# Patient Record
Sex: Male | Born: 1935 | Race: White | Hispanic: No | Marital: Single | Smoking: Never smoker
Health system: Southern US, Community
[De-identification: ages and names within clinical notes are randomized; demographics above are authoritative.]

## PROBLEM LIST (undated history)

## (undated) DIAGNOSIS — R0600 Dyspnea, unspecified: Secondary | ICD-10-CM

## (undated) DIAGNOSIS — R4182 Altered mental status, unspecified: Secondary | ICD-10-CM

## (undated) DIAGNOSIS — N390 Urinary tract infection, site not specified: Secondary | ICD-10-CM

## (undated) HISTORY — PX: HERNIA REPAIR: SHX51

---

## 2003-10-14 ENCOUNTER — Emergency Department (HOSPITAL_COMMUNITY): Admission: EM | Admit: 2003-10-14 | Discharge: 2003-10-14 | Payer: Self-pay | Admitting: Family Medicine

## 2004-03-20 ENCOUNTER — Emergency Department (HOSPITAL_COMMUNITY): Admission: EM | Admit: 2004-03-20 | Discharge: 2004-03-20 | Payer: Self-pay | Admitting: Family Medicine

## 2009-05-16 ENCOUNTER — Emergency Department (HOSPITAL_COMMUNITY): Admission: EM | Admit: 2009-05-16 | Discharge: 2009-05-16 | Payer: Self-pay | Admitting: Emergency Medicine

## 2009-12-15 ENCOUNTER — Emergency Department (HOSPITAL_COMMUNITY): Admission: EM | Admit: 2009-12-15 | Discharge: 2009-12-15 | Payer: Self-pay | Admitting: Emergency Medicine

## 2009-12-15 ENCOUNTER — Emergency Department (HOSPITAL_COMMUNITY): Admission: EM | Admit: 2009-12-15 | Discharge: 2009-12-15 | Payer: Self-pay | Admitting: Family Medicine

## 2010-01-04 ENCOUNTER — Ambulatory Visit: Payer: Self-pay | Admitting: Internal Medicine

## 2010-01-04 ENCOUNTER — Encounter: Payer: Self-pay | Admitting: Internal Medicine

## 2010-01-04 DIAGNOSIS — R9431 Abnormal electrocardiogram [ECG] [EKG]: Secondary | ICD-10-CM

## 2010-01-04 DIAGNOSIS — H9190 Unspecified hearing loss, unspecified ear: Secondary | ICD-10-CM | POA: Insufficient documentation

## 2010-01-04 DIAGNOSIS — N401 Enlarged prostate with lower urinary tract symptoms: Secondary | ICD-10-CM

## 2010-01-04 DIAGNOSIS — J984 Other disorders of lung: Secondary | ICD-10-CM | POA: Insufficient documentation

## 2010-01-04 LAB — CONVERTED CEMR LAB
ALT: 15 units/L (ref 0–53)
AST: 26 units/L (ref 0–37)
Alkaline Phosphatase: 50 units/L (ref 39–117)
BUN: 15 mg/dL (ref 6–23)
Basophils Relative: 0.9 % (ref 0.0–3.0)
Bilirubin, Direct: 0.1 mg/dL (ref 0.0–0.3)
CEA: 0.8 ng/mL (ref 0.0–5.0)
Chloride: 103 meq/L (ref 96–112)
Creatinine, Ser: 0.8 mg/dL (ref 0.4–1.5)
Eosinophils Relative: 2.1 % (ref 0.0–5.0)
GFR calc non Af Amer: 94.55 mL/min (ref >60)
Ketones, ur: 15 mg/dL
Leukocytes, UA: NEGATIVE
Monocytes Relative: 8.3 % (ref 3.0–12.0)
Neutrophils Relative %: 72.2 % (ref 43.0–77.0)
Platelets: 291 10*3/uL (ref 150.0–400.0)
Potassium: 4.5 meq/L (ref 3.5–5.1)
RBC: 3.89 M/uL — ABNORMAL LOW (ref 4.22–5.81)
Specific Gravity, Urine: 1.015 (ref 1.000–1.030)
Total Bilirubin: 1 mg/dL (ref 0.3–1.2)
Total Protein, Urine: NEGATIVE mg/dL
Total Protein: 6.8 g/dL (ref 6.0–8.3)
Urine Glucose: NEGATIVE mg/dL
WBC: 7.6 10*3/uL (ref 4.5–10.5)
pH: 6 (ref 5.0–8.0)

## 2010-01-08 ENCOUNTER — Ambulatory Visit: Payer: Self-pay | Admitting: Cardiology

## 2010-01-08 ENCOUNTER — Encounter: Payer: Self-pay | Admitting: Internal Medicine

## 2010-04-30 NOTE — Letter (Signed)
Summary: Results Follow-up Letter  [facility]  [address] [place], [place] [address]   Phone: [phone]  Fax: [phone]    01/08/2010  [address] [place], [place]  [address]  Dear Terrance Adams,   The following are the results of your recent test(s):  Test     Result     CT scan of chest   normal labs       normal   _________________________________________________________  Please call for an appointment as needed _________________________________________________________ _________________________________________________________ _________________________________________________________  Sincerely,  Sanda Linger MD [facility]

## 2010-04-30 NOTE — Assessment & Plan Note (Signed)
Summary: NEW/ MEDICARE / Ezequiel Essex /NWS  #   Vital Signs:  Patient profile:   77 year old male Height:      66 inches Weight:      132 pounds BMI:     21.38 O2 Sat:      93 % on Room air Temp:     97.3 degrees F oral Pulse rate:   82 / minute Pulse rhythm:   regular Resp:     16 per minute BP sitting:   120 / 82  (left arm) Cuff size:   regular  Vitals Entered By: Rock Nephew CMA (January 04, 2010 9:01 AM)  O2 Flow:  Room air CC: New to establish, Preventive Care  Vision Screening:Left eye with correction: 20 / 25 Right eye with correction: 20 / 25 Both eyes with correction: 20 / 25  Color vision testing: normal       CC:  New to establish and Preventive Care.  History of Present Illness: Here for Medicare AWV:  1.   Risk factors based on Past M, S, F history: yes, see notes 2.   Physical Activities: lives alone and independent, works part-time, very active 3.   Depression/mood: mood is good 4.   Hearing: impaired 5.   ADL's: does all of his own ADL's 6.   Fall Risk: none noted 7.   Home Safety: very good 8.   Height, weight, &visual acuity: done 9.   Counseling: yes 10.   Labs ordered based on risk factors: yes 11.           Referral Coordination: done 12.           Care Plan: completed 13.            Cognitive Assessment : he has some deficits but I don't think he would benefit from a med  Also, he had bronchitis about one month ago and he went to an [facility] and a chest xray showed a 1cm nodule in the right lung base that is new since Feb 2011. His URI symptoms have resolved but he wants to get a CT scan done to see if it is benign or malignant.  Preventive Screening-Counseling & Management  Alcohol-Tobacco     Alcohol drinks/day: 0     Smoking Status: never     Tobacco Counseling: not indicated; no tobacco use  Caffeine-Diet-Exercise     Does Patient Exercise: yes     Exercise Counseling: to improve exercise regimen  Hep-HIV-STD-Contraception     Hepatitis  Risk: no risk noted     HIV Risk: no risk noted     STD Risk: no risk noted     Dental Visit-last 6 months yes     Dental Care Counseling: to seek dental care; no dental care within six months     TSE monthly: yes     Testicular SE Education/Counseling to perform regular STE     Sun Exposure-Excessive: no  Safety-Violence-Falls     Seat Belt Use: yes     Helmet Use: n/a     Firearms in the Home: firearms in the home     Firearm Counseling: to practice firearm safety     Smoke Detectors: no     Smoke Detector Counseling: yes     Violence in the Home: no risk noted     Sexual Abuse: no     Fall Risk: none      Sexual History:  not active.  Drug Use:  no.        Blood Transfusions:  no.    Clinical Review Panels:  Immunizations   Last Tetanus Booster:  Td (01/04/2010)   Last Flu Vaccine:  Fluvax 3+ (01/04/2010)  Diabetes Management   Last Flu Vaccine:  Fluvax 3+ (01/04/2010)   Current Medications (verified): 1)  Multi Vitamins  Allergies (verified): No Known Drug Allergies  Past History:  Past Medical History: Unremarkable  Past Surgical History: Appendectomy Inguinal herniorrhaphy Tonsillectomy  Family History: Reviewed history and no changes required. none  Social History: Reviewed history and no changes required. 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  The patient denies anorexia, fever, weight loss, weight gain, hoarseness, chest pain, syncope,  dyspnea on exertion, peripheral edema, headaches, hemoptysis, abdominal pain, melena, hematochezia, severe indigestion/heartburn, hematuria, suspicious skin lesions, transient blindness, difficulty walking, depression, enlarged lymph nodes, angioedema, and testicular masses.   General:  Denies chills, fatigue, fever, loss of appetite, malaise, sleep disorder, sweats, weakness, and weight loss. ENT:  Complains of decreased hearing; denies difficulty swallowing, ear discharge, earache, hoarseness, nasal congestion, nosebleeds, postnasal drainage, ringing in ears, sinus pressure, and sore throat. Resp:  Complains of cough; denies chest discomfort, chest pain with inspiration, coughing up blood, morning headaches, pleuritic, shortness of breath, sputum productive, and wheezing. GI:  Denies abdominal pain, bloody stools, change in bowel habits, constipation, diarrhea, loss of appetite, nausea, vomiting, vomiting blood, and yellowish skin color. GU:  Denies discharge, dysuria, hematuria, nocturia, urinary frequency, and urinary hesitancy. Endo:  Denies cold intolerance, excessive hunger, excessive thirst, excessive urination, heat intolerance, polyuria, and weight change.  Physical Exam  General:  alert, well-developed, well-nourished, well-hydrated, appropriate dress, normal appearance, healthy-appearing, cooperative to examination, and good hygiene.   Head:  normocephalic, atraumatic, no abnormalities observed, no abnormalities palpated, and no alopecia.   Eyes:  vision grossly intact, pupils equal, pupils round, and pupils reactive to light.   Ears:  R ear normal and L ear normal.   Nose:  External nasal examination shows no deformity or inflammation. Nasal mucosa are pink and moist without lesions or exudates. Mouth:  Oral mucosa and oropharynx without lesions or exudates.  Teeth in good repair. Neck:  supple, full ROM, no masses, no thyromegaly, no thyroid nodules or tenderness, no JVD, normal carotid  upstroke, no carotid bruits, no cervical lymphadenopathy, and no neck tenderness.   Chest Wall:  No deformities, masses, tenderness or gynecomastia noted. Breasts:  No masses or gynecomastia noted Lungs:  normal respiratory effort, no intercostal retractions, no accessory muscle use, normal breath sounds, no dullness, no fremitus, no crackles, and no wheezes.   Heart:  normal rate, regular rhythm, no murmur, no gallop, no rub, and no JVD.   Abdomen:  soft, non-tender, normal bowel sounds, no distention, no masses, no guarding, no rigidity, no rebound tenderness, no abdominal hernia, no inguinal hernia, no hepatomegaly, no splenomegaly, and abdominal scar(s).   Rectal:  no external abnormalities,  normal sphincter tone, no masses, no tenderness, no fissures, no fistulae, no perianal rash, and external hemorrhoid(s).   Genitalia:  circumcised, no hydrocele, no varicocele, no scrotal masses, no cutaneous lesions, no urethral discharge, R testes atrophic, and L testes atrophic.   Prostate:  no nodules, no asymmetry, no induration, and 2+ enlarged.   Msk:  No deformity or scoliosis noted of thoracic or lumbar spine.   Pulses:  R and L carotid,radial,femoral,dorsalis pedis and posterior tibial pulses are full and equal bilaterally Extremities:  No clubbing, cyanosis, edema, or deformity noted with normal full range of motion of all joints.   Neurologic:  No cranial nerve deficits noted. Station and gait are normal. Plantar reflexes are down-going bilaterally. DTRs are symmetrical throughout. Sensory, motor and coordinative functions appear intact. Skin:  turgor normal, color normal, no rashes, no suspicious lesions, no ecchymoses, no petechiae, no purpura, no ulcerations, no edema, solar damage, actinic keratosis, and seborrheic keratosis.   Cervical Nodes:  no anterior cervical adenopathy and no posterior cervical adenopathy.   Axillary Nodes:  no R axillary adenopathy and no L axillary adenopathy.     Inguinal Nodes:  no R inguinal adenopathy and no L inguinal adenopathy.   Psych:  Oriented X3, normally interactive, good eye contact, not anxious appearing, not depressed appearing, not agitated, not suicidal, easily distracted, poor concentration, and memory impairment.   Additional Exam:  EKG shows a benign variant but no pathology   Impression & Recommendations:  Problem # 1:  UNSPECIFIED HEARING LOSS (ICD-389.9) Assessment New  Orders: Audiology (Audio)  Problem # 2:  HYPERTROPHY PROSTATE W/UR OBST & OTH LUTS (ICD-600.01) Assessment: New  Orders: Venipuncture (29528) TLB-BMP (Basic Metabolic Panel-BMET) (80048-METABOL) TLB-CBC Platelet - w/Differential (85025-CBCD) TLB-Hepatic/Liver Function Pnl (80076-HEPATIC) TLB-TSH (Thyroid Stimulating Hormone) (84443-TSH) TLB-PSA (Prostate Specific Antigen) (84153-PSA) T-CEA (41324-40102) TLB-Udip w/ Micro (81001-URINE) DRE (V2536) Prostate / PSA (Medicare) (G0103) Hemoccult Guaiac-1 spec.(in office) (82270)  Problem # 3:  PULMONARY NODULE (ICD-518.89) Assessment: New will check labs for malignancy and schedule him for a CT with contrast if his renal function is good Orders: TLB-Udip w/ Micro (81001-URINE) DRE (U4403) Prostate / PSA (Medicare) (G0103) Hemoccult Guaiac-1 spec.(in office) (82270) Radiology Referral (Radiology)  Problem # 4:  ROUTINE GENERAL MEDICAL EXAM@HEALTH  CARE FACL (ICD-V70.0) Assessment: New  Orders: Venipuncture (47425) TLB-BMP (Basic Metabolic Panel-BMET) (80048-METABOL) TLB-CBC Platelet - w/Differential (85025-CBCD) TLB-Hepatic/Liver Function Pnl (80076-HEPATIC) TLB-TSH (Thyroid Stimulating Hormone) (84443-TSH) TLB-PSA (Prostate Specific Antigen) (84153-PSA) T-CEA (95638-75643) TLB-Udip w/ Micro (81001-URINE) DRE (P2951) Prostate / PSA (Medicare) (G0103) Hemoccult Guaiac-1 spec.(in office) (82270) Medicare -1st Annual Wellness Visit [id number])  Td Booster: Td (01/04/2010)   Flu Vax: Fluvax 3+  (01/04/2010)    Discussed using sunscreen, use of alcohol, drug use, self testicular exam, routine dental care, routine eye care, routine physical exam, seat belts, multiple vitamins, and recommendations for immunizations.  Discussed exercise and checking cholesterol.  Discussed gun safety. Also recommend checking PSA.  Problem # 5:  ABNORMAL ELECTROCARDIOGRAM (ICD-794.31) Assessment: New benign finding and no symptoms to suggest heart disease Orders: EKG w/ Interpretation (93000)  Complete Medication List: 1)  Multi Vitamins   Other Orders: Flu Vaccine 53yrs + MEDICARE PATIENTS (S0630) Administration Flu vaccine - MCR (G0008) TD Toxoids IM 7 YR + (16010) Admin 1st Vaccine (93235)  Colorectal Screening:  Current Recommendations:    Hemoccult: NEG X 1 today  PSA Screening:    Reviewed PSA screening recommendations: PSA ordered  Immunization & Chemoprophylaxis:    Tetanus vaccine: Td  (01/04/2010)  Influenza vaccine: Fluvax 3+  (01/04/2010)  Patient Instructions: 1)  Please schedule a follow-up appointment in 2 weeks.   Marland Kitchenlbmedflu Flu Vaccine Consent Questions     Do you have a history of severe allergic reactions to this vaccine? no    Any prior history of allergic reactions to egg and/or gelatin? no    Do you have a sensitivity to the preservative Thimersol? no    Do you have a past history of Guillan-Barre Syndrome? no    Do you currently have an acute febrile illness? no    Have you ever had a severe reaction to latex? no    Vaccine information given and explained to patient? yes    Are you currently pregnant? no    Lot Number:[id number]   Exp Date:09/28/2010   Site Given  Left Deltoid IM   Immunizations Administered:  Tetanus Vaccine:    Vaccine Type: Td    Site: right deltoid    Mfr: Sanofi Pasteur    Dose: 0.5 ml    Route: IM    Given by: Rock Nephew CMA    Exp. Date: 05/02/2011    Lot #: [id number]    VIS given: 02/16/08 version given January 04, 2010.

## 2011-03-15 ENCOUNTER — Emergency Department (HOSPITAL_COMMUNITY): Payer: Worker's Compensation

## 2011-03-15 ENCOUNTER — Emergency Department (HOSPITAL_COMMUNITY)
Admission: EM | Admit: 2011-03-15 | Discharge: 2011-03-15 | Disposition: A | Discharge status: Home / Self Care | Payer: Worker's Compensation | Attending: Emergency Medicine | Admitting: Emergency Medicine

## 2011-03-15 DIAGNOSIS — S01501A Unspecified open wound of lip, initial encounter: Secondary | ICD-10-CM | POA: Insufficient documentation

## 2011-03-15 DIAGNOSIS — S01119A Laceration without foreign body of unspecified eyelid and periocular area, initial encounter: Secondary | ICD-10-CM | POA: Insufficient documentation

## 2011-03-15 DIAGNOSIS — W010XXA Fall on same level from slipping, tripping and stumbling without subsequent striking against object, initial encounter: Secondary | ICD-10-CM | POA: Insufficient documentation

## 2011-03-15 DIAGNOSIS — S0180XA Unspecified open wound of other part of head, initial encounter: Secondary | ICD-10-CM | POA: Insufficient documentation

## 2011-03-15 DIAGNOSIS — S0083XA Contusion of other part of head, initial encounter: Secondary | ICD-10-CM

## 2011-03-15 DIAGNOSIS — H5789 Other specified disorders of eye and adnexa: Secondary | ICD-10-CM | POA: Insufficient documentation

## 2011-03-15 DIAGNOSIS — S0181XA Laceration without foreign body of other part of head, initial encounter: Secondary | ICD-10-CM

## 2011-03-15 DIAGNOSIS — R079 Chest pain, unspecified: Secondary | ICD-10-CM | POA: Insufficient documentation

## 2011-03-15 DIAGNOSIS — W19XXXA Unspecified fall, initial encounter: Secondary | ICD-10-CM

## 2011-03-15 NOTE — ED Provider Notes (Signed)
History     CSN: [id number] Arrival date & time: 03/15/2011  2:21 PM   First MD Initiated Contact with Patient 03/15/11 1513      Chief Complaint  Patient presents with  . Fall    (Consider location/radiation/quality/duration/timing/severity/associated sxs/prior treatment) HPI Pt. Was at work an tripped over a rug and fell onto carpet. Laceration above his rt. Eyebrow and also on his rt. Lip. Breathing controlled. No LOC. Pt. Got up and drove home. And then decided to come to ED. Pt. Denies any pain or discomfort  History reviewed. No pertinent past medical history.  History reviewed. No pertinent past surgical history.  History reviewed. No pertinent family history.  History  Substance Use Topics  . Smoking status: Never Smoker   . Smokeless tobacco: Not on file  . Alcohol Use: No      Review of Systems  All other systems reviewed and are negative.    Allergies  Review of patient's allergies indicates no known allergies.  Home Medications  No current outpatient prescriptions on file.  BP 160/76  Pulse 85  Temp(Src) 98.4 F (36.9 C) (Oral)  Resp 18  Ht 5\' 5"  (1.651 m)  Wt 124 lb (56.246 kg)  BMI 20.63 kg/m2  SpO2 98%  Physical Exam  Nursing note and vitals reviewed. Constitutional: He is oriented to person, place, and time. He appears well-developed and well-nourished. No distress.  HENT:  Head: Normocephalic and atraumatic.  Eyes: Conjunctivae are normal. Pupils are equal, round, and reactive to light. Right eye exhibits normal extraocular motion. Left eye exhibits normal extraocular motion.    Neck: Normal range of motion.  Cardiovascular: Normal rate and intact distal pulses.   Pulmonary/Chest: No respiratory distress.    Abdominal: Normal appearance. He exhibits no distension.  Musculoskeletal: Normal range of motion.  Neurological: He is alert and oriented to person, place, and time. No cranial nerve deficit.  Skin: Skin is warm and dry. No  rash noted.  Psychiatric: He has a normal mood and affect. His behavior is normal.    ED Course  Procedures (including critical care time)  Labs Reviewed - No data to display Dg Ribs Unilateral W/chest Right  03/15/2011  *RADIOLOGY REPORT*  Clinical Data: Fall.  Right rib pain.  RIGHT RIBS AND CHEST - 3+ VIEW  Comparison: 01/08/2010  Findings: Heart size is normal.  No pleural effusion or pulmonary edema.  There is asymmetric elevation of the left hemidiaphragm.  This is unchanged from previous exam.  Coarsened interstitial markings are noted bilaterally. No superimposed airspace consolidation.  Chronic right posterior lateral rib fracture deformities are noted.  IMPRESSION:  1.  No acute cardiopulmonary abnormalities. 2.  No acute displaced rib fractures identified.  Chronic right posterior rib fracture deformities appear unchanged from previous exam.  Original Report Authenticated By: Rosealee Albee, M.D.     No diagnosis found.    MDM          Nelia Shi, MD 03/15/11 [phone]

## 2011-03-15 NOTE — ED Notes (Signed)
Pt. Was at work an tripped over a rug and fell onto carpet.  Laceration above his rt. Eyebrow and also on his rt. Lip.   Breathing controlled. No LOC. Pt. Got up and drove home. And then decided to come to ED.   Pt. Denies any pain or discomfort

## 2011-03-27 ENCOUNTER — Encounter (HOSPITAL_COMMUNITY): Payer: Self-pay | Admitting: Emergency Medicine

## 2011-03-27 ENCOUNTER — Emergency Department (HOSPITAL_COMMUNITY): Payer: Medicare Other

## 2011-03-27 ENCOUNTER — Observation Stay (HOSPITAL_COMMUNITY)
Admission: EM | Admit: 2011-03-27 | Discharge: 2011-03-31 | Disposition: A | Discharge status: Home / Self Care | Payer: Medicare Other | Attending: Internal Medicine | Admitting: Internal Medicine

## 2011-03-27 DIAGNOSIS — N390 Urinary tract infection, site not specified: Secondary | ICD-10-CM | POA: Insufficient documentation

## 2011-03-27 DIAGNOSIS — J984 Other disorders of lung: Secondary | ICD-10-CM

## 2011-03-27 DIAGNOSIS — R4182 Altered mental status, unspecified: Principal | ICD-10-CM | POA: Insufficient documentation

## 2011-03-27 DIAGNOSIS — N401 Enlarged prostate with lower urinary tract symptoms: Secondary | ICD-10-CM

## 2011-03-27 DIAGNOSIS — R9431 Abnormal electrocardiogram [ECG] [EKG]: Secondary | ICD-10-CM

## 2011-03-27 DIAGNOSIS — Z9181 History of falling: Secondary | ICD-10-CM | POA: Insufficient documentation

## 2011-03-27 DIAGNOSIS — R269 Unspecified abnormalities of gait and mobility: Secondary | ICD-10-CM | POA: Insufficient documentation

## 2011-03-27 DIAGNOSIS — H9190 Unspecified hearing loss, unspecified ear: Secondary | ICD-10-CM

## 2011-03-27 LAB — CBC
HCT: 34.5 % — ABNORMAL LOW (ref 39.0–52.0)
MCH: 30.6 pg (ref 26.0–34.0)
MCV: 89.4 fL (ref 78.0–100.0)
RBC: 3.86 MIL/uL — ABNORMAL LOW (ref 4.22–5.81)
WBC: 9.1 10*3/uL (ref 4.0–10.5)

## 2011-03-27 LAB — DIFFERENTIAL
Eosinophils Absolute: 0.1 10*3/uL (ref 0.0–0.7)
Eosinophils Relative: 1 % (ref 0–5)
Lymphocytes Relative: 14 % (ref 12–46)
Lymphs Abs: 1.2 10*3/uL (ref 0.7–4.0)
Monocytes Absolute: 1.2 10*3/uL — ABNORMAL HIGH (ref 0.1–1.0)
Monocytes Relative: 13 % — ABNORMAL HIGH (ref 3–12)

## 2011-03-27 LAB — BASIC METABOLIC PANEL
BUN: 18 mg/dL (ref 6–23)
CO2: 26 mEq/L (ref 19–32)
Calcium: 8.9 mg/dL (ref 8.4–10.5)
Creatinine, Ser: 1.17 mg/dL (ref 0.50–1.35)
GFR calc non Af Amer: 54 mL/min — ABNORMAL LOW (ref >90)
Glucose, Bld: 130 mg/dL — ABNORMAL HIGH (ref 70–99)
Sodium: 137 mEq/L (ref 135–145)

## 2011-03-27 LAB — URINALYSIS, ROUTINE W REFLEX MICROSCOPIC
Glucose, UA: NEGATIVE mg/dL
Hgb urine dipstick: NEGATIVE
Nitrite: NEGATIVE
Protein, ur: 30 mg/dL — AB

## 2011-03-27 LAB — URINE MICROSCOPIC-ADD ON

## 2011-03-27 MED ORDER — DEXTROSE 5 % IV SOLN
1.0000 g | Freq: Once | INTRAVENOUS | Status: AC
  Administered 2011-03-27: 1 g via INTRAVENOUS
  Filled 2011-03-27: qty 10

## 2011-03-27 MED ORDER — SODIUM CHLORIDE 0.9 % IV BOLUS (SEPSIS)
500.0000 mL | Freq: Once | INTRAVENOUS | Status: AC
  Administered 2011-03-27: 500 mL via INTRAVENOUS

## 2011-03-27 MED ORDER — SODIUM CHLORIDE 0.9 % IV SOLN
INTRAVENOUS | Status: DC
  Administered 2011-03-27: 22:00:00 via INTRAVENOUS
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
## 2011-03-27 NOTE — ED Notes (Signed)
Pt lives by himself; no meds; no signs hx.
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
## 2011-03-27 NOTE — ED Notes (Signed)
RN entered room. Pt was trying to get out of bed and was sitting on the edge. IV out. RN cleaned up pt and will start a new line to continue antibiotics. Door will remain open and other staff made aware to keep eye on him.

## 2011-03-27 NOTE — ED Notes (Signed)
Patient transported to X-ray 

## 2011-03-28 ENCOUNTER — Encounter (HOSPITAL_COMMUNITY): Payer: Self-pay | Admitting: Internal Medicine

## 2011-03-28 ENCOUNTER — Observation Stay (HOSPITAL_COMMUNITY): Payer: Medicare Other

## 2011-03-28 DIAGNOSIS — R4182 Altered mental status, unspecified: Secondary | ICD-10-CM | POA: Diagnosis present

## 2011-03-28 DIAGNOSIS — N390 Urinary tract infection, site not specified: Secondary | ICD-10-CM | POA: Diagnosis present

## 2011-03-28 LAB — RAPID URINE DRUG SCREEN, HOSP PERFORMED
Amphetamines: NOT DETECTED
Barbiturates: NOT DETECTED
Benzodiazepines: NOT DETECTED
Cocaine: NOT DETECTED
Opiates: NOT DETECTED
Tetrahydrocannabinol: NOT DETECTED

## 2011-03-28 LAB — CBC
HCT: 34.6 % — ABNORMAL LOW (ref 39.0–52.0)
Platelets: 220 10*3/uL (ref 150–400)
RBC: 3.86 MIL/uL — ABNORMAL LOW (ref 4.22–5.81)
RDW: 12.8 % (ref 11.5–15.5)
WBC: 8.5 10*3/uL (ref 4.0–10.5)

## 2011-03-28 LAB — COMPREHENSIVE METABOLIC PANEL
Albumin: 3 g/dL — ABNORMAL LOW (ref 3.5–5.2)
Alkaline Phosphatase: 73 U/L (ref 39–117)
BUN: 13 mg/dL (ref 6–23)
Creatinine, Ser: 0.96 mg/dL (ref 0.50–1.35)
GFR calc Af Amer: 84 mL/min — ABNORMAL LOW (ref >90)
Glucose, Bld: 109 mg/dL — ABNORMAL HIGH (ref 70–99)
Potassium: 3.8 mEq/L (ref 3.5–5.1)
Total Bilirubin: 0.3 mg/dL (ref 0.3–1.2)
Total Protein: 6.2 g/dL (ref 6.0–8.3)

## 2011-03-28 LAB — CARDIAC PANEL(CRET KIN+CKTOT+MB+TROPI)
CK, MB: 3.4 ng/mL (ref 0.3–4.0)
CK, MB: 3.4 ng/mL (ref 0.3–4.0)
Relative Index: 1.9 (ref 0.0–2.5)
Troponin I: <0.3 ng/mL (ref <0.30)
Troponin I: <0.3 ng/mL (ref <0.30)
Troponin I: <0.3 ng/mL (ref <0.30)

## 2011-03-28 LAB — URINE CULTURE: Culture  Setup Time: 201212272205

## 2011-03-28 MED ORDER — CIPROFLOXACIN IN D5W 400 MG/200ML IV SOLN
400.0000 mg | Freq: Two times a day (BID) | INTRAVENOUS | Status: DC
  Administered 2011-03-28 – 2011-03-31 (×7): 400 mg via INTRAVENOUS
  Filled 2011-03-28 (×10): qty 200

## 2011-03-28 MED ORDER — SODIUM CHLORIDE 0.9 % IV SOLN
INTRAVENOUS | Status: DC
  Administered 2011-03-28 – 2011-03-31 (×4): via INTRAVENOUS

## 2011-03-28 MED ORDER — ONDANSETRON HCL 4 MG PO TABS: 4.0000 mg | ORAL_TABLET | Freq: Four times a day (QID) | ORAL | Status: DC | PRN

## 2011-03-28 MED ORDER — ONDANSETRON HCL 4 MG/2ML IJ SOLN: 4.0000 mg | Freq: Four times a day (QID) | INTRAMUSCULAR | Status: DC | PRN

## 2011-03-28 MED ORDER — ACETAMINOPHEN 325 MG PO TABS
650.0000 mg | ORAL_TABLET | Freq: Four times a day (QID) | ORAL | Status: DC | PRN
  Administered 2011-03-29: 650 mg via ORAL
  Filled 2011-03-28 (×2): qty 2

## 2011-03-28 MED ORDER — ACETAMINOPHEN 650 MG RE SUPP: 650.0000 mg | Freq: Four times a day (QID) | RECTAL | Status: DC | PRN

## 2011-03-28 NOTE — ED Notes (Signed)
Admitting MD at bedside.

## 2011-03-28 NOTE — ED Notes (Signed)
2003-01 READY

## 2011-03-28 NOTE — ED Notes (Signed)
Patient is resting comfortably. 

## 2011-03-28 NOTE — H&P (Signed)
Terrance Adams is an 77 y.o. male.   PCP - Not known check with patient when he is more alert. Chief Complaint: Altered mental status HPI: 77 year old male with no significant past medical history was brought in the ER patient was driving the car improperly and the police stopped him and brought him to the ER. He works and at work place he was found to be mildly confused so his supervisor was concerned and followed him and found that his car was swerving. Eventually the police was called and the patient was brought ER. Patient also had a fall last week and was brought ER. The patient remembers that fall he said he had slipped and fell after tripping. Today again he has CT head which does not show any acute. He recalls the incident when the police brought him in. At this moment he is lethargic but oriented to time place and person. He falls asleep very fast. In the ER labs show that he probably has a UTI.  History reviewed. No pertinent past medical history.  History reviewed. No pertinent past surgical history.  History reviewed. No pertinent family history. Social History:  reports that he has never smoked. He does not have any smokeless tobacco history on file. He reports that he does not drink alcohol or use illicit drugs.  Allergies: No Known Allergies  Medications Prior to Admission  Medication Dose Route Frequency Provider Last Rate Last Dose  . 0.9 %  sodium chloride infusion   Intravenous Continuous Flint Melter, MD 125 mL/hr at 03/27/11 2210    . cefTRIAXone (ROCEPHIN) 1 g in dextrose 5 % 50 mL IVPB  1 g Intravenous Once Flint Melter, MD   1 g at 03/27/11 2303  . sodium chloride 0.9 % bolus 500 mL  500 mL Intravenous Once Flint Melter, MD   500 mL at 03/27/11 2031   No current outpatient prescriptions on file as of 03/27/2011.    Results for orders placed during the hospital encounter of 03/27/11 (from the past 48 hour(s))  CBC     Status: Abnormal   Collection Time   03/27/11  8:17 PM      Component Value Range Comment   WBC 9.1  4.0 - 10.5 (K/uL)    RBC 3.86 (*) 4.22 - 5.81 (MIL/uL)    Hemoglobin 11.8 (*) 13.0 - 17.0 (g/dL)    HCT 04.5 (*) 40.9 - 52.0 (%)    MCV 89.4  78.0 - 100.0 (fL)    MCH 30.6  26.0 - 34.0 (pg)    MCHC 34.2  30.0 - 36.0 (g/dL)    RDW 81.1  91.4 - 78.2 (%)    Platelets 247  150 - 400 (K/uL)   DIFFERENTIAL     Status: Abnormal   Collection Time   03/27/11  8:17 PM      Component Value Range Comment   Neutrophils Relative 72  43 - 77 (%)    Neutro Abs 6.6  1.7 - 7.7 (K/uL)    Lymphocytes Relative 14  12 - 46 (%)    Lymphs Abs 1.2  0.7 - 4.0 (K/uL)    Monocytes Relative 13 (*) 3 - 12 (%)    Monocytes Absolute 1.2 (*) 0.1 - 1.0 (K/uL)    Eosinophils Relative 1  0 - 5 (%)    Eosinophils Absolute 0.1  0.0 - 0.7 (K/uL)    Basophils Relative 1  0 - 1 (%)    Basophils Absolute  0.1  0.0 - 0.1 (K/uL)   BASIC METABOLIC PANEL     Status: Abnormal   Collection Time   03/27/11  8:17 PM      Component Value Range Comment   Sodium 137  135 - 145 (mEq/L)    Potassium 3.8  3.5 - 5.1 (mEq/L)    Chloride 102  96 - 112 (mEq/L)    CO2 26  19 - 32 (mEq/L)    Glucose, Bld 130 (*) 70 - 99 (mg/dL)    BUN 18  6 - 23 (mg/dL)    Creatinine, Ser 9.62  0.50 - 1.35 (mg/dL)    Calcium 8.9  8.4 - 10.5 (mg/dL)    GFR calc non Af Amer 54 (*) >90 (mL/min)    GFR calc Af Amer 63 (*) >90 (mL/min)   URINALYSIS, ROUTINE W REFLEX MICROSCOPIC     Status: Abnormal   Collection Time   03/27/11  9:37 PM      Component Value Range Comment   Color, Urine YELLOW  YELLOW     APPearance CLEAR  CLEAR     Specific Gravity, Urine 1.024  1.005 - 1.030     pH 6.5  5.0 - 8.0     Glucose, UA NEGATIVE  NEGATIVE (mg/dL)    Hgb urine dipstick NEGATIVE  NEGATIVE     Bilirubin Urine SMALL (*) NEGATIVE     Ketones, ur NEGATIVE  NEGATIVE (mg/dL)    Protein, ur 30 (*) NEGATIVE (mg/dL)    Urobilinogen, UA 1.0  0.0 - 1.0 (mg/dL)    Nitrite NEGATIVE  NEGATIVE      Leukocytes, UA TRACE (*) NEGATIVE    URINE MICROSCOPIC-ADD ON     Status: Normal   Collection Time   03/27/11  9:37 PM      Component Value Range Comment   Squamous Epithelial / LPF RARE  RARE     WBC, UA 7-10  <3 (WBC/hpf)    RBC / HPF 0-2  <3 (RBC/hpf)    Bacteria, UA RARE  RARE     Dg Chest 2 View  03/27/2011  *RADIOLOGY REPORT*  Clinical Data: Chest pain and altered level of consciousness.  CHEST - 2 VIEW  Comparison: 03/15/2011  Findings: Lung volumes are reduced compared to the prior study with bibasilar atelectasis present.  There is chronic elevation of the left hemidiaphragm.  No overt pulmonary consolidation or edema.  No pleural fluid identified.  The heart size is stable.  Stable old right-sided rib fractures.  IMPRESSION: Low volumes with bibasilar atelectasis.  Original Report Authenticated By: Reola Calkins, M.D.   Ct Head Wo Contrast  03/27/2011  *RADIOLOGY REPORT*  Clinical Data: Acute mental status change.  Fall.  CT HEAD WITHOUT CONTRAST  Technique:  Contiguous axial images were obtained from the base of the skull through the vertex without contrast.  Comparison: 03/15/2011  Findings: There is diffuse patchy low density throughout the subcortical and periventricular white matter consistent with chronic small vessel ischemic change.  There is prominence of the sulci and ventricles consistent with brain atrophy.  There is no evidence for acute brain infarct, hemorrhage or mass.  There is partial opacification of the ethmoid air cells.  The mastoid air cells are clear.  The skull appears intact.  IMPRESSION:  1. No acute intracranial abnormalities. 2. Small vessel ischemic change and brain atrophy.  Original Report Authenticated By: Rosealee Albee, M.D.    Review of Systems  Constitutional: Negative.   HENT:  Negative.   Eyes: Negative.   Respiratory: Negative.   Cardiovascular: Negative.   Gastrointestinal: Negative.   Genitourinary: Negative.   Musculoskeletal:  Negative.   Skin: Negative.   Neurological:       Confusion   Endo/Heme/Allergies: Negative.   Psychiatric/Behavioral: Negative.     Blood pressure 148/65, pulse 81, temperature 97.9 F (36.6 C), temperature source Oral, resp. rate 22, SpO2 97.00%. Physical Exam  Constitutional: He appears well-developed and well-nourished. No distress.  HENT:  Head: Normocephalic.  Right Ear: External ear normal.  Left Ear: External ear normal.  Nose: Nose normal.  Mouth/Throat: Oropharynx is clear and moist.       Right periorbital hematoma.  Eyes:       Right periorbital hematoma.  Neck: Normal range of motion. Neck supple.  Cardiovascular: Normal rate, regular rhythm and normal heart sounds.   Respiratory: Effort normal and breath sounds normal. No respiratory distress. He has no wheezes.  GI: Soft. Bowel sounds are normal. He exhibits no distension. There is no tenderness. There is no rebound.  Neurological: He is alert.       Oriented to name place and person. Moves upper and lower limbs.  Skin: Skin is warm and dry. No rash noted. He is not diaphoretic. No erythema.  Psychiatric: His behavior is normal.     Assessment/Plan #1. Altered mental status - patient is lethargic though he is oriented. He follows commands. We will check urine drug screen, ammonia level we'll also get an EEG and MRI of the brain. We'll get a physical therapy consult. I tried to reach his brother threw the phone on the number provided but was unable to reach. Patient states he lives alone. I have also consulted Child psychotherapist. #2. UTI - get urine cultures continue ciprofloxacin until cultures obtained.  CODE STATUS - full code.  Royce Stegman N. 03/28/2011, 1:12 AM

## 2011-03-28 NOTE — Progress Notes (Signed)
Subjective: Sleeping, but easily rousable, although falls asleep again, soon after. Appears disoriented.  Objective: Vital signs in last 24 hours: Temp:  [97.7 F (36.5 C)-97.9 F (36.6 C)] 97.7 F (36.5 C) (12/28 0234) Pulse Rate:  [74-91] 79  (12/28 0234) Resp:  [18-22] 18  (12/28 0234) BP: (137-157)/(56-74) 157/74 mmHg (12/28 0234) SpO2:  [96 %-97 %] 97 % (12/28 0234) Weight change:  Last BM Date: 03/27/11  Intake/Output from previous day: 12/27 0701 - 12/28 0700 In: 473.8 [P.O.:250; I.V.:223.8] Out: -  Total I/O In: 240 [P.O.:240] Out: -    Physical Exam: General: Comfortable, somnolent, not short of breath at rest.  HEENT:  Mild clinical pallor, no jaundice, no conjunctival injection or discharge. Hydration appear fair. NECK:  Supple, JVP not seen, no carotid bruits, no palpable lymphadenopathy, no palpable goiter. CHEST:  Clinically clear to auscultation, no wheezes, no crackles. HEART:  Sounds 1 and 2 heard, normal, regular, no murmurs. ABDOMEN:  Full, soft, non-tender, no palpable organomegaly, no palpable masses, normal bowel sounds. GENITALIA:  Not examined. LOWER EXTREMITIES:  No pitting edema, palpable peripheral pulses. MUSCULOSKELETAL SYSTEM:  Generalized osteoarthritic changes, otherwise, normal. CENTRAL NERVOUS SYSTEM: Moving all limbs, unable to appreciate focal neurologic deficit, due to lack of cooperation..  Lab Results:  Basename 03/28/11 0416 03/27/11 2017  WBC 8.5 9.1  HGB 11.8* 11.8*  HCT 34.6* 34.5*  PLT 220 247    Basename 03/28/11 0416 03/27/11 2017  NA 136 137  K 3.8 3.8  CL 102 102  CO2 25 26  GLUCOSE 109* 130*  BUN 13 18  CREATININE 0.96 1.17  CALCIUM 8.2* 8.9   No results found for this or any previous visit (from the past 240 hour(s)).   Studies/Results: Dg Chest 2 View  03/27/2011  *RADIOLOGY REPORT*  Clinical Data: Chest pain and altered level of consciousness.  CHEST - 2 VIEW  Comparison: 03/15/2011  Findings: Lung  volumes are reduced compared to the prior study with bibasilar atelectasis present.  There is chronic elevation of the left hemidiaphragm.  No overt pulmonary consolidation or edema.  No pleural fluid identified.  The heart size is stable.  Stable old right-sided rib fractures.  IMPRESSION: Low volumes with bibasilar atelectasis.  Original Report Authenticated By: Reola Calkins, M.D.   Ct Head Wo Contrast  03/27/2011  *RADIOLOGY REPORT*  Clinical Data: Acute mental status change.  Fall.  CT HEAD WITHOUT CONTRAST  Technique:  Contiguous axial images were obtained from the base of the skull through the vertex without contrast.  Comparison: 03/15/2011  Findings: There is diffuse patchy low density throughout the subcortical and periventricular white matter consistent with chronic small vessel ischemic change.  There is prominence of the sulci and ventricles consistent with brain atrophy.  There is no evidence for acute brain infarct, hemorrhage or mass.  There is partial opacification of the ethmoid air cells.  The mastoid air cells are clear.  The skull appears intact.  IMPRESSION:  1. No acute intracranial abnormalities. 2. Small vessel ischemic change and brain atrophy.  Original Report Authenticated By: Rosealee Albee, M.D.    Medications: Scheduled Meds:   . cefTRIAXone (ROCEPHIN) IVPB 1 gram/50 mL D5W  1 g Intravenous Once  . ciprofloxacin  400 mg Intravenous BID  . sodium chloride  500 mL Intravenous Once   Continuous Infusions:   . sodium chloride 125 mL/hr at 03/27/11 2210  . sodium chloride 75 mL/hr at 03/28/11 0332   PRN Meds:.acetaminophen, acetaminophen, ondansetron (ZOFRAN)  IV, ondansetron  Assessment/Plan:  Principal Problem:  *Altered mental status: Precise etiology is unclear at this time. Patient has mild pyuria, is afebrile, has no neck stiffness. Agree with work up, including EEG, Brain MRI and septic screen. Perhaps, patient has had a CVA. Active Problems:  1. UTI  (lower urinary tract infection): As described above, patient has only mild pyuria, so this is likely to be early UTI, and does not fully explain AMS or gait instability. Continue Antibiotics for now, Day# 2/7 and await urine culture.  Comment: Will continue maintenance iv fluids. Patient will need PT/OT evaluation.    LOS: 1 day   Sritha Chauncey,CHRISTOPHER 03/28/2011, 10:37 AM

## 2011-03-28 NOTE — Progress Notes (Signed)
*  PRELIMINARY RESULTS* EEG has been performed.  Terrance Adams 03/28/2011, 11:13 AM

## 2011-03-28 NOTE — Progress Notes (Signed)
Physical Therapy Evaluation Patient Details Name: Terrance Adams MRN: [id number] DOB: July 18, 1921 Today's Date: 03/28/2011  Problem List:  Patient Active Problem List  Diagnoses  . UNSPECIFIED HEARING LOSS  . PULMONARY NODULE  . HYPERTROPHY PROSTATE W/UR OBST & OTH LUTS  . ABNORMAL ELECTROCARDIOGRAM  . Altered mental status  . UTI (lower urinary tract infection)    Past Medical History: History reviewed. No pertinent past medical history. Past Surgical History: History reviewed. No pertinent past surgical history.  PT Assessment/Plan/Recommendation PT Assessment Clinical Impression Statement: Pt admitted with AMS and limited significantly by cognition and balance. Pt unable to follow 1 step commands greater than 50% of the time, disoriented to situation and safety, and unsafe to mobilize without assist. Should pt not have caregiver at discharge then SNF recommended. Pt will  benefit from therapy to address deficits and maximize independence prior to discharge.  PT Recommendation/Assessment: Patient will need skilled PT in the acute care venue PT Problem List: Decreased balance;Decreased mobility;Decreased safety awareness;Decreased knowledge of use of DME;Decreased cognition Barriers to Discharge: Decreased caregiver support PT Therapy Diagnosis : Abnormality of gait;Altered mental status PT Plan PT Frequency: Min 3X/week PT Treatment/Interventions: Gait training;Stair training;Functional mobility training;Balance training;Therapeutic activities;Cognitive remediation;Patient/family education PT Recommendation Recommendations for Other Services: Speech consult Follow Up Recommendations: Skilled nursing facility;24 hour supervision/assistance Equipment Recommended: Defer to next venue PT Goals  Acute Rehab PT Goals PT Goal Formulation: Patient unable to participate in goal setting Time For Goal Achievement: 2 weeks Pt will go Supine/Side to Sit: with modified independence PT Goal:  Supine/Side to Sit - Progress: Other (comment) Pt will go Sit to Supine/Side: with modified independence PT Goal: Sit to Supine/Side - Progress: Other (comment) Pt will go Sit to Stand: with supervision PT Goal: Sit to Stand - Progress: Other (comment) Pt will go Stand to Sit: with supervision PT Goal: Stand to Sit - Progress: Other (comment) Pt will Ambulate: >150 feet;with least restrictive assistive device;with supervision PT Goal: Ambulate - Progress: Other (comment) Pt will Go Up / Down Stairs: 3-5 stairs;with least restrictive assistive device;with min assist PT Goal: Up/Down Stairs - Progress: Other (comment) Additional Goals Additional Goal #1: Pt will follow 1 step commands >80% of session PT Goal: Additional Goal #1 - Progress: Other (comment)  PT Evaluation Precautions/Restrictions  Precautions Precautions: Fall Prior Functioning  Home Living Lives With: Alone Type of Home: House Home Layout: One level Home Access: Stairs to enter [employer] of Steps: 3 Additional Comments: Pt unable to provide PLOF or home environment.  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 Cognition Cognition Arousal/Alertness: Awake/alert Overall Cognitive Status: Impaired Attention: Impaired Current Attention Level: Sustained Memory: Appears impaired Memory Deficits: STM deficits, unable to recall PLOF or home environment Orientation Level: Oriented to person;Oriented to place;Disoriented to situation;Disoriented to time Safety/Judgement: Decreased safety judgement for tasks assessed Decreased Safety/Judgement: Impulsive;Decreased awareness of need for assistance Awareness of Deficits: Decreased awareness of deficits Awareness of Deficits - Other Comments: Pt  unaware of fall risk despite hematoma and multiple LOB with ambulation Problem Solving: Requires assistance for problem solving Sensation/Coordination   Extremity Assessment RLE Assessment RLE Assessment: Within Functional Limits LLE Assessment LLE Assessment: Within Functional Limits Mobility (including Balance) Bed Mobility Bed Mobility: Yes Supine to Sit: 5: Supervision;HOB flat Supine to Sit Details (indicate cue type and reason): supervision for safety, pt attempting  to climb out of bed on arrival despite cueing and bed alarm Sitting - Scoot to Edge of Bed: 5: Supervision Sit to Supine - Right: 5: Supervision;HOB flat Sit to Supine - Right Details (indicate cue type and reason): cueing for safety Scooting to [facility]: 3: Mod assist Scooting to [facility] Details (indicate cue type and reason): pt unable to follow commands for scooting to [facility] without physical assist at pad and bed in trendelenburg Transfers Transfers: Yes Sit to Stand: 4: Min assist Sit to Stand Details (indicate cue type and reason): assist of balance and safety Stand to Sit: 4: Min assist Stand to Sit Details: assist for balance and cueing for safety Ambulation/Gait Ambulation/Gait: Yes Ambulation/Gait Assistance: 4: Min assist Ambulation/Gait Assistance Details (indicate cue type and reason): Pt with unsteady gait, short strides and LOB x 4 with gait, pt required repeated cueing and direction to be able to return to room as unable to recall room # Ambulation Distance (Feet): 200 Feet Assistive device: None Gait Pattern: Scissoring;Decreased stride length Stairs: No  Posture/Postural Control Posture/Postural Control: No significant limitations Balance Balance Assessed: Yes Static Standing Balance Static Standing - Balance Support: No upper extremity supported Static Standing - Level of Assistance: 5: Stand by assistance Static Standing - Comment/# of Minutes: 3 min, stand by to urinate at commode with wide  BOS Dynamic Standing Balance Dynamic Standing - Level of Assistance: 4: Min assist Exercise    End of Session PT - End of Session Activity Tolerance: Patient tolerated treatment well Patient left: in bed;with call bell in reach;with bed alarm set (pt in chair position in bed) Nurse Communication: Mobility status for transfers;Mobility status for ambulation General Behavior During Session: [facility] for tasks performed Cognition: Impaired  Delorse Lek 03/28/2011, 4:40 PM Toney Sang, PT [phone]

## 2011-03-28 NOTE — Progress Notes (Signed)
.  Clinical social worker completed patient psychosocial assessment, please see assessment in patient shadow chart.  Pt seems open to short term rehab at skilled nursing facility, however is slightly confused about staying at the facility. Pt asked csw to speak with pt brother regarding discharge plans. CSW attempted to reach pt brother, however home number is wrong and the cell phone number had no answer or ability to leave a message. CSW continuing to follow to assist with pt dc plans. CSW put sticky note on pt [place] chart, for nurse to assist with contact number for pt family when they visit.   Catha Gosselin, Theresia Majors  [phone] .03/28/2011 15:04

## 2011-03-29 LAB — BASIC METABOLIC PANEL
BUN: 14 mg/dL (ref 6–23)
Calcium: 8.6 mg/dL (ref 8.4–10.5)
Creatinine, Ser: 0.9 mg/dL (ref 0.50–1.35)
GFR calc non Af Amer: 74 mL/min — ABNORMAL LOW (ref >90)
Glucose, Bld: 100 mg/dL — ABNORMAL HIGH (ref 70–99)

## 2011-03-29 LAB — CBC
HCT: 36.1 % — ABNORMAL LOW (ref 39.0–52.0)
Hemoglobin: 12 g/dL — ABNORMAL LOW (ref 13.0–17.0)
MCH: 29.9 pg (ref 26.0–34.0)
MCHC: 33.2 g/dL (ref 30.0–36.0)
RDW: 12.8 % (ref 11.5–15.5)

## 2011-03-29 NOTE — Progress Notes (Signed)
CSW spoke with Pt's brothers Rosanne Ashing and Gabriel Rung, who stated they would prefer to speak with brother before making decision or information being sent out.  CSW shared conversation weekday CSW had with Pt requesting brother make decision.  Brothers insisted on speaking with Pt before fax out.  Brothers stated they would speak with Pt during week.  CSW stated this would be ok as long as MD had no d/c plans over w/e.  Pt brothers to f/u with weekday CSW. Milus Banister MSW,LCSW w/e Coverage [phone]

## 2011-03-29 NOTE — Progress Notes (Signed)
Patient confused, tried to get out of bed, pulled out IV. Patient was reoriented and put back to bed with the bed alarm on. Will continue to monitor.

## 2011-03-29 NOTE — Procedures (Signed)
EEG ID:  [id number].  HISTORY:  This is a 77 years old man with lethargy, per report.  MEDICATIONS:  No anticonvulsant medications, per report.  CONDITION OF RECORDING:  This 16-lead EEG was recorded with the patient in awake and drowsy states.  Background rhythm: background patterns in wakefulness were well organized with a well sustained posterior dominant rhythm of 7-7.5 Hz, symmetrical and reactive to eye opening and closing. Drowsiness was associated with mild attenuation of voltage and slowing frequencies.  Abnormal potentials: no epileptiform activity or focal slowing was noted.  ACTIVATION PROCEDURES:  Hyperventilation was not performed.  Photic stimulation did not activate tracing.  EKG:  Single-channel of EKG monitoring was unremarkable.  IMPRESSION:  This was an abnormal awake and drowsy EEG due to the presence of mild diffuse background slowing.  This finding may be suggestive of mild diffuse cerebral dysfunction and may be due to toxic/ metabolic encephalopathy, neurodegenerative disorder and/or bi-hemispheric structural abnormality.  Clinical correlation is suggested.          ______________________________ Carmell Austria, MD    [id number] D:  03/28/2011 13:41:58  T:  03/29/2011 01:57:06  Job #:  [id number]

## 2011-03-29 NOTE — Progress Notes (Signed)
Patient ID: Terrance Adams, male   DOB: 03/19/1922, 77 y.o.   MRN: [id number] Subjective: No events overnight. Patient denies chest pain, shortness of breath, abdominal pain.  Objective:  Vital signs in last 24 hours:  Filed Vitals:   03/28/11 1415 03/28/11 2031 03/29/11 0300 03/29/11 0424  BP: 115/58 148/80  155/66  Pulse: 73 68  76  Temp: 97.5 F (36.4 C) 98 F (36.7 C)  98.1 F (36.7 C)  TempSrc: Oral Oral  Oral  Resp: 18 20  19   Height:   5\' 5"  (1.651 m)   Weight:   52 kg (114 lb 10.2 oz)   SpO2: 98% 98%  97%    Intake/Output from previous day:   Intake/Output Summary (Last 24 hours) at 03/29/11 1319 Last data filed at 03/29/11 1100  Gross per 24 hour  Intake    240 ml  Output   1551 ml  Net  -1311 ml    Physical Exam: General: Alert, awake, in no acute distress. HEENT: No bruits, no goiter. Moist mucous membranes, no scleral icterus, no conjunctival pallor. Heart: Regular rate and rhythm, S1/S2 +, no murmurs, rubs, gallops. Lungs: Clear to auscultation bilaterally. No wheezing, no rhonchi, no rales.  Abdomen: Soft, nontender, nondistended, positive bowel sounds. Extremities: No clubbing or cyanosis, no pitting edema,  positive pedal pulses. Neuro: Grossly nonfocal.  Lab Results:  Basic Metabolic Panel:    Component Value Date/Time   NA 138 03/29/2011 0645   K 3.8 03/29/2011 0645   CL 105 03/29/2011 0645   CO2 25 03/29/2011 0645   BUN 14 03/29/2011 0645   CREATININE 0.90 03/29/2011 0645   GLUCOSE 100* 03/29/2011 0645   CALCIUM 8.6 03/29/2011 0645   CBC:    Component Value Date/Time   WBC 7.3 03/29/2011 0645   HGB 12.0* 03/29/2011 0645   HCT 36.1* 03/29/2011 0645   PLT 248 03/29/2011 0645   MCV 90.0 03/29/2011 0645   NEUTROABS 6.6 03/27/2011 2017   LYMPHSABS 1.2 03/27/2011 2017   MONOABS 1.2* 03/27/2011 2017   EOSABS 0.1 03/27/2011 2017   BASOSABS 0.1 03/27/2011 2017      Lab 03/29/11 0645 03/28/11 0416 03/27/11 2017  WBC 7.3 8.5 9.1  HGB  12.0* 11.8* 11.8*  HCT 36.1* 34.6* 34.5*  PLT 248 220 247  MCV 90.0 89.6 89.4  MCH 29.9 30.6 30.6  MCHC 33.2 34.1 34.2  RDW 12.8 12.8 13.0  LYMPHSABS -- -- 1.2  MONOABS -- -- 1.2*  EOSABS -- -- 0.1  BASOSABS -- -- 0.1  BANDABS -- -- --    Lab 03/29/11 0645 03/28/11 0416 03/27/11 2017  NA 138 136 137  K 3.8 3.8 3.8  CL 105 102 102  CO2 25 25 26   GLUCOSE 100* 109* 130*  BUN 14 13 18   CREATININE 0.90 0.96 1.17  CALCIUM 8.6 8.2* 8.9  MG -- -- --   No results found for this basename: INR:5,PROTIME:5 in the last 168 hours Cardiac markers:  Lab 03/28/11 1914 03/28/11 1530 03/28/11 0416  CKMB 3.4 3.4 3.4  TROPONINI <0.30 <0.30 <0.30  MYOGLOBIN -- -- --   No components found with this basename: POCBNP:3 Recent Results (from the past 240 hour(s))  URINE CULTURE     Status: Normal   Collection Time   03/27/11  9:41 PM      Component Value Range Status Comment   Specimen Description URINE, CLEAN CATCH   Final    Special Requests NONE   Final  Setup Time [id number]   Final    Colony Count NO GROWTH   Final    Culture NO GROWTH   Final    Report Status 03/28/2011 FINAL   Final     Studies/Results: Dg Chest 2 View  03/27/2011  *RADIOLOGY REPORT*  Clinical Data: Chest pain and altered level of consciousness.  CHEST - 2 VIEW  Comparison: 03/15/2011  Findings: Lung volumes are reduced compared to the prior study with bibasilar atelectasis present.  There is chronic elevation of the left hemidiaphragm.  No overt pulmonary consolidation or edema.  No pleural fluid identified.  The heart size is stable.  Stable old right-sided rib fractures.  IMPRESSION: Low volumes with bibasilar atelectasis.  Original Report Authenticated By: Reola Calkins, M.D.   Ct Head Wo Contrast  03/27/2011  *RADIOLOGY REPORT*  Clinical Data: Acute mental status change.  Fall.  CT HEAD WITHOUT CONTRAST  Technique:  Contiguous axial images were obtained from the base of the skull through the vertex  without contrast.  Comparison: 03/15/2011  Findings: There is diffuse patchy low density throughout the subcortical and periventricular white matter consistent with chronic small vessel ischemic change.  There is prominence of the sulci and ventricles consistent with brain atrophy.  There is no evidence for acute brain infarct, hemorrhage or mass.  There is partial opacification of the ethmoid air cells.  The mastoid air cells are clear.  The skull appears intact.  IMPRESSION:  1. No acute intracranial abnormalities. 2. Small vessel ischemic change and brain atrophy.  Original Report Authenticated By: Rosealee Albee, M.D.   Mr Brain Wo Contrast  03/28/2011  *RADIOLOGY REPORT*  Clinical Data: 77 year old male with confusion, encephalopathy, lethargic.  MRI HEAD WITHOUT CONTRAST  Technique:  Multiplanar, multiecho pulse sequences of the brain and surrounding structures were obtained according to standard protocol without intravenous contrast.  Comparison: Head CT 03/27/2011 and earlier.  Findings: No restricted diffusion to suggest acute infarction.  No midline shift, mass effect, evidence of mass lesion, ventriculomegaly, extra-axial collection or acute intracranial hemorrhage.  Cervicomedullary junction and pituitary are within normal limits.  Major intracranial vascular flow voids are preserved with intracranial artery dolichoectasia.  Patchy T2 and FLAIR hyperintensity in the cerebral white matter. Increased number of dilated perivascular spaces in the mid brain, basal ganglia and hemispheres. Chronic lacunar infarct in the left thalamus.  Questionable anterior left frontal lobe encephalomalacia (series 6 image 14).  Negative for age visualized cervical spine.  Postoperative changes to the globes.  Mild paranasal sinus mucosal thickening.  Mastoids are clear. Visualized bone marrow signal is within normal limits.  IMPRESSION: 1. No acute intracranial abnormality. 2.  Chronic small vessel disease and  hypertensive encephalopathy suspected.  Original Report Authenticated By: Harley Hallmark, M.D.    Medications: Scheduled Meds:   . ciprofloxacin  400 mg Intravenous BID   Continuous Infusions:   . sodium chloride 125 mL/hr at 03/27/11 2210  . sodium chloride 75 mL/hr at 03/28/11 2323   PRN Meds:.acetaminophen, acetaminophen, ondansetron (ZOFRAN) IV, ondansetron  Assessment/Plan:  Principal Problem:   *Altered mental status -  Precise etiology is unclear at this time. Patient has mild pyuria, is afebrile, has no neck stiffness  Active Problems:   UTI (lower urinary tract infection) - patient has only mild pyuria, so this is likely to be early UTI, and does not fully explain AMS or gait instability - Continue Antibiotics for now, Day# 3/7 and await urine culture.  - PT/OT evaluation  EDUCATION - test results and diagnostic studies were discussed with patient and pt's family who was present at the bedside - patient and family have verbalized the understanding - questions were answered at the bedside and contact information was provided for additional questions or concerns   LOS: 2 days   Sevon Rotert 03/29/2011, 1:19 PM  [facility] Pager: [phone]

## 2011-03-30 LAB — CBC
Hemoglobin: 12.3 g/dL — ABNORMAL LOW (ref 13.0–17.0)
MCH: 30.5 pg (ref 26.0–34.0)
MCHC: 34.3 g/dL (ref 30.0–36.0)
Platelets: 245 10*3/uL (ref 150–400)
RDW: 12.7 % (ref 11.5–15.5)

## 2011-03-30 LAB — BASIC METABOLIC PANEL WITH GFR
BUN: 14 mg/dL (ref 6–23)
CO2: 22 meq/L (ref 19–32)
Calcium: 8.5 mg/dL (ref 8.4–10.5)
Chloride: 106 meq/L (ref 96–112)
Creatinine, Ser: 0.82 mg/dL (ref 0.50–1.35)
GFR calc Af Amer: 90 mL/min — ABNORMAL LOW
GFR calc non Af Amer: 77 mL/min — ABNORMAL LOW
Glucose, Bld: 111 mg/dL — ABNORMAL HIGH (ref 70–99)
Potassium: 3.9 meq/L (ref 3.5–5.1)
Sodium: 136 meq/L (ref 135–145)

## 2011-03-30 LAB — URINE CULTURE: Culture  Setup Time: 201212300258

## 2011-03-30 NOTE — Progress Notes (Signed)
Patient ID: Terrance Adams, male   DOB: 1922/01/17, 77 y.o.   MRN: [id number] Subjective: No events overnight. Patient denies chest pain, shortness of breath, abdominal pain.   Objective:  Vital signs in last 24 hours:  Filed Vitals:   03/29/11 0300 03/29/11 0424 03/29/11 2022 03/30/11 0500  BP:  155/66 164/78 145/74  Pulse:  76 72 68  Temp:  98.1 F (36.7 C) 98 F (36.7 C) 98.4 F (36.9 C)  TempSrc:  Oral Oral Oral  Resp:  19 19 18   Height: 5\' 5"  (1.651 m)     Weight: 52 kg (114 lb 10.2 oz)     SpO2:  97% 97% 96%    Intake/Output from previous day:   Intake/Output Summary (Last 24 hours) at 03/30/11 1159 Last data filed at 03/30/11 0900  Gross per 24 hour  Intake   1260 ml  Output    600 ml  Net    660 ml    Physical Exam: General: Alert, awake, oriented x2, in no acute distress. HEENT: No bruits, no goiter. Moist mucous membranes, no scleral icterus, no conjunctival pallor. Heart: Regular rate and rhythm, S1/S2 +, no murmurs, rubs, gallops. Lungs: Clear to auscultation bilaterally. No wheezing, no rhonchi, no rales.  Abdomen: Soft, nontender, nondistended, positive bowel sounds. Extremities: No clubbing or cyanosis, no pitting edema,  positive pedal pulses. Neuro: Grossly nonfocal.  Lab Results:  Basic Metabolic Panel:    Component Value Date/Time   NA 136 03/30/2011 0500   K 3.9 03/30/2011 0500   CL 106 03/30/2011 0500   CO2 22 03/30/2011 0500   BUN 14 03/30/2011 0500   CREATININE 0.82 03/30/2011 0500   GLUCOSE 111* 03/30/2011 0500   CALCIUM 8.5 03/30/2011 0500   CBC:    Component Value Date/Time   WBC 8.9 03/30/2011 0500   HGB 12.3* 03/30/2011 0500   HCT 35.9* 03/30/2011 0500   PLT 245 03/30/2011 0500   MCV 89.1 03/30/2011 0500   NEUTROABS 6.6 03/27/2011 2017   LYMPHSABS 1.2 03/27/2011 2017   MONOABS 1.2* 03/27/2011 2017   EOSABS 0.1 03/27/2011 2017   BASOSABS 0.1 03/27/2011 2017      Lab 03/30/11 0500 03/29/11 0645 03/28/11 0416 03/27/11  2017  WBC 8.9 7.3 8.5 9.1  HGB 12.3* 12.0* 11.8* 11.8*  HCT 35.9* 36.1* 34.6* 34.5*  PLT 245 248 220 247  MCV 89.1 90.0 89.6 89.4  MCH 30.5 29.9 30.6 30.6  MCHC 34.3 33.2 34.1 34.2  RDW 12.7 12.8 12.8 13.0  LYMPHSABS -- -- -- 1.2  MONOABS -- -- -- 1.2*  EOSABS -- -- -- 0.1  BASOSABS -- -- -- 0.1  BANDABS -- -- -- --    Lab 03/30/11 0500 03/29/11 0645 03/28/11 0416 03/27/11 2017  NA 136 138 136 137  K 3.9 3.8 3.8 3.8  CL 106 105 102 102  CO2 22 25 25 26   GLUCOSE 111* 100* 109* 130*  BUN 14 14 13 18   CREATININE 0.82 0.90 0.96 1.17  CALCIUM 8.5 8.6 8.2* 8.9  MG -- -- -- --   No results found for this basename: INR:5,PROTIME:5 in the last 168 hours Cardiac markers:  Lab 03/28/11 1914 03/28/11 1530 03/28/11 0416  CKMB 3.4 3.4 3.4  TROPONINI <0.30 <0.30 <0.30  MYOGLOBIN -- -- --   No components found with this basename: POCBNP:3 Recent Results (from the past 240 hour(s))  URINE CULTURE     Status: Normal   Collection Time   03/27/11  9:41  PM      Component Value Range Status Comment   Specimen Description URINE, CLEAN CATCH   Final    Special Requests NONE   Final    Setup Time [id number]   Final    Colony Count NO GROWTH   Final    Culture NO GROWTH   Final    Report Status 03/28/2011 FINAL   Final     Studies/Results: Mr Brain Wo Contrast  03/28/2011  *RADIOLOGY REPORT*  Clinical Data: 77 year old male with confusion, encephalopathy, lethargic.  MRI HEAD WITHOUT CONTRAST  Technique:  Multiplanar, multiecho pulse sequences of the brain and surrounding structures were obtained according to standard protocol without intravenous contrast.  Comparison: Head CT 03/27/2011 and earlier.  Findings: No restricted diffusion to suggest acute infarction.  No midline shift, mass effect, evidence of mass lesion, ventriculomegaly, extra-axial collection or acute intracranial hemorrhage.  Cervicomedullary junction and pituitary are within normal limits.  Major intracranial vascular  flow voids are preserved with intracranial artery dolichoectasia.  Patchy T2 and FLAIR hyperintensity in the cerebral white matter. Increased number of dilated perivascular spaces in the mid brain, basal ganglia and hemispheres. Chronic lacunar infarct in the left thalamus.  Questionable anterior left frontal lobe encephalomalacia (series 6 image 14).  Negative for age visualized cervical spine.  Postoperative changes to the globes.  Mild paranasal sinus mucosal thickening.  Mastoids are clear. Visualized bone marrow signal is within normal limits.  IMPRESSION: 1. No acute intracranial abnormality. 2.  Chronic small vessel disease and hypertensive encephalopathy suspected.  Original Report Authenticated By: Harley Hallmark, M.D.    Medications: Scheduled Meds:   . ciprofloxacin  400 mg Intravenous BID   Continuous Infusions:   . sodium chloride 125 mL/hr at 03/27/11 2210  . sodium chloride 75 mL/hr at 03/29/11 2006   PRN Meds:.acetaminophen, acetaminophen, ondansetron (ZOFRAN) IV, ondansetron  Assessment/Plan:   Principal Problem:   *Altered mental status  - unclear etiology at present - MRI brain and CT head negative - patient remains intermittently confused - as per family members, patient doesnt not want to go to SNF but rather outpatient rehab - PT evaluation for outpatient rehab  Active Problems:   UTI (lower urinary tract infection)  - patient has only mild pyuria, so this is likely to be early UTI, and does not fully explain AMS or gait instability  - Continue Antibiotics for now, Day# 4/7; urine culture negative to date  EDUCATION  - test results and diagnostic studies were discussed with patient and pt's family who was present at the bedside  - patient and family have verbalized the understanding  - questions were answered at the bedside and contact information was provided for additional questions or concerns      LOS: 3 days   [name] 03/30/2011, 11:59  AM  [facility] Pager: [phone]
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
## 2011-03-31 NOTE — Progress Notes (Signed)
PT WAS DC'D TO HOME WITH OP PT.  PT HAS A SUPPORTIVE FAMILY AND PT STILL WORKS AND DRIVES.  FAMILY WAS ADVISED THAT HE SHOULD NOT DRIVE UNTIL CLEARED BY PT.  PT REFUSED [facility] PT AND SNF.  PT WAS FAXED OUT TO [facility].   Willa Rough 03/31/2011 [phone] OR [phone]

## 2011-04-09 ENCOUNTER — Ambulatory Visit: Payer: Medicare Other | Attending: Internal Medicine | Admitting: Physical Therapy

## 2011-04-09 DIAGNOSIS — N390 Urinary tract infection, site not specified: Secondary | ICD-10-CM | POA: Insufficient documentation

## 2011-04-09 DIAGNOSIS — IMO0001 Reserved for inherently not codable concepts without codable children: Secondary | ICD-10-CM | POA: Insufficient documentation

## 2011-04-09 DIAGNOSIS — G9340 Encephalopathy, unspecified: Secondary | ICD-10-CM | POA: Insufficient documentation

## 2011-04-09 DIAGNOSIS — Z9181 History of falling: Secondary | ICD-10-CM | POA: Insufficient documentation

## 2013-10-08 ENCOUNTER — Emergency Department (HOSPITAL_COMMUNITY): Payer: Medicare Other

## 2013-10-08 ENCOUNTER — Inpatient Hospital Stay (HOSPITAL_COMMUNITY)
Admission: EM | Admit: 2013-10-08 | Discharge: 2013-10-12 | DRG: 558 | Disposition: A | Discharge status: Skilled Nursing Facility | Payer: Medicare Other | Attending: Family Medicine | Admitting: Family Medicine

## 2013-10-08 ENCOUNTER — Encounter (HOSPITAL_COMMUNITY): Payer: Self-pay | Admitting: Emergency Medicine

## 2013-10-08 DIAGNOSIS — M25569 Pain in unspecified knee: Secondary | ICD-10-CM | POA: Diagnosis present

## 2013-10-08 DIAGNOSIS — M25562 Pain in left knee: Secondary | ICD-10-CM

## 2013-10-08 DIAGNOSIS — M25561 Pain in right knee: Secondary | ICD-10-CM | POA: Diagnosis present

## 2013-10-08 DIAGNOSIS — D649 Anemia, unspecified: Secondary | ICD-10-CM | POA: Diagnosis present

## 2013-10-08 DIAGNOSIS — R531 Weakness: Secondary | ICD-10-CM | POA: Diagnosis present

## 2013-10-08 DIAGNOSIS — T796XXA Traumatic ischemia of muscle, initial encounter: Secondary | ICD-10-CM

## 2013-10-08 DIAGNOSIS — R5383 Other fatigue: Secondary | ICD-10-CM

## 2013-10-08 DIAGNOSIS — Z66 Do not resuscitate: Secondary | ICD-10-CM | POA: Diagnosis present

## 2013-10-08 DIAGNOSIS — R5381 Other malaise: Secondary | ICD-10-CM

## 2013-10-08 DIAGNOSIS — J984 Other disorders of lung: Secondary | ICD-10-CM

## 2013-10-08 DIAGNOSIS — M6282 Rhabdomyolysis: Principal | ICD-10-CM | POA: Diagnosis present

## 2013-10-08 DIAGNOSIS — N390 Urinary tract infection, site not specified: Secondary | ICD-10-CM

## 2013-10-08 DIAGNOSIS — H9190 Unspecified hearing loss, unspecified ear: Secondary | ICD-10-CM

## 2013-10-08 DIAGNOSIS — E860 Dehydration: Secondary | ICD-10-CM

## 2013-10-08 DIAGNOSIS — N401 Enlarged prostate with lower urinary tract symptoms: Secondary | ICD-10-CM

## 2013-10-08 DIAGNOSIS — R9431 Abnormal electrocardiogram [ECG] [EKG]: Secondary | ICD-10-CM

## 2013-10-08 DIAGNOSIS — D72829 Elevated white blood cell count, unspecified: Secondary | ICD-10-CM | POA: Diagnosis present

## 2013-10-08 DIAGNOSIS — S8000XA Contusion of unspecified knee, initial encounter: Secondary | ICD-10-CM

## 2013-10-08 HISTORY — DX: Urinary tract infection, site not specified: N39.0

## 2013-10-08 LAB — CBC WITH DIFFERENTIAL/PLATELET
BASOS PCT: 0 % (ref 0–1)
Basophils Absolute: 0 10*3/uL (ref 0.0–0.1)
Eosinophils Absolute: 0.1 10*3/uL (ref 0.0–0.7)
Eosinophils Relative: 1 % (ref 0–5)
HEMATOCRIT: 33 % — AB (ref 39.0–52.0)
HEMOGLOBIN: 10.8 g/dL — AB (ref 13.0–17.0)
LYMPHS ABS: 1.6 10*3/uL (ref 0.7–4.0)
Lymphocytes Relative: 12 % (ref 12–46)
MCH: 29.1 pg (ref 26.0–34.0)
MCHC: 32.7 g/dL (ref 30.0–36.0)
MCV: 88.9 fL (ref 78.0–100.0)
MONO ABS: 1.5 10*3/uL — AB (ref 0.1–1.0)
MONOS PCT: 12 % (ref 3–12)
NEUTROS ABS: 9.7 10*3/uL — AB (ref 1.7–7.7)
NEUTROS PCT: 75 % (ref 43–77)
Platelets: 251 10*3/uL (ref 150–400)
RBC: 3.71 MIL/uL — AB (ref 4.22–5.81)
RDW: 13.8 % (ref 11.5–15.5)
WBC: 13 10*3/uL — ABNORMAL HIGH (ref 4.0–10.5)

## 2013-10-08 LAB — CK: Total CK: 3940 U/L — ABNORMAL HIGH (ref 7–232)

## 2013-10-08 LAB — COMPREHENSIVE METABOLIC PANEL
ALBUMIN: 3.3 g/dL — AB (ref 3.5–5.2)
ALK PHOS: 73 U/L (ref 39–117)
ALT: 22 U/L (ref 0–53)
ANION GAP: 15 (ref 5–15)
AST: 64 U/L — ABNORMAL HIGH (ref 0–37)
BILIRUBIN TOTAL: 0.3 mg/dL (ref 0.3–1.2)
BUN: 25 mg/dL — AB (ref 6–23)
CHLORIDE: 105 meq/L (ref 96–112)
CO2: 23 meq/L (ref 19–32)
CREATININE: 1.01 mg/dL (ref 0.50–1.35)
Calcium: 8.8 mg/dL (ref 8.4–10.5)
GFR, EST AFRICAN AMERICAN: 73 mL/min — AB (ref >90)
GFR, EST NON AFRICAN AMERICAN: 63 mL/min — AB (ref >90)
GLUCOSE: 129 mg/dL — AB (ref 70–99)
POTASSIUM: 4 meq/L (ref 3.7–5.3)
Sodium: 143 mEq/L (ref 137–147)
Total Protein: 6.8 g/dL (ref 6.0–8.3)

## 2013-10-08 LAB — URINALYSIS, ROUTINE W REFLEX MICROSCOPIC
BILIRUBIN URINE: NEGATIVE
GLUCOSE, UA: NEGATIVE mg/dL
KETONES UR: 15 mg/dL — AB
LEUKOCYTES UA: NEGATIVE
NITRITE: NEGATIVE
PH: 6.5 (ref 5.0–8.0)
Protein, ur: 30 mg/dL — AB
SPECIFIC GRAVITY, URINE: 1.023 (ref 1.005–1.030)
Urobilinogen, UA: 1 mg/dL (ref 0.0–1.0)

## 2013-10-08 LAB — TROPONIN I
TROPONIN I: 0.35 ng/mL — AB (ref <0.30)
TROPONIN I: 0.36 ng/mL — AB (ref <0.30)

## 2013-10-08 LAB — URINE MICROSCOPIC-ADD ON

## 2013-10-08 MED ORDER — SODIUM CHLORIDE 0.9 % IV SOLN
INTRAVENOUS | Status: DC
  Administered 2013-10-08 – 2013-10-11 (×3): via INTRAVENOUS

## 2013-10-08 MED ORDER — ENOXAPARIN SODIUM 40 MG/0.4ML ~~LOC~~ SOLN
40.0000 mg | SUBCUTANEOUS | Status: DC
  Administered 2013-10-08 – 2013-10-11 (×4): 40 mg via SUBCUTANEOUS
  Filled 2013-10-08 (×5): qty 0.4

## 2013-10-08 MED ORDER — ONDANSETRON HCL 4 MG/2ML IJ SOLN: 4.0000 mg | Freq: Four times a day (QID) | INTRAMUSCULAR | Status: DC | PRN

## 2013-10-08 MED ORDER — ONDANSETRON HCL 4 MG PO TABS: 4.0000 mg | ORAL_TABLET | Freq: Four times a day (QID) | ORAL | Status: DC | PRN

## 2013-10-08 MED ORDER — ALUM & MAG HYDROXIDE-SIMETH 200-200-20 MG/5ML PO SUSP: 30.0000 mL | Freq: Four times a day (QID) | ORAL | Status: DC | PRN

## 2013-10-08 MED ORDER — ACETAMINOPHEN 325 MG PO TABS
650.0000 mg | ORAL_TABLET | Freq: Four times a day (QID) | ORAL | Status: DC | PRN
  Administered 2013-10-10 – 2013-10-12 (×2): 650 mg via ORAL
  Filled 2013-10-08 (×2): qty 2

## 2013-10-08 MED ORDER — HYDROMORPHONE HCL PF 1 MG/ML IJ SOLN
0.5000 mg | INTRAMUSCULAR | Status: DC | PRN
  Administered 2013-10-10: 0.5 mg via INTRAVENOUS
  Administered 2013-10-11 – 2013-10-12 (×2): 1 mg via INTRAVENOUS
  Filled 2013-10-08 (×3): qty 1

## 2013-10-08 MED ORDER — ACETAMINOPHEN 650 MG RE SUPP: 650.0000 mg | Freq: Four times a day (QID) | RECTAL | Status: DC | PRN

## 2013-10-08 MED ORDER — SODIUM CHLORIDE 0.9 % IV BOLUS (SEPSIS)
500.0000 mL | Freq: Once | INTRAVENOUS | Status: AC
  Administered 2013-10-08: 500 mL via INTRAVENOUS

## 2013-10-08 MED ORDER — OXYCODONE HCL 5 MG PO TABS
5.0000 mg | ORAL_TABLET | ORAL | Status: DC | PRN
Start: 2013-10-08 — End: 2013-10-12
  Administered 2013-10-10 – 2013-10-11 (×2): 5 mg via ORAL
  Filled 2013-10-08 (×3): qty 1

## 2013-10-08 MED ORDER — SODIUM CHLORIDE 0.9 % IV SOLN
INTRAVENOUS | Status: AC
  Administered 2013-10-08: 21:00:00 via INTRAVENOUS

## 2013-10-08 NOTE — ED Provider Notes (Signed)
CSN: [id number]     Arrival date & time 10/08/13  1813 History   First MD Initiated Contact with Patient 10/08/13 1815     Chief Complaint  Patient presents with  . Fall  . Weakness     (Consider location/radiation/quality/duration/timing/severity/associated sxs/prior Treatment) HPI Comments: Patient is a 80 year old male with no significant past medical history and who takes no medications. He lives at home by himself with frequent visits from his brothers. His brothers went to check on him this morning and he was found on the floor unable to get up. Patient cannot say why he was on the floor. He denies any difficulty breathing or chest pain. He does admit to discomfort in both knees. Family stayed with him throughout the morning and afternoon, however he has appeared weak. He was hospitalized for a urinary tract infection several years ago with a similar presentation.  Patient is a 80 y.o. male presenting with fall. The history is provided by the patient.  Fall This is a new problem. The current episode started 12 to 24 hours ago. The problem occurs constantly. The problem has been gradually worsening. Nothing aggravates the symptoms. Nothing relieves the symptoms. He has tried nothing for the symptoms. The treatment provided no relief.    History reviewed. No pertinent past medical history. Past Surgical History  Procedure Laterality Date  . Hernia repair     No family history on file. History  Substance Use Topics  . Smoking status: Never Smoker   . Smokeless tobacco: Not on file  . Alcohol Use: No    Review of Systems  Constitutional: Positive for fatigue.  All other systems reviewed and are negative.     Allergies  Review of patient's allergies indicates no known allergies.  Home Medications   Prior to Admission medications   Not on File   BP 126/73  Pulse 51  Temp(Src) 98.2 F (36.8 C) (Oral)  Resp 15  SpO2 97% Physical Exam  Nursing note and vitals  reviewed. Constitutional:  Patient is an elderly male in no acute distress. He is somnolent but arousable and appropriate.  HENT:  Head: Normocephalic and atraumatic.  Mouth/Throat: Oropharynx is clear and moist.  Eyes: EOM are normal. Pupils are equal, round, and reactive to light.  Neck: Normal range of motion. Neck supple.  Cardiovascular: Normal rate, regular rhythm and normal heart sounds.   No murmur heard. Pulmonary/Chest: Effort normal and breath sounds normal. No respiratory distress. He has no wheezes.  Abdominal: Soft. Bowel sounds are normal. He exhibits no distension. There is no tenderness.  Musculoskeletal: Normal range of motion. He exhibits no edema.  Both knees have abrasions to the anterior aspect. There is no instability or laxity.  Neurological:  The patient is somnolent but alert and oriented.  Skin: Skin is warm.    ED Course  Procedures (including critical care time) Labs Review Labs Reviewed  URINALYSIS, ROUTINE W REFLEX MICROSCOPIC  CBC WITH DIFFERENTIAL  COMPREHENSIVE METABOLIC PANEL  TROPONIN I    Imaging Review No results found.   EKG Interpretation   Date/Time:  Saturday October 08 2013 18:19:09 EDT Ventricular Rate:  83 PR Interval:  138 QRS Duration: 90 QT Interval:  380 QTC Calculation: 446 R Axis:   69 Text Interpretation:  Sinus rhythm Probable left atrial enlargement  Borderline ST depression, inferior leads Confirmed by Malva CoganELOS  MD, Shirleymae Hauth  ([id number]) on 10/08/2013 6:34:42 PM      MDM   Final diagnoses:  None  Patient is a 80 year old male brought for evaluation of weakness and fall. He apparently spent the night last night on the floor as he could not get up. His workup reveals mild elevation of his white count and metabolic profile that is reflective of dehydration. He does have a total CK of 4000 and his troponin is mildly elevated at 0.35. He will be hydrated with normal saline and I feel will require admission to the  hospitalist service. I have spoken with Dr. Lovell Sheehan who agrees to admit. She would like cardiology involved as the troponin is mildly elevated. We will speak with them and have them evaluate the patient as well.    Geoffery Lyons, MD 10/08/13 2032

## 2013-10-08 NOTE — H&P (Addendum)
[facility] History and Physical  Terrance Adams QMV:[id number] DOB: 1921-11-27 DOA: 10/08/2013  Referring physician:  EDP PCP: Sanda Linger, MD  Specialists:   Chief Complaint:    Found on Floor  HPI: Terrance Adams is a 80 y.o. male who lives alone, has no previous medical problems who fell last night in his home and lay on the floor all night until he was found by his brothers on the floor this AM.   He was weak and could not get up.  He denies any chest pain.   He also denies headache or syncope.   He does have complaints of bilateral knee pain following his fall.   In the ED, he was found to have a  total CPK level of 3940.  And a mildly elevated troponin at 0.35.   Cardiology has been contacted by the EDP Dr. Judd Lien.   He was referred for medical admission.      Review of Systems:  Constitutional: No Weight Loss, No Weight Gain, Night Sweats, Fevers, Chills, Fatigue, +Generalized Weakness HEENT: No Headaches, Difficulty Swallowing,Tooth/Dental Problems,Sore Throat,  No Sneezing, Rhinitis, Ear Ache, Nasal Congestion, or Post Nasal Drip,  Cardio-vascular:  No Chest pain, Orthopnea, PND, Edema in lower extremities, Anasarca, Dizziness, Palpitations  Resp: No Dyspnea, No DOE, No Cough, No Hemoptysis, No Wheezing.    GI: No Heartburn, Indigestion, Abdominal Pain, Nausea, Vomiting, Diarrhea, Change in Bowel Habits,  Loss of Appetite  GU: No Dysuria, Change in Color of Urine, No Urgency or Frequency.  No Flank pain.  Musculoskeletal: +Knee Pain Bilaterally,  No Decreased Range of Motion. No Back Pain.  Neurologic: No Syncope, No Seizures, Muscle Weakness, Paresthesia, Vision Disturbance or Loss, No Diplopia, No Vertigo, No Difficulty Walking,  Skin: No Rash or Lesions. Psych: No Change in Mood or Affect. No Depression or Anxiety. No Memory loss. No Confusion or Hallucinations   History reviewed. No pertinent past medical history.   Past Surgical History  Procedure Laterality Date   . Hernia repair       Prior to Admission medications   Not on File    No Known Allergies   Social History:  reports that he has never smoked. He does not have any smokeless tobacco history on file. He reports that he does not drink alcohol or use illicit drugs.     No family history on file.     Physical Exam:  GEN:  Pleasant Elderly Well Nourished and Well Developed  80 y.o. Caucasian male examined and in no acute distress; cooperative with exam Filed Vitals:   10/08/13 1823 10/08/13 2032  BP: 126/73 125/56  Pulse: 51 75  Temp: 98.2 F (36.8 C)   TempSrc: Oral   Resp: 15   SpO2: 97% 98%   Blood pressure 125/56, pulse 75, temperature 98.2 F (36.8 C), temperature source Oral, resp. rate 15, SpO2 98.00%. PSYCH: He is alert and oriented x4; does not appear anxious does not appear depressed; affect is normal HEENT: Normocephalic and Atraumatic, Mucous membranes pink; PERRLA; EOM intact; Fundi:  Benign;  No scleral icterus, Nares: Patent, Oropharynx: Clear, Fair Dentition, Neck:  FROM, no cervical lymphadenopathy nor thyromegaly or carotid bruit; no JVD; Breasts:: Not examined CHEST WALL: No tenderness CHEST: Normal respiration, clear to auscultation bilaterally HEART: Regular rate and rhythm; no murmurs rubs or gallops BACK: No kyphosis or scoliosis; no CVA tenderness ABDOMEN: Positive Bowel Sounds, soft non-tender; no masses, no organomegaly. Rectal Exam: Not done EXTREMITIES: No cyanosis, clubbing or  edema; no ulcerations. Genitalia: not examined PULSES: 2+ and symmetric SKIN: Normal hydration no rash or ulceration CNS:   Mental Status:  Alert, oriented, thought content appropriate. Speech fluent without evidence of aphasia. Able to follow 3 step commands without difficulty. In No obvious pain.  Cranial Nerves:  II: Discs flat bilaterally; Visual fields Intact,  or Decreased peripheral vision to the left or right. Pupils equal and reactive.  III,IV, VI: extra-ocular  motions intact bilaterally  V,VII: smile symmetric, facial light touch sensation normal bilaterally  VIII: hearing decreased bilaterally  IX,X: gag reflex present  XI: bilateral shoulder shrug  XII: midline tongue extension  Motor:  Right:  Upper extremity 4/5   Left:  Upper extremity 45/5  Right:  Lower extremity 4/5   Left:  Lower extremity 4/5  Tone and bulk:normal tone throughout; no atrophy noted  Sensory: Pinprick and light touch intact throughout, bilaterally  Deep Tendon Reflexes: 2+ and symmetric throughout  Plantars/ Babinski: Right: normal Left: normal   Cerebellar: Finger to nose without difficulty.  Gait: deferred  Vascular: pulses palpable throughout    Labs on Admission:  Basic Metabolic Panel:  Recent Labs Lab 10/08/13 1916  NA 143  K 4.0  CL 105  CO2 23  GLUCOSE 129*  BUN 25*  CREATININE 1.01  CALCIUM 8.8   Liver Function Tests:  Recent Labs Lab 10/08/13 1916  AST 64*  ALT 22  ALKPHOS 73  BILITOT 0.3  PROT 6.8  ALBUMIN 3.3*   No results found for this basename: LIPASE, AMYLASE,  in the last 168 hours No results found for this basename: AMMONIA,  in the last 168 hours CBC:  Recent Labs Lab 10/08/13 1916  WBC 13.0*  NEUTROABS 9.7*  HGB 10.8*  HCT 33.0*  MCV 88.9  PLT 251   Cardiac Enzymes:  Recent Labs Lab 10/08/13 1916  CKTOTAL 3940*  TROPONINI 0.35*    BNP (last 3 results) No results found for this basename: PROBNP,  in the last 8760 hours CBG: No results found for this basename: GLUCAP,  in the last 168 hours  Radiological Exams on Admission: Ct Head Wo Contrast  10/08/2013   CLINICAL DATA:  Fall.  EXAM: CT HEAD WITHOUT CONTRAST  TECHNIQUE: Contiguous axial images were obtained from the base of the skull through the vertex without intravenous contrast.  COMPARISON:  MRI brain 03/28/2011.  Head CT 03/27/2011.  FINDINGS: No mass. No hydrocephalus. No hemorrhage. No acute bony abnormality. Cerebral vascular disease. Mucosal  thickening and ethmoidal sinuses. Opacification right maxillary sinus. These present cyst with sinusitis. Sclerotic changes noted a right mastoid this could be from old infection. No acute bony abnormality identified.  IMPRESSION: 1. No acute intracranial abnormality. 2. Opacification right maxillary sinus and mucosal thickening of the ethmoidal sinuses consistent with sinusitis. 3. Sclerosis of the right mastoid most likely from old infection.   Electronically Signed   By: Maisie Fushomas  Register   On: 10/08/2013 20:37      EKG: Independently reviewed. Sinus Rhythm, PVC, rate = 83    Assessment/Plan:   80 y.o. male with  Principal Problem:   1.   Rhabdomyolysis-  Gentle IVFs, Monitor Total CPK levels, and BUN/Cr.     Active Problems:    2.   Weakness-   Should improve with Rehydration, and pt Consult for Strengthening.  CT Head Negative for Acute changes    3.   Leukocytosis, unspecified-  Check UA, may be a Stress Rxn.  Monitor Trend.  4.   Knee pain, bilateral-  from fall, Plain films of Knees  Reveal  no Fracture, dx is Contusion   5.   Normocytic anemia-  Send Anemia panel.   Monitor trend.     6.   +Troponin/ ? NSTEMI-  No Chest Pain, No acute EKG changes,  Repeat Troponin, and cycle troponins,     Medical management.      7.    Other - Social Work Consult for Need for [employer].      8.    DVT Prophylaxis with Lovenox.     Code Status:     DO NOT RESUSCITATE Family Communication:    Brother at Bedside Disposition Plan:         Time spent:  84 Minutes  Ron Parker [facility] Pager [phone]  If 7PM-7AM, please contact night-coverage [email or web address] Password [email or web address] 10/08/2013, 9:06 PM

## 2013-10-08 NOTE — ED Notes (Signed)
Pt arrives via EMS from home. Family called EMS, states that they found pt on the floor this morning. Pt states that he fell last night and spent the night on the floor. Family got pt off the floor and spent the day with him. States that this afternoon he was having increased difficulty ambulating and wanted pt to come the hospital. Pt lives alone and family comes to check on him throughout the day. Baseline mental status is alert and oriented x4 "most of the time." Family does report hx of UTI requiring hospitalization. Pt c/o bilat knee pain 8/10.

## 2013-10-08 NOTE — ED Notes (Signed)
Troponin of 0.35. MD made aware.

## 2013-10-09 LAB — BASIC METABOLIC PANEL
Anion gap: 13 (ref 5–15)
BUN: 18 mg/dL (ref 6–23)
CHLORIDE: 108 meq/L (ref 96–112)
CO2: 22 meq/L (ref 19–32)
Calcium: 7.9 mg/dL — ABNORMAL LOW (ref 8.4–10.5)
Creatinine, Ser: 0.87 mg/dL (ref 0.50–1.35)
GFR calc Af Amer: 85 mL/min — ABNORMAL LOW (ref >90)
GFR, EST NON AFRICAN AMERICAN: 74 mL/min — AB (ref >90)
GLUCOSE: 90 mg/dL (ref 70–99)
POTASSIUM: 3.9 meq/L (ref 3.7–5.3)
Sodium: 143 mEq/L (ref 137–147)

## 2013-10-09 LAB — TROPONIN I
TROPONIN I: 0.34 ng/mL — AB (ref <0.30)
Troponin I: <0.3 ng/mL (ref <0.30)

## 2013-10-09 LAB — CBC
HEMATOCRIT: 30.1 % — AB (ref 39.0–52.0)
HEMOGLOBIN: 9.8 g/dL — AB (ref 13.0–17.0)
MCH: 29.2 pg (ref 26.0–34.0)
MCHC: 32.6 g/dL (ref 30.0–36.0)
MCV: 89.6 fL (ref 78.0–100.0)
PLATELETS: 209 10*3/uL (ref 150–400)
RBC: 3.36 MIL/uL — AB (ref 4.22–5.81)
RDW: 13.8 % (ref 11.5–15.5)
WBC: 9.3 10*3/uL (ref 4.0–10.5)

## 2013-10-09 NOTE — Progress Notes (Signed)
Araceli BoucheJoe Lessner, pt brother requested all 4 SR remain when family members are not at the bedside

## 2013-10-09 NOTE — Evaluation (Addendum)
Physical Therapy Evaluation Patient Details Name: Terrance Adams MRN: [id number] DOB: 01-22-1922 Today's Date: 10/09/2013   History of Present Illness  Patient is a 80 yo male admitted 10/08/13 after being found on his floor by his brothers.  He had fallen and been in the floor over night.  Patient c/o bil. knee pain.  Patient with dehydration and slightly elevated troponins.  No significant PMH.  Clinical Impression  Patient presents with problems listed below.  Will benefit from acute PT to maximize independence prior to discharge.  Patient lives alone.  Today required max encouragement to participate with PT.  Required +2 assist to stand.  Recommend SNF at discharge for continued therapy.    Follow Up Recommendations SNF;Supervision/Assistance - 24 hour    Equipment Recommendations  Rolling walker with 5" wheels    Recommendations for Other Services       Precautions / Restrictions Precautions Precautions: Fall Restrictions Weight Bearing Restrictions: No      Mobility  Bed Mobility Overal bed mobility: Needs Assistance;+2 for physical assistance Bed Mobility: Supine to Sit;Sit to Supine     Supine to sit: Max assist;+2 for physical assistance Sit to supine: Max assist;+2 for physical assistance   General bed mobility comments: Max encouragement for patient to participate with PT.  Verbal and physical cues for technique.  Assist to move LE's off of bed and to raise trunk to sitting.  Patient able to sit EOB with min guard assist.  Patient with flexed posture, leaning posteriorly.  Transfers Overall transfer level: Needs assistance Equipment used: 2 person hand held assist Transfers: Sit to/from Stand Sit to Stand: Max assist;+2 physical assistance         General transfer comment: Patient required assist to rise to standing and for balance.  Patient with flexed posture.  Cues to extend knees and hips and stand upright.  Patient stood with max assist of 2 x 45 seconds.     Ambulation/Gait             General Gait Details: Unable to take steps in standing today.  Stairs            Wheelchair Mobility    Modified Rankin (Stroke Patients Only)       Balance Overall balance assessment: Needs assistance Sitting-balance support: Bilateral upper extremity supported;Feet supported Sitting balance-Leahy Scale: Fair     Standing balance support: Bilateral upper extremity supported Standing balance-Leahy Scale: Zero                               Pertinent Vitals/Pain     Home Living Family/patient expects to be discharged to:: Skilled nursing facility Living Arrangements: Alone Available Help at Discharge: Family;Available PRN/intermittently                  Prior Function Level of Independence: Independent;Needs assistance   Gait / Transfers Assistance Needed: No assistive device for ambulation.  Was having increased falls.  ADL's / Homemaking Assistance Needed: Brothers provided meals.        Hand Dominance        Extremity/Trunk Assessment   Upper Extremity Assessment: Generalized weakness           Lower Extremity Assessment: Generalized weakness         Communication   Communication: HOH;Expressive difficulties (Difficulty understanding patient's speech)  Cognition Arousal/Alertness: Lethargic Behavior During Therapy: Anxious Overall Cognitive Status: Difficult to assess  General Comments      Exercises        Assessment/Plan    PT Assessment Patient needs continued PT services  PT Diagnosis Difficulty walking;Generalized weakness   PT Problem List Decreased strength;Decreased activity tolerance;Decreased balance;Decreased mobility;Decreased knowledge of use of DME;Pain  PT Treatment Interventions DME instruction;Gait training;Functional mobility training;Therapeutic exercise;Balance training;Patient/family education   PT Goals (Current goals can be  found in the Care Plan section) Acute Rehab PT Goals Patient Stated Goal: None stated PT Goal Formulation: With patient/family Time For Goal Achievement: 10/23/13 Potential to Achieve Goals: Fair    Frequency Min 3X/week   Barriers to discharge Decreased caregiver support Patient lives alone    Co-evaluation               End of Session Equipment Utilized During Treatment: Gait belt Activity Tolerance: Patient limited by fatigue Patient left: in bed;with call bell/phone within reach;with family/visitor present;with bed alarm set Nurse Communication: Mobility status         Time: 1315-1341 PT Time Calculation (min): 26 min   Charges:   PT Evaluation $Initial PT Evaluation Tier I: 1 Procedure PT Treatments $Therapeutic Activity: 8-22 mins   PT G Codes:          Vena AustriaDavis, Torianna Junio H 10/09/2013, 4:53 PM Durenda HurtSusan H. Renaldo Fiddleravis, PT, [facility] Acute Rehab Services Pager [phone]

## 2013-10-09 NOTE — Progress Notes (Signed)
[facility] PROGRESS NOTE  Terrance JacquetCharles E Adams ZOX:[id number] DOB: [birth year] DOA: 10/08/2013 PCP: [name], MD  Assessment/Plan: 1. Rhabdomyolysis: 2ary to prolonged down town at house. - improving with IVF's - Will reassess CK levels next am   2. Weakness - PT evaluation pending, place order for up with assistance  3. Leukocytosis, unspecified - most likely a combination of stress reaction and dehydration  4. Knee pain, bilateral - from fall, Plain films of Knees = no Fracture, dx is Contusion   5. Normocytic anemia -Anemia panel next am.  6. +Troponin - No chest pain reported and currently within normal limits. Most likely due to recent breakdown of muscle cells (rhabdomyolysis)   7. Other  - Social Work Consult for Need for [employer] Options. PT recommending SNF  8. DVT Prophylaxis with Lovenox.   Code Status: DNR Family Communication: discussed with brothers at bedside. Disposition Plan: Pending improvement in condition.   Consultants:  PT  Procedures:  None  Antibiotics:  None   HPI/Subjective: No new complaints. No acute issues reported overnight.  Objective: Filed Vitals:   10/09/13 1200  BP: 142/67  Pulse: 56  Temp: 97.6 F (36.4 C)  Resp: 18    Intake/Output Summary (Last 24 hours) at 10/09/13 1651 Last data filed at 10/09/13 1300  Gross per 24 hour  Intake      0 ml  Output    425 ml  Net   -425 ml   Filed Weights   10/08/13 2145 10/09/13 0500  Weight: 61.644 kg (135 lb 14.4 oz) 60.827 kg (134 lb 1.6 oz)    Exam:   General:  Pt in NAD, alert and awake  Cardiovascular: rrr, no mrg  Respiratory: cta bl, no wheezes  Abdomen: soft, NT, ND  Musculoskeletal: no cyanosis or clubbing   Data Reviewed: Basic Metabolic Panel:  Recent Labs Lab 10/08/13 1916 10/09/13 0630  NA 143 143  K 4.0 3.9  CL 105 108  CO2 23 22  GLUCOSE 129* 90  BUN 25* 18  CREATININE 1.01 0.87  CALCIUM 8.8 7.9*   Liver Function  Tests:  Recent Labs Lab 10/08/13 1916  AST 64*  ALT 22  ALKPHOS 73  BILITOT 0.3  PROT 6.8  ALBUMIN 3.3*   No results found for this basename: LIPASE, AMYLASE,  in the last 168 hours No results found for this basename: AMMONIA,  in the last 168 hours CBC:  Recent Labs Lab 10/08/13 1916 10/09/13 0630  WBC 13.0* 9.3  NEUTROABS 9.7*  --   HGB 10.8* 9.8*  HCT 33.0* 30.1*  MCV 88.9 89.6  PLT 251 209   Cardiac Enzymes:  Recent Labs Lab 10/08/13 1916 10/08/13 2217 10/09/13 0630 10/09/13 1050  CKTOTAL 3940*  --   --   --   TROPONINI 0.35* 0.36* 0.34* <0.30   BNP (last 3 results) No results found for this basename: PROBNP,  in the last 8760 hours CBG: No results found for this basename: GLUCAP,  in the last 168 hours  No results found for this or any previous visit (from the past 240 hour(s)).   Studies: Dg Chest 2 View  10/08/2013   CLINICAL DATA:  Fall, weakness.  EXAM: CHEST  2 VIEW  COMPARISON:  PA and lateral chest 03/27/2011.  FINDINGS: Asymmetric elevation of the left hemidiaphragm is unchanged. There is no focal airspace disease or effusion. No pneumothorax is identified. Heart size is normal.  IMPRESSION: No acute disease.  Stable compared to prior exam.  Electronically Signed   By: Drusilla Kanner M.D.   On: 10/08/2013 21:40   Dg Knee 2 Views Left  10/08/2013   CLINICAL DATA:  Status post fall.  Left knee pain.  EXAM: LEFT KNEE - 1-2 VIEW  COMPARISON:  None.  FINDINGS: There is no acute bony or joint abnormality. No joint effusion is identified. Joint spaces are preserved. Chondrocalcinosis is noted.  IMPRESSION: No acute finding.  Chondrocalcinosis.   Electronically Signed   By: Drusilla Kanner M.D.   On: 10/08/2013 21:40   Dg Knee 2 Views Right  10/08/2013   CLINICAL DATA:  Bilateral knee pain after a fall.  Weakness.  EXAM: RIGHT KNEE - 1-2 VIEW  COMPARISON:  None.  FINDINGS: Degenerative changes in the knee with joint space calcification consistent with  chondrocalcinosis. No evidence of acute fracture or dislocation. No focal bone lesion or bone destruction. Vascular calcifications. No effusion.  IMPRESSION: Degenerative changes in the right knee with chondrocalcinosis. No acute bony abnormality suggested.   Electronically Signed   By: Burman Nieves M.D.   On: 10/08/2013 21:41   Ct Head Wo Contrast  10/08/2013   CLINICAL DATA:  Fall.  EXAM: CT HEAD WITHOUT CONTRAST  TECHNIQUE: Contiguous axial images were obtained from the base of the skull through the vertex without intravenous contrast.  COMPARISON:  MRI brain 03/28/2011.  Head CT 03/27/2011.  FINDINGS: No mass. No hydrocephalus. No hemorrhage. No acute bony abnormality. Cerebral vascular disease. Mucosal thickening and ethmoidal sinuses. Opacification right maxillary sinus. These present cyst with sinusitis. Sclerotic changes noted a right mastoid this could be from old infection. No acute bony abnormality identified.  IMPRESSION: 1. No acute intracranial abnormality. 2. Opacification right maxillary sinus and mucosal thickening of the ethmoidal sinuses consistent with sinusitis. 3. Sclerosis of the right mastoid most likely from old infection.   Electronically Signed   By: Maisie Fus  Register   On: 10/08/2013 20:37    Scheduled Meds: . enoxaparin (LOVENOX) injection  40 mg Subcutaneous Q24H   Continuous Infusions: . sodium chloride 75 mL/hr at 10/08/13 2310     Time spent: > 35 minutes    Terrance Adams  [facility] Pager [phone] If 7PM-7AM, please contact night-coverage at [email or web address], password [facility] 10/09/2013, 4:51 PM  LOS: 1 day

## 2013-10-10 LAB — RETICULOCYTES
RBC.: 3.63 MIL/uL — ABNORMAL LOW (ref 4.22–5.81)
Retic Count, Absolute: 47.2 10*3/uL (ref 19.0–186.0)
Retic Ct Pct: 1.3 % (ref 0.4–3.1)

## 2013-10-10 LAB — CBC
HEMATOCRIT: 33.2 % — AB (ref 39.0–52.0)
Hemoglobin: 10.9 g/dL — ABNORMAL LOW (ref 13.0–17.0)
MCH: 29.9 pg (ref 26.0–34.0)
MCHC: 32.8 g/dL (ref 30.0–36.0)
MCV: 91 fL (ref 78.0–100.0)
Platelets: 219 10*3/uL (ref 150–400)
RBC: 3.65 MIL/uL — AB (ref 4.22–5.81)
RDW: 13.6 % (ref 11.5–15.5)
WBC: 11.3 10*3/uL — ABNORMAL HIGH (ref 4.0–10.5)

## 2013-10-10 LAB — BASIC METABOLIC PANEL
Anion gap: 15 (ref 5–15)
BUN: 15 mg/dL (ref 6–23)
CO2: 20 mEq/L (ref 19–32)
Calcium: 8 mg/dL — ABNORMAL LOW (ref 8.4–10.5)
Chloride: 106 mEq/L (ref 96–112)
Creatinine, Ser: 0.79 mg/dL (ref 0.50–1.35)
GFR calc Af Amer: 89 mL/min — ABNORMAL LOW (ref >90)
GFR, EST NON AFRICAN AMERICAN: 77 mL/min — AB (ref >90)
GLUCOSE: 104 mg/dL — AB (ref 70–99)
POTASSIUM: 3.9 meq/L (ref 3.7–5.3)
Sodium: 141 mEq/L (ref 137–147)

## 2013-10-10 LAB — IRON AND TIBC
IRON: 23 ug/dL — AB (ref 42–135)
SATURATION RATIOS: 12 % — AB (ref 20–55)
TIBC: 189 ug/dL — ABNORMAL LOW (ref 215–435)
UIBC: 166 ug/dL (ref 125–400)

## 2013-10-10 LAB — VITAMIN B12: Vitamin B-12: 122 pg/mL — ABNORMAL LOW (ref 211–911)

## 2013-10-10 LAB — FOLATE: Folate: 17.6 ng/mL

## 2013-10-10 LAB — CK: Total CK: 3621 U/L — ABNORMAL HIGH (ref 7–232)

## 2013-10-10 LAB — FERRITIN: Ferritin: 134 ng/mL (ref 22–322)

## 2013-10-10 NOTE — Plan of Care (Signed)
Problem: Consults Goal: Skin Care Protocol Initiated - if Braden Score 18 or less If consults are not indicated, leave blank or document N/A  Outcome: Completed/Met Date Met:  10/10/13 Barrier cream & q2 turns added

## 2013-10-10 NOTE — Progress Notes (Signed)
Pt is having ocassional bigeminy pvcs. Pt asymptomatic & asleep. MD on call notified. No new orders given at this time. Will continue to monitor the pt. Sanda LingerMilam, Krisha Beegle R, RN

## 2013-10-10 NOTE — Progress Notes (Signed)
[facility] PROGRESS NOTE  Terrance JacquetCharles E Adams GNF:[id number] DOB: [birth year] DOA: 10/08/2013 PCP: [name], MD  Assessment/Plan: 1. Rhabdomyolysis: 2ary to prolonged down town at house. - improving with IVF's, we'll continue - Will reassess CK levels next am   2. Weakness - PT evaluation recommending skilled nursing facility placement. Discussed with family  3. Leukocytosis, unspecified - most likely a combination of stress reaction and dehydration  4. Knee pain, bilateral - from fall, Plain films of Knees = no Fracture, dx is Contusion   5. Normocytic anemia -Anemia panel pending  6. +Troponin - No chest pain reported and currently within normal limits. Most likely due to recent breakdown of muscle cells (rhabdomyolysis)  - Last troponin within normal limits  7. Other  - Social Work Librarian, academicConsult for placement. PT recommending SNF  8. DVT Prophylaxis with Lovenox.   Code Status: DNR Family Communication: discussed with brother Disposition Plan: Pending improvement in condition.   Consultants:  PT  Procedures:  None  Antibiotics:  None   HPI/Subjective: No new complaints. No acute issues reported overnight.  Objective: Filed Vitals:   10/10/13 1345  BP: 139/58  Pulse: 62  Temp: 97.5 F (36.4 C)  Resp: 18    Intake/Output Summary (Last 24 hours) at 10/10/13 1614 Last data filed at 10/10/13 1523  Gross per 24 hour  Intake   1110 ml  Output   1052 ml  Net     58 ml   Filed Weights   10/08/13 2145 10/09/13 0500 10/10/13 0522  Weight: 61.644 kg (135 lb 14.4 oz) 60.827 kg (134 lb 1.6 oz) 61.916 kg (136 lb 8 oz)    Exam:   General:  Pt in NAD, alert and awake  Cardiovascular: rrr, no mrg  Respiratory: cta bl, no wheezes  Abdomen: soft, NT, ND  Musculoskeletal: no cyanosis or clubbing   Data Reviewed: Basic Metabolic Panel:  Recent Labs Lab 10/08/13 1916 10/09/13 0630 10/10/13 0710  NA 143 143 141  K 4.0 3.9 3.9  CL 105 108 106   CO2 23 22 20   GLUCOSE 129* 90 104*  BUN 25* 18 15  CREATININE 1.01 0.87 0.79  CALCIUM 8.8 7.9* 8.0*   Liver Function Tests:  Recent Labs Lab 10/08/13 1916  AST 64*  ALT 22  ALKPHOS 73  BILITOT 0.3  PROT 6.8  ALBUMIN 3.3*   No results found for this basename: LIPASE, AMYLASE,  in the last 168 hours No results found for this basename: AMMONIA,  in the last 168 hours CBC:  Recent Labs Lab 10/08/13 1916 10/09/13 0630 10/10/13 0710  WBC 13.0* 9.3 11.3*  NEUTROABS 9.7*  --   --   HGB 10.8* 9.8* 10.9*  HCT 33.0* 30.1* 33.2*  MCV 88.9 89.6 91.0  PLT 251 209 219   Cardiac Enzymes:  Recent Labs Lab 10/08/13 1916 10/08/13 2217 10/09/13 0630 10/09/13 1050 10/10/13 0710  CKTOTAL 3940*  --   --   --  3621*  TROPONINI 0.35* 0.36* 0.34* <0.30  --    BNP (last 3 results) No results found for this basename: PROBNP,  in the last 8760 hours CBG: No results found for this basename: GLUCAP,  in the last 168 hours  No results found for this or any previous visit (from the past 240 hour(s)).   Studies: Dg Chest 2 View  10/08/2013   CLINICAL DATA:  Fall, weakness.  EXAM: CHEST  2 VIEW  COMPARISON:  PA and lateral chest 03/27/2011.  FINDINGS: Asymmetric  elevation of the left hemidiaphragm is unchanged. There is no focal airspace disease or effusion. No pneumothorax is identified. Heart size is normal.  IMPRESSION: No acute disease.  Stable compared to prior exam.   Electronically Signed   By: Drusilla Kanner M.D.   On: 10/08/2013 21:40   Dg Knee 2 Views Left  10/08/2013   CLINICAL DATA:  Status post fall.  Left knee pain.  EXAM: LEFT KNEE - 1-2 VIEW  COMPARISON:  None.  FINDINGS: There is no acute bony or joint abnormality. No joint effusion is identified. Joint spaces are preserved. Chondrocalcinosis is noted.  IMPRESSION: No acute finding.  Chondrocalcinosis.   Electronically Signed   By: Drusilla Kanner M.D.   On: 10/08/2013 21:40   Dg Knee 2 Views Right  10/08/2013    CLINICAL DATA:  Bilateral knee pain after a fall.  Weakness.  EXAM: RIGHT KNEE - 1-2 VIEW  COMPARISON:  None.  FINDINGS: Degenerative changes in the knee with joint space calcification consistent with chondrocalcinosis. No evidence of acute fracture or dislocation. No focal bone lesion or bone destruction. Vascular calcifications. No effusion.  IMPRESSION: Degenerative changes in the right knee with chondrocalcinosis. No acute bony abnormality suggested.   Electronically Signed   By: Burman Nieves M.D.   On: 10/08/2013 21:41   Ct Head Wo Contrast  10/08/2013   CLINICAL DATA:  Fall.  EXAM: CT HEAD WITHOUT CONTRAST  TECHNIQUE: Contiguous axial images were obtained from the base of the skull through the vertex without intravenous contrast.  COMPARISON:  MRI brain 03/28/2011.  Head CT 03/27/2011.  FINDINGS: No mass. No hydrocephalus. No hemorrhage. No acute bony abnormality. Cerebral vascular disease. Mucosal thickening and ethmoidal sinuses. Opacification right maxillary sinus. These present cyst with sinusitis. Sclerotic changes noted a right mastoid this could be from old infection. No acute bony abnormality identified.  IMPRESSION: 1. No acute intracranial abnormality. 2. Opacification right maxillary sinus and mucosal thickening of the ethmoidal sinuses consistent with sinusitis. 3. Sclerosis of the right mastoid most likely from old infection.   Electronically Signed   By: Maisie Fus  Register   On: 10/08/2013 20:37    Scheduled Meds: . enoxaparin (LOVENOX) injection  40 mg Subcutaneous Q24H   Continuous Infusions: . sodium chloride 75 mL/hr at 10/09/13 2154     Time spent: > 35 minutes    Penny Pia  [facility] Pager [phone] If 7PM-7AM, please contact night-coverage at [email or web address], password [facility] 10/10/2013, 4:14 PM  LOS: 2 days

## 2013-10-11 LAB — URINALYSIS, ROUTINE W REFLEX MICROSCOPIC
Bilirubin Urine: NEGATIVE
GLUCOSE, UA: NEGATIVE mg/dL
Ketones, ur: 15 mg/dL — AB
Nitrite: POSITIVE — AB
PH: 7 (ref 5.0–8.0)
PROTEIN: NEGATIVE mg/dL
SPECIFIC GRAVITY, URINE: 1.019 (ref 1.005–1.030)
Urobilinogen, UA: 0.2 mg/dL (ref 0.0–1.0)

## 2013-10-11 LAB — URINE MICROSCOPIC-ADD ON

## 2013-10-11 NOTE — Progress Notes (Signed)
Physical Therapy Treatment Patient Details Name: Terrance Adams MRN: [id number] DOB: 02/25/1922 Today's Date: 10/11/2013    History of Present Illness Patient is a 80 yo male admitted 10/08/13 after being found on his floor by his brothers.  He had fallen and been in the floor over night.  Patient c/o bil. knee pain.  Patient with dehydration and slightly elevated troponins.  No significant PMH.    PT Comments    Patient lethargic upon entering room and requires maximal verbal and tactile cues to arouse. Maximal encouragement to participate in therapy. Pt with increased time to perform all mobility. Delayed processing and response time to questions asked. Anxious about mobility. Pt responded well to having family involved during treatment. Tolerated gait training today but fatigues quickly and complained of dizziness post gait. Education provided to family on importance of sitting upright and with slowly increasing intensity of activity. Will continue to follow.   Follow Up Recommendations  SNF;Supervision/Assistance - 24 hour     Equipment Recommendations  Rolling walker with 5" wheels    Recommendations for Other Services       Precautions / Restrictions Precautions Precautions: Fall Restrictions Weight Bearing Restrictions: No    Mobility  Bed Mobility Overal bed mobility: Needs Assistance Bed Mobility: Rolling;Supine to Sit;Sit to Supine Rolling: Mod assist   Supine to sit: Max assist;HOB elevated Sit to supine: Total assist;+2 for physical assistance;HOB elevated   General bed mobility comments: Max encouragment and cueing to participate with PT. Took 3-4 minutes to arouse patient. Assist to mobilize BLEs to EOB and with trunk to sitting position. Able to sit EOB Min guard assist. Total A to return to supine.  Transfers Overall transfer level: Needs assistance Equipment used: Rolling walker (2 wheeled) Transfers: Sit to/from Stand Sit to Stand: Max assist;From  elevated surface         General transfer comment: VC for technique. Pt with flexed posture and increased knee flexion B. Manual cues to extend hips and for upright posture. Sit to stand x2 from EOB.  Ambulation/Gait Ambulation/Gait assistance: Min assist Ambulation Distance (Feet): 75 Feet Assistive device: Rolling walker (2 wheeled) Gait Pattern/deviations: Step-through pattern;Decreased stride length;Trunk flexed;Narrow base of support   Gait velocity interpretation: Below normal speed for age/gender General Gait Details: Manual assist with walker management to promote forward momentum with ambulation. Reports dizziness post ambulation.   Stairs            Wheelchair Mobility    Modified Rankin (Stroke Patients Only)       Balance Overall balance assessment: Needs assistance Sitting-balance support: Bilateral upper extremity supported;Feet supported Sitting balance-Leahy Scale: Fair     Standing balance support: Bilateral upper extremity supported;During functional activity Standing balance-Leahy Scale: Poor Standing balance comment: Use of RW for support. Able to stand with LEs supported on bed rail for 1 minute. Performed marching in standing 30 sec with assist for weightshifting initially.                    Cognition Arousal/Alertness: Lethargic Behavior During Therapy: Anxious Overall Cognitive Status: History of cognitive impairments - at baseline                      Exercises General Exercises - Lower Extremity Straight Leg Raises: 10 reps;Both;Seated Hip Flexion/Marching: Both;10 reps;Seated    General Comments        Pertinent Vitals/Pain 0/10 pain reported. SOB present post ambulation. Vitals stable.    Home  Living                      Prior Function            PT Goals (current goals can now be found in the care plan section) Progress towards PT goals: Progressing toward goals    Frequency  Min 2X/week     PT Plan Frequency needs to be updated    Co-evaluation             End of Session Equipment Utilized During Treatment: Gait belt Activity Tolerance: Patient limited by fatigue Patient left: in bed;with call bell/phone within reach;with bed alarm set;with family/visitor present     Time: [id number] PT Time Calculation (min): 41 min  Charges:  $Gait Training: 8-22 mins $Therapeutic Exercise: 8-22 mins $Therapeutic Activity: 8-22 mins                    G CodesAlvie Adams:      Adams, Terrance Gehres A 10/11/2013, 4:29 PM Terrance HeidelbergShauna Adams, PT, DPT [phone]

## 2013-10-11 NOTE — Progress Notes (Addendum)
Clinical Social Work Department CLINICAL SOCIAL WORK PLACEMENT NOTE 10/11/2013  Patient:  Terrance JacquetMYERS,[name]  Account Number:  [id number] Admit date:  10/08/2013  Clinical Social Worker:  Sharol HarnessPOONUM Panfilo Ketchum, Theresia MajorsLCSWA  Date/time:  10/11/2013 10:45 AM  Clinical Social Work is seeking post-discharge placement for this patient at the following level of care:   SKILLED NURSING   (*CSW will update this form in Epic as items are completed)   10/11/2013  [facility]/family provided with [facility] of Clinical Social Work's list of facilities offering this level of care within the geographic area requested by the patient (or if unable, by the patient's family).  10/11/2013  Patient/family informed of their freedom to choose among providers that offer the needed level of care, that participate in Medicare, Medicaid or managed care program needed by the patient, have an available bed and are willing to accept the patient.  10/11/2013  Patient/family informed of [name]' ownership interest in [facility], as well as of the fact that they are under no obligation to receive care at this facility.  PASARR submitted to [name] on 10/11/2013 PASARR number received on 10/11/2013  [name] transmitted to all facilities in geographic area requested by pt/family on  10/11/2013 [name] transmitted to all facilities within larger geographic area on   Patient informed that his/her managed care company has contracts with or will negotiate with  certain facilities, including the following:     Patient/family informed of bed offers received: 10/11/2013  [facility] and patient chooses bed [facility]    [facility]  Patient to be transferred to facility by [facility] Patient and family notified of transfer on 10/12/2013 Name of family member notified:  Hazle CocaJoseph Kilian  The following physician request were entered in Epic: Physician Request   Please sign [name].    Additional CommentsSharol Harness:   Camyla Camposano, LCSWA [phone]

## 2013-10-11 NOTE — Progress Notes (Signed)
Clinical Social Work Department BRIEF PSYCHOSOCIAL ASSESSMENT 10/11/2013  Patient:  Terrance Adams,Terrance Adams     Account Number:  [id number]     Admit date:  10/08/2013  Clinical Social Worker:  Harless NakayamaAMBELAL,Heran Campau, LCSWA  Date/Time:  10/11/2013 10:30 AM  Referred by:  Physician  Date Referred:  10/11/2013 Referred for  SNF Placement   Other Referral:   Interview type:  Family Other interview type:   Spoke with pt brothers outside of pt room    PSYCHOSOCIAL DATA Living Status:  ALONE Admitted from facility:   Level of care:   Primary support name:  Terrance Adams [phone] Primary support relationship to patient:  SIBLING Degree of support available:   Pt has good support from family    CURRENT CONCERNS Current Concerns  Post-Acute Placement   Other Concerns:    SOCIAL WORK ASSESSMENT / PLAN CSW visited pt room and introduced self. Pt brothers Terrance Adams and Terrance Adams both present and aware of CSW consult and requested to speak with CSW outside of pt room. Pt brothers informed CSW that pt was living alone prior to admission but they have concerns about pt returning home alone now. CSW explained PT recommendation and SNF referral process. Pt brothers unaware of facilities in area and are open to [place] being to [facility] [place] [facility]. Pt brothers did have concerns about pt going to "nursing home". CSW explained that this is where ST rehab is offered but most facilities to try to separate long term and short term rehab residents. Pt brothers understanding of this. CSW to visit room later today with bed offers. Pt brother confirmed he would be present through out the day.   Assessment/plan status:  Psychosocial Support/Ongoing Assessment of Needs Other assessment/ plan:   Information/referral to community resources:   SNF list to be provided with bed offers    PATIENT'S/FAMILY'S RESPONSE TO PLAN OF CARE: [facility]        Terrance Adams Terrance Adams, LCSWA [phone]

## 2013-10-11 NOTE — Progress Notes (Signed)
[facility] PROGRESS NOTE  Terrance Adams FGH:[id number] DOB: 07/06/1921 DOA: 10/08/2013 PCP: Scarlette Calico, MD  Assessment/Plan: 1. Rhabdomyolysis: 2ary to prolonged down town at house. - improving with IVF's, we'll continue - Will reassess CK levels next am   2. Weakness - PT evaluation recommending skilled nursing facility placement. Discussed with family  3. Leukocytosis, unspecified - most likely a combination of stress reaction and dehydration - reassess next am.  4. Knee pain, bilateral - from fall, Plain films of Knees = no Fracture, dx is Contusion   5. Normocytic anemia -Anemia panel pending  6. +Troponin - No chest pain reported and currently within normal limits. Most likely due to recent breakdown of muscle cells (rhabdomyolysis)  - Last troponin within normal limits  7. Other  - Social Work Scientific laboratory technician for placement. PT recommending SNF  8. DVT Prophylaxis with Lovenox.   Code Status: DNR Family Communication: discussed with brother Disposition Plan: Pending improvement in condition.   Consultants:  PT  Procedures:  None  Antibiotics:  None   HPI/Subjective: No new complaints. No acute issues reported overnight. Brother reports that they have met with Education officer, museum and are considering different places for rehab  Objective: Filed Vitals:   10/11/13 1201  BP: 151/61  Pulse: 72  Temp: 99.9 F (37.7 C)  Resp: 14    Intake/Output Summary (Last 24 hours) at 10/11/13 1757 Last data filed at 10/11/13 1211  Gross per 24 hour  Intake    420 ml  Output    676 ml  Net   -256 ml   Filed Weights   10/08/13 2145 10/09/13 0500 10/10/13 0522  Weight: 61.644 kg (135 lb 14.4 oz) 60.827 kg (134 lb 1.6 oz) 61.916 kg (136 lb 8 oz)    Exam:   General:  Pt in NAD, alert and awake  Cardiovascular: rrr, no mrg  Respiratory: cta bl, no wheezes  Abdomen: soft, NT, ND  Musculoskeletal: no cyanosis or clubbing   Data Reviewed: Basic Metabolic  Panel:  Recent Labs Lab 10/08/13 1916 10/09/13 0630 10/10/13 0710  NA 143 143 141  K 4.0 3.9 3.9  CL 105 108 106  CO2 23 22 20   GLUCOSE 129* 90 104*  BUN 25* 18 15  CREATININE 1.01 0.87 0.79  CALCIUM 8.8 7.9* 8.0*   Liver Function Tests:  Recent Labs Lab 10/08/13 1916  AST 64*  ALT 22  ALKPHOS 73  BILITOT 0.3  PROT 6.8  ALBUMIN 3.3*   No results found for this basename: LIPASE, AMYLASE,  in the last 168 hours No results found for this basename: AMMONIA,  in the last 168 hours CBC:  Recent Labs Lab 10/08/13 1916 10/09/13 0630 10/10/13 0710  WBC 13.0* 9.3 11.3*  NEUTROABS 9.7*  --   --   HGB 10.8* 9.8* 10.9*  HCT 33.0* 30.1* 33.2*  MCV 88.9 89.6 91.0  PLT 251 209 219   Cardiac Enzymes:  Recent Labs Lab 10/08/13 1916 10/08/13 2217 10/09/13 0630 10/09/13 1050 10/10/13 0710  CKTOTAL 3940*  --   --   --  3621*  TROPONINI 0.35* 0.36* 0.34* <0.30  --    BNP (last 3 results) No results found for this basename: PROBNP,  in the last 8760 hours CBG: No results found for this basename: GLUCAP,  in the last 168 hours  No results found for this or any previous visit (from the past 240 hour(s)).   Studies: No results found.  Scheduled Meds: . enoxaparin (LOVENOX) injection  40 mg Subcutaneous Q24H   Continuous Infusions: . sodium chloride 75 mL/hr at 10/11/13 0453     Time spent: > 35 minutes    Velvet Bathe  [facility] Pager [phone] If 7PM-7AM, please contact night-coverage at [email or web address], password [facility] 10/11/2013, 5:57 PM  LOS: 3 days

## 2013-10-12 ENCOUNTER — Encounter (HOSPITAL_COMMUNITY): Payer: Self-pay | Admitting: General Practice

## 2013-10-12 LAB — CBC
HCT: 31.5 % — ABNORMAL LOW (ref 39.0–52.0)
HEMOGLOBIN: 10.4 g/dL — AB (ref 13.0–17.0)
MCH: 30 pg (ref 26.0–34.0)
MCHC: 33 g/dL (ref 30.0–36.0)
MCV: 90.8 fL (ref 78.0–100.0)
Platelets: 219 10*3/uL (ref 150–400)
RBC: 3.47 MIL/uL — ABNORMAL LOW (ref 4.22–5.81)
RDW: 13.9 % (ref 11.5–15.5)
WBC: 11.9 10*3/uL — ABNORMAL HIGH (ref 4.0–10.5)

## 2013-10-12 LAB — CK: CK TOTAL: 2172 U/L — AB (ref 7–232)

## 2013-10-12 MED ORDER — ENSURE PUDDING PO PUDG: 1.0000 | Freq: Three times a day (TID) | ORAL | Status: DC

## 2013-10-12 MED ORDER — CEFDINIR 300 MG PO CAPS: 300.0000 mg | ORAL_CAPSULE | Freq: Two times a day (BID) | ORAL | Status: DC

## 2013-10-12 MED ORDER — TRAMADOL HCL 50 MG PO TABS
50.0000 mg | ORAL_TABLET | Freq: Four times a day (QID) | ORAL | Status: DC | PRN
Start: 2013-10-12 — End: 2013-12-17

## 2013-10-12 NOTE — Progress Notes (Signed)
CARE MANAGEMENT NOTE 10/12/2013  Patient:  Terrance Adams,Terrance Adams   Account Number:  [id number]  Date Initiated:  10/12/2013  Documentation initiated by:  GRAVES-BIGELOW,Jaret Coppedge  Subjective/Objective Assessment:   Pt admitted for fall at home resulting in Rhabdomyolysis: Plan for SNF when medically stable for d/c.     Action/Plan:   CSW assisting with disposition needs. Plan for d/c today.   Anticipated DC Date:  10/12/2013   Anticipated DC Plan:  SKILLED NURSING FACILITY  In-house referral  Clinical Social Worker      DC Planning Services  CM consult      Choice offered to / List presented to:             Status of service:  Completed, signed off Medicare Important Message given?  YES (If response is "NO", the following Medicare IM given date fields will be blank) Date Medicare IM given:  10/12/2013 Medicare IM given by:  GRAVES-BIGELOW,Griffith Santilli Date Additional Medicare IM given:   Additional Medicare IM given by:    Discharge Disposition:  SKILLED NURSING FACILITY  Per UR Regulation:  Reviewed for med. necessity/level of care/duration of stay  If discussed at Long Length of Stay Meetings, dates discussed:    Comments:

## 2013-10-12 NOTE — Progress Notes (Signed)
CSW (Clinical Child psychotherapistocial Worker) prepared pt dc packet and placed with shadow chart. CSW arranged non-emergent ambulance transport. Pt, pt brother, pt nurse, and facility informed. CSW signing off.  Halla Chopp, LCSWA [phone]

## 2013-10-12 NOTE — Discharge Summary (Signed)
Physician Discharge Summary  Terrance Adams DGU:[id number] DOB: 05/14/1921 DOA: 10/08/2013  PCP: Sanda Linger, MD  Admit date: 10/08/2013 Discharge date: 10/12/2013  Time spent: > 35 minutes  Recommendations for Outpatient Follow-up:  1. Patient to continue physical therapy at SNF 2. Also recommending nutritional supplementation 3. UTI as such patient placed on oral cephalosporin  Discharge Diagnoses:  Principal Problem:   Rhabdomyolysis Active Problems:   Weakness   Leukocytosis, unspecified   Knee pain, bilateral   Normocytic anemia   Discharge Condition: stable  Diet recommendation: regular diet  Filed Weights   10/09/13 0500 10/10/13 0522 10/12/13 0606  Weight: 60.827 kg (134 lb 1.6 oz) 61.916 kg (136 lb 8 oz) 62.37 kg (137 lb 8 oz)    History of present illness:  89 who lives alone who presented to the hospital after being found on the floor for prolonged period of time.  Hospital Course:   1. Rhabdomyolysis: 2ary to prolonged down time at house.  - CK levels continue to improve - Place order for tramadol prn pain   2. Weakness  - PT evaluation recommending skilled nursing facility placement. Discussed with family currently electing [facility]  3. Leukocytosis, unspecified  -Most likely secondary to UTI, no cultures available - Will treat with oral third generation cephalosporin for the next 3 days  4. Knee pain, bilateral  - from fall, Plain films of Knees = no Fracture, dx is Contusion   5. Normocytic anemia  -Anemia panel pending   6. +Troponin  - No chest pain reported and currently within normal limits. Most likely due to recent breakdown of muscle cells (rhabdomyolysis)  - Last troponin within normal limits   7. Other  - Social Work Librarian, academic for placement. PT recommending SNF  Procedures:  Please see below  Consultations:  None  Discharge Exam: Filed Vitals:   10/12/13 1145  BP: 143/62  Pulse: 71  Temp: 97.7 F (36.5 C)  Resp: 16     General: Patient in no acute distress, alert and awake Cardiovascular: Regular rate and rhythm, no murmurs Respiratory: Clear to auscultation bilaterally no wheezes  Discharge Instructions You were cared for by a hospitalist during your hospital stay. If you have any questions about your discharge medications or the care you received while you were in the hospital after you are discharged, you can call the unit and asked to speak with the hospitalist on call if the hospitalist that took care of you is not available. Once you are discharged, your primary care physician will handle any further medical issues. Please note that NO REFILLS for any discharge medications will be authorized once you are discharged, as it is imperative that you return to your primary care physician (or establish a relationship with a primary care physician if you do not have one) for your aftercare needs so that they can reassess your need for medications and monitor your lab values.  Discharge Instructions   Call MD for:  severe uncontrolled pain    Complete by:  As directed      Call MD for:  temperature >100.4    Complete by:  As directed      Diet - low sodium heart healthy    Complete by:  As directed      Increase activity slowly    Complete by:  As directed             Medication List         feeding supplement (ENSURE) Pudg  Take 1 Container by mouth 3 (three) times daily between meals.     traMADol 50 MG tablet  Commonly known as:  ULTRAM  Take 1 tablet (50 mg total) by mouth every 6 (six) hours as needed for moderate pain.       No Known Allergies    The results of significant diagnostics from this hospitalization (including imaging, microbiology, ancillary and laboratory) are listed below for reference.    Significant Diagnostic Studies: Dg Chest 2 View  10/08/2013   CLINICAL DATA:  Fall, weakness.  EXAM: CHEST  2 VIEW  COMPARISON:  PA and lateral chest 03/27/2011.  FINDINGS:  Asymmetric elevation of the left hemidiaphragm is unchanged. There is no focal airspace disease or effusion. No pneumothorax is identified. Heart size is normal.  IMPRESSION: No acute disease.  Stable compared to prior exam.   Electronically Signed   By: Drusilla Kanner M.D.   On: 10/08/2013 21:40   Dg Knee 2 Views Left  10/08/2013   CLINICAL DATA:  Status post fall.  Left knee pain.  EXAM: LEFT KNEE - 1-2 VIEW  COMPARISON:  None.  FINDINGS: There is no acute bony or joint abnormality. No joint effusion is identified. Joint spaces are preserved. Chondrocalcinosis is noted.  IMPRESSION: No acute finding.  Chondrocalcinosis.   Electronically Signed   By: Drusilla Kanner M.D.   On: 10/08/2013 21:40   Dg Knee 2 Views Right  10/08/2013   CLINICAL DATA:  Bilateral knee pain after a fall.  Weakness.  EXAM: RIGHT KNEE - 1-2 VIEW  COMPARISON:  None.  FINDINGS: Degenerative changes in the knee with joint space calcification consistent with chondrocalcinosis. No evidence of acute fracture or dislocation. No focal bone lesion or bone destruction. Vascular calcifications. No effusion.  IMPRESSION: Degenerative changes in the right knee with chondrocalcinosis. No acute bony abnormality suggested.   Electronically Signed   By: Burman Nieves M.D.   On: 10/08/2013 21:41   Ct Head Wo Contrast  10/08/2013   CLINICAL DATA:  Fall.  EXAM: CT HEAD WITHOUT CONTRAST  TECHNIQUE: Contiguous axial images were obtained from the base of the skull through the vertex without intravenous contrast.  COMPARISON:  MRI brain 03/28/2011.  Head CT 03/27/2011.  FINDINGS: No mass. No hydrocephalus. No hemorrhage. No acute bony abnormality. Cerebral vascular disease. Mucosal thickening and ethmoidal sinuses. Opacification right maxillary sinus. These present cyst with sinusitis. Sclerotic changes noted a right mastoid this could be from old infection. No acute bony abnormality identified.  IMPRESSION: 1. No acute intracranial abnormality. 2.  Opacification right maxillary sinus and mucosal thickening of the ethmoidal sinuses consistent with sinusitis. 3. Sclerosis of the right mastoid most likely from old infection.   Electronically Signed   By: Maisie Fus  Register   On: 10/08/2013 20:37    Microbiology: No results found for this or any previous visit (from the past 240 hour(s)).   Labs: Basic Metabolic Panel:  Recent Labs Lab 10/08/13 1916 10/09/13 0630 10/10/13 0710  NA 143 143 141  K 4.0 3.9 3.9  CL 105 108 106  CO2 23 22 20   GLUCOSE 129* 90 104*  BUN 25* 18 15  CREATININE 1.01 0.87 0.79  CALCIUM 8.8 7.9* 8.0*   Liver Function Tests:  Recent Labs Lab 10/08/13 1916  AST 64*  ALT 22  ALKPHOS 73  BILITOT 0.3  PROT 6.8  ALBUMIN 3.3*   No results found for this basename: LIPASE, AMYLASE,  in the last 168 hours No results found  for this basename: AMMONIA,  in the last 168 hours CBC:  Recent Labs Lab 10/08/13 1916 10/09/13 0630 10/10/13 0710 10/12/13 0119  WBC 13.0* 9.3 11.3* 11.9*  NEUTROABS 9.7*  --   --   --   HGB 10.8* 9.8* 10.9* 10.4*  HCT 33.0* 30.1* 33.2* 31.5*  MCV 88.9 89.6 91.0 90.8  PLT 251 209 219 219   Cardiac Enzymes:  Recent Labs Lab 10/08/13 1916 10/08/13 2217 10/09/13 0630 10/09/13 1050 10/10/13 0710 10/12/13 0119  CKTOTAL 3940*  --   --   --  3621* 2172*  TROPONINI 0.35* 0.36* 0.34* <0.30  --   --    BNP: BNP (last 3 results) No results found for this basename: PROBNP,  in the last 8760 hours CBG: No results found for this basename: GLUCAP,  in the last 168 hours     Signed:  Penny PiaVEGA, Cielo Arias  [facility] 10/12/2013, 1:22 PM

## 2013-11-02 ENCOUNTER — Ambulatory Visit: Payer: Self-pay | Admitting: Podiatry

## 2013-11-09 ENCOUNTER — Ambulatory Visit: Payer: Self-pay | Admitting: Podiatry

## 2013-11-16 ENCOUNTER — Ambulatory Visit (INDEPENDENT_AMBULATORY_CARE_PROVIDER_SITE_OTHER): Payer: Medicare Other | Admitting: Podiatry

## 2013-11-16 ENCOUNTER — Encounter: Payer: Self-pay | Admitting: Podiatry

## 2013-11-16 VITALS — BP 130/76 | HR 85 | Resp 16

## 2013-11-16 DIAGNOSIS — IMO0002 Reserved for concepts with insufficient information to code with codable children: Secondary | ICD-10-CM

## 2013-11-16 DIAGNOSIS — B351 Tinea unguium: Secondary | ICD-10-CM

## 2013-11-16 NOTE — Progress Notes (Signed)
   Subjective:    Patient ID: Terrance Adams, male    DOB: [birth year], 80 y.o.   MRN: [id number]  HPI Comments: Patient presents to the office today with his brother for toenail complaints. Patient is a poor historian and does not speak much. Her the brother he was unaware when the nails on the right foot fell off. No other complaints. He currently lives in a rehabilitation facility due to the multiple falls.     Review of Systems  Skin:       Elongated toenails  All other systems reviewed and are negative.      Objective:   Physical Exam DP/PT pulses decreased.  Decreased protective sensation with a Semmes Weinstein monofilament Right hallux and second digit nails are not present. There is a small open area in the nail bed on the right hallux of pinpoint size. There is no surrounding erythema, drainage, clinical signs of infection. There is no probing. Nails hypertrophic, elongated, brittle, yellow-brown discoloration x8. Large serous bulla on the lateral aspect of the left heel. No surrounding erythema or other clinical signs of infection.       Assessment & Plan:  80 year old male with elongated hypertrophic nails x8, left lateral heel bulla, R hallux nail small opening on the nail bed -Nails and sharply debrided x8 without complication. -Antibiotic ointment and a Band-Aid applied to the right hallux nail. Continue with daily dressing changes -Left bulla intact as there is no clinical signs of infection recommend strict offloading. -Recommended strict offloading with Multi-Podus boots -Followup in 2 weeks for reevaluation or sooner if any problems are to arise

## 2013-11-16 NOTE — Patient Instructions (Signed)
Antibiotic ointment/bandaid to right hallux Strict offloading to heels (large bulla left heel). Recommend multipodis boots Monitor for any signs/symptoms of infection. Call the office immediately if any occur or go directly to the emergency room. Call with any questions/concerns.

## 2013-11-17 ENCOUNTER — Ambulatory Visit: Payer: Self-pay | Admitting: Cardiovascular Disease

## 2013-11-17 ENCOUNTER — Encounter: Payer: Self-pay | Admitting: Cardiovascular Disease

## 2013-11-17 ENCOUNTER — Ambulatory Visit (INDEPENDENT_AMBULATORY_CARE_PROVIDER_SITE_OTHER): Payer: Medicare Other | Admitting: Cardiovascular Disease

## 2013-11-17 VITALS — BP 125/76 | HR 78 | Ht 66.0 in | Wt 124.1 lb

## 2013-11-17 DIAGNOSIS — R9431 Abnormal electrocardiogram [ECG] [EKG]: Secondary | ICD-10-CM

## 2013-11-17 NOTE — Progress Notes (Signed)
11/17/2013 Terrance Adams   1921/10/27  [id number]  Primary Physician Julian Hy, MD Primary Cardiologist: Runell Gess MD Roseanne Reno   HPI:  Terrance Adams was referred by Dr. Evlyn Kanner who takes care of him at [facility]. It is unclear to me why he was referred other than the fact that he has abnormal EKG in his problem list. He was on no medications for hypertension, diabetes or hyperlipidemia. He is minimally ambulatory and is in a wheelchair today. He is hard of hearing and minimally communicative.   Current Outpatient Prescriptions  Medication Sig Dispense Refill  . aspirin 81 MG tablet Take 81 mg by mouth daily.      . feeding supplement, ENSURE, (ENSURE) PUDG Take 1 Container by mouth 3 (three) times daily between meals.  60 Can  0  . furosemide (LASIX) 20 MG tablet Take 20 mg by mouth daily.      . Levofloxacin (LEVAQUIN PO) Take by mouth as directed.      Marland Kitchen LORazepam (ATIVAN) 2 MG/ML concentrated solution Take 0.5 mg by mouth every 6 (six) hours as needed for anxiety.      . Morphine Sulfate (MORPHINE CONCENTRATE) 10 mg / 0.5 ml concentrated solution Take 20 mg by mouth every 6 (six) hours as needed for moderate pain, severe pain or shortness of breath.      . polyethylene glycol (MIRALAX / GLYCOLAX) packet Take 17 g by mouth daily.      . traMADol (ULTRAM) 50 MG tablet Take 1 tablet (50 mg total) by mouth every 6 (six) hours as needed for moderate pain.  30 tablet  0   No current facility-administered medications for this visit.    No Known Allergies  History   Social History  . Marital Status: Single    Spouse Name: N/A    Number of Children: N/A  . Years of Education: N/A   Occupational History  . Not on file.   Social History Main Topics  . Smoking status: Never Smoker   . Smokeless tobacco: Never Used  . Alcohol Use: No  . Drug Use: No  . Sexual Activity: Not on file   Other Topics Concern  . Not on file   Social  History Narrative  . No narrative on file     Review of Systems: General: negative for chills, fever, night sweats or weight changes.  Cardiovascular: negative for chest pain, dyspnea on exertion, edema, orthopnea, palpitations, paroxysmal nocturnal dyspnea or shortness of breath Dermatological: negative for rash Respiratory: negative for cough or wheezing Urologic: negative for hematuria Abdominal: negative for nausea, vomiting, diarrhea, bright red blood per rectum, melena, or hematemesis Neurologic: negative for visual changes, syncope, or dizziness All other systems reviewed and are otherwise negative except as noted above.    Blood pressure 125/76, pulse 78, height 5\' 6"  (1.676 m), weight 124 lb 1.6 oz (56.291 kg).  General appearance: alert and slowed mentation Neck: no adenopathy, no carotid bruit, no JVD, supple, symmetrical, trachea midline and thyroid not enlarged, symmetric, no tenderness/mass/nodules Lungs: clear to auscultation bilaterally Heart: regular rate and rhythm, S1, S2 normal, no murmur, click, rub or gallop Extremities: extremities normal, atraumatic, no cyanosis or edema  EKG normal sinus rhythm at 70 with borderline LVH  ASSESSMENT AND PLAN:   ABNORMAL ELECTROCARDIOGRAM The patient was apparently referred for an abnormal echocardiogram. His EKG today reveals sinus rhythm with borderline LVH.      Runell Gess MD FACP,FACC,FAHA,  FSCAI 11/17/2013 4:42 PM

## 2013-11-17 NOTE — Patient Instructions (Signed)
Follow up with Dr Berry as needed.  

## 2013-11-17 NOTE — Assessment & Plan Note (Signed)
The patient was apparently referred for an abnormal echocardiogram. His EKG today reveals sinus rhythm with borderline LVH.

## 2013-11-23 ENCOUNTER — Ambulatory Visit: Payer: Self-pay | Admitting: Podiatry

## 2013-11-30 ENCOUNTER — Ambulatory Visit (INDEPENDENT_AMBULATORY_CARE_PROVIDER_SITE_OTHER): Payer: Medicare Other | Admitting: Podiatry

## 2013-11-30 ENCOUNTER — Encounter: Payer: Self-pay | Admitting: Podiatry

## 2013-11-30 VITALS — BP 123/72 | HR 74 | Resp 18

## 2013-11-30 DIAGNOSIS — L84 Corns and callosities: Secondary | ICD-10-CM

## 2013-11-30 DIAGNOSIS — IMO0002 Reserved for concepts with insufficient information to code with codable children: Secondary | ICD-10-CM

## 2013-11-30 MED ORDER — SILVER SULFADIAZINE 1 % EX CREA: 1.0000 "application " | TOPICAL_CREAM | Freq: Every day | CUTANEOUS | Status: DC

## 2013-11-30 NOTE — Patient Instructions (Signed)
Continue with Silvadene dressing changes daily. Monitor for any signs/symptoms of infection. Call the office immediately if any occur or go directly to the emergency room. Call with any questions/concerns.

## 2013-11-30 NOTE — Progress Notes (Signed)
   Subjective:    Patient ID: Terrance Adams, male    DOB: 22-Jul-1921, 80 y.o.   MRN: [id number]  HPI   80 year old male returns to the office today with his brother for a followup evaluation of blister on left lateral heel. Patient's mother states that they have been continuing with offloading of the heels with Multi-Podus boots. Denies any drainage from around the area for any clinical signs or symptoms of infection. No new complaints.   Review of Systems  All other systems reviewed and are negative.      Objective:   Physical Exam Awake, NAD DP/PT pulses decreased Decreased Achilles tendon reflex/protective sensation Left lateral heel with thick hyperkeratotic lesion at the site of the prior bulla. Hyperkeratotic tissue sharply debrided without complication. Underlying skin was intact there was a small central area underneath the hyperkeratotic lesion which is height is slightly hyperpigmented. Over this area between the skin and the callus a small amount of serous fluid. Then underlying skin did not reveal any ulcerations. No surrounding erythema, ascending cellulitis, fluctuance or crepitus. Right hallux nail bed and prior open lesion healed with no sign erythema or drainage or other signs of infection.        Assessment & Plan:  80 year old male left lateral heel hyperkeratotic lesion from blister. -Keratotic tissue sharply debrided without complications. Underlying skin intact with a central area of slight hyperpigmentation which had a small amount of serous fluid between the skin and callus. I know wound and no clinical signs of infection. -Continue with Silvadene and daily dressing changes. -Continue with Multi-Podus boots. -Monitor for any clinical signs or symptoms of infection and directed to call the office immediately or go directly to the emergency room if any are to occur. -Followup in 2 weeks to recheck wound  or sooner if any problems are to arise or change in  symptoms.

## 2013-12-12 ENCOUNTER — Ambulatory Visit: Payer: Self-pay | Admitting: Cardiology

## 2013-12-14 ENCOUNTER — Telehealth: Payer: Self-pay | Admitting: *Deleted

## 2013-12-14 ENCOUNTER — Ambulatory Visit (INDEPENDENT_AMBULATORY_CARE_PROVIDER_SITE_OTHER): Payer: Medicare Other | Admitting: Podiatry

## 2013-12-14 ENCOUNTER — Encounter: Payer: Self-pay | Admitting: Podiatry

## 2013-12-14 VITALS — BP 111/64 | HR 80 | Resp 18

## 2013-12-14 DIAGNOSIS — L97509 Non-pressure chronic ulcer of other part of unspecified foot with unspecified severity: Secondary | ICD-10-CM

## 2013-12-14 NOTE — Telephone Encounter (Signed)
His brother called Dr. Ardelle Anton and he has Silvadene Cream in the home for a heel wound.  I just need to verify that and get a verbal order to put in our plan of care and I can send supplies to him.  Please give me a call.  Leave a voicemail if I don't answer, thank you.

## 2013-12-14 NOTE — Telephone Encounter (Signed)
I returned her call and informed her that the patient is to do daily dressing changes with Silvadene.  She said, "Thanks for calling we'll get him the necessary supplies he needs.  If you make any changes to his orders, you can call me or call [facility]."

## 2013-12-14 NOTE — Patient Instructions (Signed)
Continue to wear offloading boots to help take pressure off the heel wound.  Continue daily dressing changes.  Monitor for any signs/symptoms of infection. Call the office immediately if any occur or go directly to the emergency room. Call with any questions/concerns.

## 2013-12-14 NOTE — Progress Notes (Signed)
   Subjective:    Patient ID: Terrance Adams, male    DOB: [birth year], 80 y.o.   MRN: [id number]  HPI SON STATES THAT THE NURSE CAME OUT LAST NIGHT AND CHANGED THE BANDAGE AND STATED IT DID NOT LOOK INFECTED AND I CHANGED THE BANDAGE THIS MORNING AND THE NURSE SAID IF WE COULD GET SOME BODY TO SEND OUT SUPPLIES SHE WOULD WRAP THE HEEL   Terrance Adams, 80 year old male, presents the office today with his brother for followup evaluation of left posterior on the heel. Patient's father states that he looks that the heel periodically continue the dressing with the Silvadene. States that he continues to wear the offloading boot as much as possible. Denies any recent fevers, chills, nausea, vomiting. No new complaints at this time.    Review of Systems     Objective:   Physical Exam Awake, NAD- patient appears to be more awake and alert today than before. He also walked into the office today and would acknowledge questions.  DP/PT pulses decreased Neurological status unchanged. Left lateral heel with 1.4 x 1 cm superficial wound with central eschar. There is no fluctuance, crepitus. There is no surrounding erythema or any ascending cellulitis. No malodor. No blister formation this time. Right hallux nail bed unchanged. No clinical signs of infection. Leg pain, swelling, warmth.      Assessment & Plan:  80 year old male with left lateral heel superficial ulceration with central eschar, likely from pressure. -Various treatment options discussed including alternatives, risks, complications. -At this time there is no clinical signs of infection. -Continue with daily dressing changes. I ordered supplies to be sent to the patient's house. -Continue with offloading and the Multi-Podus boot. -Monitor for any clinical signs or symptoms of infection and directed to call immediately if any are to occur or go directly to the emergency room. -Followup in 2 weeks or sooner if any problems are to arise or any  changes symptoms. In the meantime continue to have the nurse look at the heel. If there any changes to call the office immediately.

## 2013-12-17 ENCOUNTER — Encounter (HOSPITAL_COMMUNITY): Payer: Self-pay | Admitting: Emergency Medicine

## 2013-12-17 ENCOUNTER — Inpatient Hospital Stay (HOSPITAL_COMMUNITY)
Admission: EM | Admit: 2013-12-17 | Discharge: 2013-12-21 | DRG: 689 | Disposition: A | Discharge status: Skilled Nursing Facility | Payer: Medicare Other | Attending: Internal Medicine | Admitting: Internal Medicine

## 2013-12-17 ENCOUNTER — Emergency Department (HOSPITAL_COMMUNITY): Payer: Medicare Other

## 2013-12-17 DIAGNOSIS — F05 Delirium due to known physiological condition: Secondary | ICD-10-CM | POA: Diagnosis present

## 2013-12-17 DIAGNOSIS — G92 Toxic encephalopathy: Secondary | ICD-10-CM | POA: Diagnosis present

## 2013-12-17 DIAGNOSIS — R531 Weakness: Secondary | ICD-10-CM

## 2013-12-17 DIAGNOSIS — Z66 Do not resuscitate: Secondary | ICD-10-CM | POA: Diagnosis present

## 2013-12-17 DIAGNOSIS — F0390 Unspecified dementia without behavioral disturbance: Secondary | ICD-10-CM | POA: Diagnosis present

## 2013-12-17 DIAGNOSIS — R29898 Other symptoms and signs involving the musculoskeletal system: Secondary | ICD-10-CM | POA: Diagnosis present

## 2013-12-17 DIAGNOSIS — Z87440 Personal history of urinary (tract) infections: Secondary | ICD-10-CM

## 2013-12-17 DIAGNOSIS — J9819 Other pulmonary collapse: Secondary | ICD-10-CM | POA: Diagnosis present

## 2013-12-17 DIAGNOSIS — G929 Unspecified toxic encephalopathy: Secondary | ICD-10-CM | POA: Diagnosis present

## 2013-12-17 DIAGNOSIS — R4182 Altered mental status, unspecified: Secondary | ICD-10-CM

## 2013-12-17 DIAGNOSIS — H9190 Unspecified hearing loss, unspecified ear: Secondary | ICD-10-CM | POA: Diagnosis present

## 2013-12-17 DIAGNOSIS — R1310 Dysphagia, unspecified: Secondary | ICD-10-CM | POA: Diagnosis present

## 2013-12-17 DIAGNOSIS — N390 Urinary tract infection, site not specified: Principal | ICD-10-CM | POA: Diagnosis present

## 2013-12-17 DIAGNOSIS — I6350 Cerebral infarction due to unspecified occlusion or stenosis of unspecified cerebral artery: Secondary | ICD-10-CM | POA: Diagnosis present

## 2013-12-17 DIAGNOSIS — I639 Cerebral infarction, unspecified: Secondary | ICD-10-CM | POA: Insufficient documentation

## 2013-12-17 DIAGNOSIS — G459 Transient cerebral ischemic attack, unspecified: Secondary | ICD-10-CM

## 2013-12-17 LAB — PROTIME-INR
INR: 1.08 (ref 0.00–1.49)
PROTHROMBIN TIME: 14 s (ref 11.6–15.2)

## 2013-12-17 LAB — COMPREHENSIVE METABOLIC PANEL
ALT: 9 U/L (ref 0–53)
AST: 19 U/L (ref 0–37)
Albumin: 2.7 g/dL — ABNORMAL LOW (ref 3.5–5.2)
Alkaline Phosphatase: 79 U/L (ref 39–117)
Anion gap: 12 (ref 5–15)
BUN: 17 mg/dL (ref 6–23)
CALCIUM: 8.6 mg/dL (ref 8.4–10.5)
CO2: 24 mEq/L (ref 19–32)
Chloride: 101 mEq/L (ref 96–112)
Creatinine, Ser: 0.94 mg/dL (ref 0.50–1.35)
GFR calc non Af Amer: 71 mL/min — ABNORMAL LOW (ref >90)
GFR, EST AFRICAN AMERICAN: 83 mL/min — AB (ref >90)
Glucose, Bld: 138 mg/dL — ABNORMAL HIGH (ref 70–99)
Potassium: 4.3 mEq/L (ref 3.7–5.3)
SODIUM: 137 meq/L (ref 137–147)
TOTAL PROTEIN: 6.6 g/dL (ref 6.0–8.3)
Total Bilirubin: 0.3 mg/dL (ref 0.3–1.2)

## 2013-12-17 LAB — I-STAT TROPONIN, ED: Troponin i, poc: 0 ng/mL (ref 0.00–0.08)

## 2013-12-17 LAB — CBC
HCT: 32.4 % — ABNORMAL LOW (ref 39.0–52.0)
Hemoglobin: 10.1 g/dL — ABNORMAL LOW (ref 13.0–17.0)
MCH: 27.6 pg (ref 26.0–34.0)
MCHC: 31.2 g/dL (ref 30.0–36.0)
MCV: 88.5 fL (ref 78.0–100.0)
PLATELETS: 381 10*3/uL (ref 150–400)
RBC: 3.66 MIL/uL — ABNORMAL LOW (ref 4.22–5.81)
RDW: 14.8 % (ref 11.5–15.5)
WBC: 8.1 10*3/uL (ref 4.0–10.5)

## 2013-12-17 LAB — URINALYSIS, ROUTINE W REFLEX MICROSCOPIC
Bilirubin Urine: NEGATIVE
Glucose, UA: NEGATIVE mg/dL
Hgb urine dipstick: NEGATIVE
KETONES UR: NEGATIVE mg/dL
LEUKOCYTES UA: NEGATIVE
NITRITE: NEGATIVE
Protein, ur: NEGATIVE mg/dL
Specific Gravity, Urine: 1.015 (ref 1.005–1.030)
Urobilinogen, UA: 1 mg/dL (ref 0.0–1.0)
pH: 7 (ref 5.0–8.0)

## 2013-12-17 LAB — DIFFERENTIAL
Basophils Absolute: 0 10*3/uL (ref 0.0–0.1)
Basophils Relative: 1 % (ref 0–1)
EOS ABS: 0.3 10*3/uL (ref 0.0–0.7)
EOS PCT: 4 % (ref 0–5)
LYMPHS ABS: 1.6 10*3/uL (ref 0.7–4.0)
Lymphocytes Relative: 20 % (ref 12–46)
Monocytes Absolute: 0.9 10*3/uL (ref 0.1–1.0)
Monocytes Relative: 11 % (ref 3–12)
Neutro Abs: 5.3 10*3/uL (ref 1.7–7.7)
Neutrophils Relative %: 64 % (ref 43–77)

## 2013-12-17 LAB — CBG MONITORING, ED: GLUCOSE-CAPILLARY: 116 mg/dL — AB (ref 70–99)

## 2013-12-17 LAB — APTT: aPTT: 31 seconds (ref 24–37)

## 2013-12-17 MED ORDER — SENNOSIDES-DOCUSATE SODIUM 8.6-50 MG PO TABS: 1.0000 | ORAL_TABLET | Freq: Every evening | ORAL | Status: DC | PRN

## 2013-12-17 MED ORDER — STROKE: EARLY STAGES OF RECOVERY BOOK
Freq: Once | Status: AC
Start: 2013-12-17 — End: 2013-12-17
  Administered 2013-12-17: 18:00:00
  Filled 2013-12-17: qty 1

## 2013-12-17 MED ORDER — HEPARIN SODIUM (PORCINE) 5000 UNIT/ML IJ SOLN
5000.0000 [IU] | Freq: Three times a day (TID) | INTRAMUSCULAR | Status: DC
  Administered 2013-12-17 – 2013-12-20 (×10): 5000 [IU] via SUBCUTANEOUS
  Filled 2013-12-17 (×10): qty 1

## 2013-12-17 NOTE — ED Notes (Signed)
Pt to via EMS- pt family reports that he was eating breakfast this morning and was LSN at 0830. States that he attempted to get up and walk and was unable to. Pt also reports a headache at the base of his skull, and family reports the patient was leaning to the right after he was unable to get up and walk. Bp-137/71 Hr-70 Cbg-104

## 2013-12-17 NOTE — Consult Note (Signed)
Referring Physician: Ruel Favors  Chief Complaint: Slurred speech and right-sided weakness.  HPI: Terrance Adams is an 80 y.o. male history urinary tract infection, presenting with new onset slurred age in right-sided weakness. He was last well at 8 AM today. There is no previous history of stroke or TIA. He has been taking aspirin 81 mg per day. CT scan of his head was unremarkable. NIH stroke score was 7. Patient's deficits improved fairly rapidly.  LSN: 8 AM on 12/17/2013 tPA Given: No: Rapidly resolving deficits mRankin:  Past Medical History  Diagnosis Date  . UTI (urinary tract infection)     HX OF UTI    History reviewed. No pertinent family history.   Medications: I have reviewed the patient's current medications.  ROS: History obtained from the patient  General ROS: negative for - chills, fatigue, fever, night sweats, weight gain or weight loss Psychological ROS: negative for - behavioral disorder, hallucinations, memory difficulties, mood swings or suicidal ideation Ophthalmic ROS: negative for - blurry vision, double vision, eye pain or loss of vision ENT ROS: negative for - epistaxis, nasal discharge, oral lesions, sore throat, tinnitus or vertigo Allergy and Immunology ROS: negative for - hives or itchy/watery eyes Hematological and Lymphatic ROS: negative for - bleeding problems, bruising or swollen lymph nodes Endocrine ROS: negative for - galactorrhea, hair pattern changes, polydipsia/polyuria or temperature intolerance Respiratory ROS: negative for - cough, hemoptysis, shortness of breath or wheezing Cardiovascular ROS: negative for - chest pain, dyspnea on exertion, edema or irregular heartbeat Gastrointestinal ROS: negative for - abdominal pain, diarrhea, hematemesis, nausea/vomiting or stool incontinence Genito-Urinary ROS: negative for - dysuria, hematuria, incontinence or urinary frequency/urgency Musculoskeletal ROS: negative for - joint swelling or  muscular weakness Neurological ROS: as noted in HPI Dermatological ROS: negative for rash and skin lesion changes  Physical Examination: Blood pressure 134/60, pulse 73, temperature 97.5 F (36.4 C), temperature source Oral, resp. rate 13, weight 56.246 kg (124 lb), SpO2 99.00%.  Neurologic Examination: Mental Status: Alert, oriented, thought content appropriate.  Speech slightly slurred without evidence of aphasia. Able to follow commands without difficulty. Cranial Nerves: II-Visual fields were normal. III/IV/VI-Pupils were equal and reacted. Extraocular movements were full and conjugate.    V/VII-no facial numbness and no facial weakness. VIII-normal. X-slightly slurred slightly slurred speech. Motor: Moderately severe weakness of lower extremities proximally and distally  Sensory: Normal throughout. Deep Tendon Reflexes: 1+ and symmetric. Plantars: Flexor bilaterally Cerebellar: Normal finger-to-nose testing.  Ct Head (brain) Wo Contrast  12/17/2013   CLINICAL DATA:  Code stroke. Right-sided weakness, difficulty walking, and facial droop.  EXAM: CT HEAD WITHOUT CONTRAST  TECHNIQUE: Contiguous axial images were obtained from the base of the skull through the vertex without intravenous contrast.  COMPARISON:  10/08/2013  FINDINGS: There is no evidence of acute cortical infarct, intracranial hemorrhage, mass, midline shift, or extra-axial fluid collection. There is mild generalized cerebral atrophy, unchanged. Periventricular white matter hypodensities are unchanged and nonspecific but compatible with mild to moderate chronic small vessel ischemic disease.  Prior bilateral cataract extraction is noted. Chronic maxillary sinusitis is again seen with persistent, complete opacification of the sinus. Mild right frontal sinus and bilateral anterior ethmoid air cell mucosal thickening is again seen. Mastoid air cells are clear. Right mastoid sclerosis is again noted.  IMPRESSION: 1. No evidence of  acute intracranial abnormality. 2. Mild-to-moderate chronic small vessel ischemic disease and cerebral atrophy.   Electronically Signed   By: Sebastian Ache   On: 12/17/2013 12:12  Assessment: 80 y.o. male  presenting with possible TIA or small vessel acute cerebral infarction.   Stroke Risk Factors - none  Plan: 1. HgbA1c, fasting lipid panel 2. MRI, MRA  of the brain without contrast 3. PT consult, OT consult, Speech consult 4. Echocardiogram 5. Carotid dopplers 6. Prophylactic therapy-Antiplatelet med: Aspirin 7. Risk factor modification 8. Telemetry monitoring   C.R. Roseanne Reno, MD [facility]   12/17/2013, 1:26 PM

## 2013-12-17 NOTE — ED Notes (Signed)
Pt arrived via [facility] at this time; pt being taken to CT 3 with Dr. Roseanne Reno (Lucita Ferrara), Rubin Payor, MD and Lillia Abed, RN in tow

## 2013-12-17 NOTE — ED Notes (Signed)
Pt has returned from CT; pt undressed, in gown, on monitor, continuous pulse oximetry and blood pressure cuff; Dr. Roseanne Reno at bedside assessing pt at this time

## 2013-12-17 NOTE — ED Notes (Signed)
Code Stroke Called-1148 Pt arrival-1152 [id number] Neurologist [phone] Pt arrival in CT-1156 Lab arrival-1155 CT read-1200

## 2013-12-17 NOTE — ED Notes (Signed)
POC CBG resulted 116; Lillia Abed, RN aware

## 2013-12-17 NOTE — H&P (Signed)
[facility] History and Physical  TAVIOUS GRIESINGER ZOX:[id number] DOB: September 25, 1921 DOA: 12/17/2013  Referring physician: Dr. Fredderick Phenix PCP: Julian Hy, MD   Chief Complaint: weakness and slurred speech  HPI: Terrance Adams is a 80 y.o. male  Presents to the hospital complaining of one-day complaint of right-sided weakness and slurred speech. The problem occurred insidiously. Nothing the patient is aware of makes it better or worse. Patient was last normal this morning. He reports his symptoms have improved. Much of the history is obtained from brothers at bedside as patient is poor historian.  In the ED the patient was evaluated by neurologist who recommended medical admission for further evaluation and recommendations. CT scan of the head reported no evidence of acute intracranial abnormality  Review of Systems:  Patient is a poor historian and unable to appropriately obtain review of systems  Past Medical History  Diagnosis Date  . UTI (urinary tract infection)     HX OF UTI   Past Surgical History  Procedure Laterality Date  . Hernia repair     Social History:  reports that he has never smoked. He has never used smokeless tobacco. He reports that he does not drink alcohol or use illicit drugs.  No Known Allergies  History reviewed. No pertinent family history.   Prior to Admission medications   Medication Sig Start Date End Date Taking? Authorizing Provider  furosemide (LASIX) 20 MG tablet Take 20 mg by mouth daily.   Yes Historical Provider, MD  silver sulfADIAZINE (SILVADENE) 1 % cream Apply 1 application topically daily. 11/30/13  Yes Ovid Curd, DPM  traZODone (DESYREL) 50 MG tablet Take 50 mg by mouth at bedtime.   Yes Historical Provider, MD   Physical Exam: Filed Vitals:   12/17/13 1300 12/17/13 1324 12/17/13 1520 12/17/13 1615  BP: 134/60  121/63 128/62  Pulse: 73  68 70  Temp:  97.5 F (36.4 C)    TempSrc:      Resp: Weight:        SpO2: 99%  100% 99%    Wt Readings from Last 3 Encounters:  12/17/13 56.246 kg (124 lb)  11/17/13 56.291 kg (124 lb 1.6 oz)  10/12/13 62.37 kg (137 lb 8 oz)    General:  Appears calm and comfortable Eyes: PERRL, normal lids, irises & conjunctiva ENT: grossly normal hearing, lips & tongue, dry mucous membranes Neck: no LAD, masses or thyromegaly Cardiovascular: RRR, no m/r/g. No LE edema. Respiratory: CTA bilaterally, no w/r/r. Normal respiratory effort. Abdomen: soft, nt, nd Skin: no rash or induration seen on limited exam Musculoskeletal: No cyanosis or clubbing Psychiatric: Difficult to assess, mood and affect appropriate Neurologic: No facial asymmetry, slightly slurred speech           Labs on Admission:  Basic Metabolic Panel:  Recent Labs Lab 12/17/13 1155  NA 137  K 4.3  CL 101  CO2 24  GLUCOSE 138*  BUN 17  CREATININE 0.94  CALCIUM 8.6   Liver Function Tests:  Recent Labs Lab 12/17/13 1155  AST 19  ALT 9  ALKPHOS 79  BILITOT 0.3  PROT 6.6  ALBUMIN 2.7*   No results found for this basename: LIPASE, AMYLASE,  in the last 168 hours No results found for this basename: AMMONIA,  in the last 168 hours CBC:  Recent Labs Lab 12/17/13 1155  WBC 8.1  NEUTROABS 5.3  HGB 10.1*  HCT 32.4*  MCV 88.5  PLT 381  Cardiac Enzymes: No results found for this basename: CKTOTAL, CKMB, CKMBINDEX, TROPONINI,  in the last 168 hours  BNP (last 3 results) No results found for this basename: PROBNP,  in the last 8760 hours CBG:  Recent Labs Lab 12/17/13 1222  GLUCAP 116*    Radiological Exams on Admission: Ct Head (brain) Wo Contrast  12/17/2013   CLINICAL DATA:  Code stroke. Right-sided weakness, difficulty walking, and facial droop.  EXAM: CT HEAD WITHOUT CONTRAST  TECHNIQUE: Contiguous axial images were obtained from the base of the skull through the vertex without intravenous contrast.  COMPARISON:  10/08/2013  FINDINGS: There is no evidence of acute  cortical infarct, intracranial hemorrhage, mass, midline shift, or extra-axial fluid collection. There is mild generalized cerebral atrophy, unchanged. Periventricular white matter hypodensities are unchanged and nonspecific but compatible with mild to moderate chronic small vessel ischemic disease.  Prior bilateral cataract extraction is noted. Chronic maxillary sinusitis is again seen with persistent, complete opacification of the sinus. Mild right frontal sinus and bilateral anterior ethmoid air cell mucosal thickening is again seen. Mastoid air cells are clear. Right mastoid sclerosis is again noted.  IMPRESSION: 1. No evidence of acute intracranial abnormality. 2. Mild-to-moderate chronic small vessel ischemic disease and cerebral atrophy.   Electronically Signed   By: Sebastian Ache   On: 12/17/2013 12:12    EKG: Independently reviewed. Sinus rhythm with no ST elevations or depressions  Assessment/Plan Active Problems:   TIA (transient ischemic attack) - Patient will undergo TIA/stroke order set - Will defer management recommendations to neurology. They have been consulted by ED physician. - Disposition pending work results as well as physical therapy evaluation    Code Status: DNR DVT Prophylaxis: heparin Family Communication: d/c brothers at bedside Disposition Plan: Pending work up  Time spent: > 50 minutes  Penny Pia [facility] Pager [phone]

## 2013-12-17 NOTE — ED Provider Notes (Signed)
Spoke with physician on call for [facility] who says that the hospitalist service admits their stroke/TIA work-up patients.  Spoke with Dr. Cena Benton who will admit pt.  Rolan Bucco, MD 12/17/13 [phone]

## 2013-12-17 NOTE — ED Notes (Signed)
Stroke MD at the bedside.

## 2013-12-17 NOTE — ED Provider Notes (Signed)
CSN: [id number]     Arrival date & time 12/17/13  1152 History   First MD Initiated Contact with Patient 12/17/13 1216     Chief Complaint  Patient presents with  . Code Stroke   Level V caveat due 2 slurred speech  (Consider location/radiation/quality/duration/timing/severity/associated sxs/prior Treatment) The history is provided by the patient.   patient with slurred speech and right-sided weakness. Last normal this morning. Came in as a code stroke. Symptoms have improved somewhat. Complete NIH scoring done by neurology. he has had a dull headache. No fevers. Somewhat difficult to get a history from.  Past Medical History  Diagnosis Date  . UTI (urinary tract infection)     HX OF UTI   Past Surgical History  Procedure Laterality Date  . Hernia repair     History reviewed. No pertinent family history. History  Substance Use Topics  . Smoking status: Never Smoker   . Smokeless tobacco: Never Used  . Alcohol Use: No    Review of Systems  Unable to perform ROS Constitutional: Negative for chills.  Eyes: Negative for visual disturbance.  Cardiovascular: Negative for chest pain.  Gastrointestinal: Negative for abdominal pain.  Musculoskeletal: Negative for back pain.  Skin: Negative for wound.  Neurological: Positive for speech difficulty and weakness.      Allergies  Review of patient's allergies indicates no known allergies.  Home Medications   Prior to Admission medications   Medication Sig Start Date End Date Taking? Authorizing Provider  furosemide (LASIX) 20 MG tablet Take 20 mg by mouth daily.   Yes Historical Provider, MD  silver sulfADIAZINE (SILVADENE) 1 % cream Apply 1 application topically daily. 11/30/13  Yes Ovid Curd, DPM  traZODone (DESYREL) 50 MG tablet Take 50 mg by mouth at bedtime.   Yes Historical Provider, MD   BP 121/63  Pulse 68  Temp(Src) 97.5 F (36.4 C) (Oral)  Resp 14  Wt 124 lb (56.246 kg)  SpO2 100% Physical Exam   Constitutional: He appears well-developed and well-nourished.  HENT:  Head: Normocephalic and atraumatic.  Eyes: Pupils are equal, round, and reactive to light.  Cardiovascular: Normal rate.   Pulmonary/Chest: Effort normal and breath sounds normal.  Abdominal: Soft. There is no tenderness.  Musculoskeletal: Normal range of motion.  Neurological:  Slurred speech is somewhat difficult of hearing also. Complete NIH scoring done by neurology.  Skin: Skin is warm.    ED Course  Procedures (including critical care time) Labs Review Labs Reviewed  CBC - Abnormal; Notable for the following:    RBC 3.66 (*)    Hemoglobin 10.1 (*)    HCT 32.4 (*)    All other components within normal limits  COMPREHENSIVE METABOLIC PANEL - Abnormal; Notable for the following:    Glucose, Bld 138 (*)    Albumin 2.7 (*)    GFR calc non Af Amer 71 (*)    GFR calc Af Amer 83 (*)    All other components within normal limits  CBG MONITORING, ED - Abnormal; Notable for the following:    Glucose-Capillary 116 (*)    All other components within normal limits  PROTIME-INR  APTT  DIFFERENTIAL  URINALYSIS, ROUTINE W REFLEX MICROSCOPIC  I-STAT TROPOININ, ED    Imaging Review Ct Head (brain) Wo Contrast  12/17/2013   CLINICAL DATA:  Code stroke. Right-sided weakness, difficulty walking, and facial droop.  EXAM: CT HEAD WITHOUT CONTRAST  TECHNIQUE: Contiguous axial images were obtained from the base of the skull through  the vertex without intravenous contrast.  COMPARISON:  10/08/2013  FINDINGS: There is no evidence of acute cortical infarct, intracranial hemorrhage, mass, midline shift, or extra-axial fluid collection. There is mild generalized cerebral atrophy, unchanged. Periventricular white matter hypodensities are unchanged and nonspecific but compatible with mild to moderate chronic small vessel ischemic disease.  Prior bilateral cataract extraction is noted. Chronic maxillary sinusitis is again seen with  persistent, complete opacification of the sinus. Mild right frontal sinus and bilateral anterior ethmoid air cell mucosal thickening is again seen. Mastoid air cells are clear. Right mastoid sclerosis is again noted.  IMPRESSION: 1. No evidence of acute intracranial abnormality. 2. Mild-to-moderate chronic small vessel ischemic disease and cerebral atrophy.   Electronically Signed   By: Sebastian Ache   On: 12/17/2013 12:12     EKG Interpretation None      MDM   Final diagnoses:  Transient cerebral ischemia, unspecified transient cerebral ischemia type  Altered mental status, unspecified altered mental status type    Patient with altered mental status and slurred speech. Seen by neurology. May be stroke/TIA. Will admit to internal medicine. Patient had been a patient of Dr Evlyn Kanner in the [facility] and saw DR [place] Day as a new patient the end of this week.     Juliet Rude. Rubin Payor, MD 12/17/13 [phone]

## 2013-12-18 ENCOUNTER — Inpatient Hospital Stay (HOSPITAL_COMMUNITY): Payer: Medicare Other

## 2013-12-18 LAB — TROPONIN I: Troponin I: <0.3 ng/mL (ref <0.30)

## 2013-12-18 LAB — BASIC METABOLIC PANEL
ANION GAP: 10 (ref 5–15)
BUN: 12 mg/dL (ref 6–23)
CO2: 27 mEq/L (ref 19–32)
CREATININE: 0.67 mg/dL (ref 0.50–1.35)
Calcium: 8.3 mg/dL — ABNORMAL LOW (ref 8.4–10.5)
Chloride: 103 mEq/L (ref 96–112)
GFR calc Af Amer: >90 mL/min (ref >90)
GFR calc non Af Amer: 82 mL/min — ABNORMAL LOW (ref >90)
Glucose, Bld: 97 mg/dL (ref 70–99)
POTASSIUM: 3.8 meq/L (ref 3.7–5.3)
Sodium: 140 mEq/L (ref 137–147)

## 2013-12-18 LAB — LIPID PANEL
Cholesterol: 140 mg/dL (ref 0–200)
HDL: 68 mg/dL (ref >39)
LDL CALC: 61 mg/dL (ref 0–99)
Total CHOL/HDL Ratio: 2.1 RATIO
Triglycerides: 56 mg/dL (ref <150)
VLDL: 11 mg/dL (ref 0–40)

## 2013-12-18 LAB — HEMOGLOBIN A1C
HEMOGLOBIN A1C: 6.4 % — AB (ref <5.7)
Mean Plasma Glucose: 137 mg/dL — ABNORMAL HIGH (ref <117)

## 2013-12-18 LAB — VITAMIN B12: Vitamin B-12: 315 pg/mL (ref 211–911)

## 2013-12-18 LAB — TSH: TSH: 1.59 u[IU]/mL (ref 0.350–4.500)

## 2013-12-18 LAB — AMMONIA: Ammonia: 14 umol/L (ref 11–60)

## 2013-12-18 MED ORDER — ASPIRIN 325 MG PO TABS
325.0000 mg | ORAL_TABLET | Freq: Every day | ORAL | Status: DC
  Administered 2013-12-18 – 2013-12-21 (×4): 325 mg via ORAL
  Filled 2013-12-18 (×4): qty 1

## 2013-12-18 MED ORDER — SODIUM CHLORIDE 0.9 % IV SOLN
INTRAVENOUS | Status: DC
  Administered 2013-12-18 – 2013-12-19 (×2): via INTRAVENOUS

## 2013-12-18 MED ORDER — TRAZODONE HCL 50 MG PO TABS
50.0000 mg | ORAL_TABLET | Freq: Every day | ORAL | Status: DC
  Administered 2013-12-18: 50 mg via ORAL
  Filled 2013-12-18: qty 1

## 2013-12-18 MED ORDER — INFLUENZA VAC SPLIT QUAD 0.5 ML IM SUSY
0.5000 mL | PREFILLED_SYRINGE | INTRAMUSCULAR | Status: AC
  Administered 2013-12-19: 0.5 mL via INTRAMUSCULAR
  Filled 2013-12-18: qty 0.5

## 2013-12-18 MED ORDER — PNEUMOCOCCAL VAC POLYVALENT 25 MCG/0.5ML IJ INJ
0.5000 mL | INJECTION | INTRAMUSCULAR | Status: AC
  Administered 2013-12-19: 0.5 mL via INTRAMUSCULAR
  Filled 2013-12-18: qty 0.5

## 2013-12-18 NOTE — Evaluation (Signed)
Clinical/Bedside Swallow Evaluation Patient Details  Name: Terrance Adams MRN: [id number] Date of Birth: Jun 09, 1921  Today's Date: 12/18/2013 Time: 1027-2536 SLP Time Calculation (min): 30 min  Past Medical History:  Past Medical History  Diagnosis Date  . UTI (urinary tract infection)     HX OF UTI   Past Surgical History:  Past Surgical History  Procedure Laterality Date  . Hernia repair     HPI:      Assessment / Plan / Recommendation Clinical Impression  Patient presents with a mild-moderate oropharyngeal dysphagia with impact of current lethargy. Patient's dysphagia characterized by decreased awareness of bolus until presented at lips with liquids, suspected delayed swallow initiation with thin liquids and nectar thick liquids, immediate cough and delayed throat clearing and mild wet vocal quality with thin liquids.    Aspiration Risk  Moderate    Diet Recommendation Dysphagia 1 (Puree);Nectar-thick liquid   Liquid Administration via: Cup;No straw Medication Administration: Crushed with puree Supervision: Full supervision/cueing for compensatory strategies Compensations: Slow rate;Small sips/bites;Check for pocketing Postural Changes and/or Swallow Maneuvers: Seated upright 90 degrees    Other  Recommendations Oral Care Recommendations: Oral care BID Other Recommendations: Order thickener from pharmacy   Follow Up Recommendations  Skilled Nursing facility;24 hour supervision/assistance    Frequency and Duration min 2x/week  2 weeks   Pertinent Vitals/Pain     SLP Swallow Goals  1. Patient will tolerate Dysphagia 1, Nectar thick liquids diet with no overt s/s aspiration.  2. Patient will tolerate trials of upgraded solids and liquids as able.   Swallow Study Prior Functional Status       General Date of Onset: 12/17/13 Type of Study: Bedside swallow evaluation Previous Swallow Assessment: NA Diet Prior to this Study: NPO Temperature Spikes Noted:  No Respiratory Status: Room air History of Recent Intubation: No Behavior/Cognition: Alert;Cooperative;Pleasant mood;Confused;Lethargic;Hard of hearing Oral Cavity - Dentition: Adequate natural dentition Self-Feeding Abilities: Total assist Patient Positioning: Upright in bed Baseline Vocal Quality: Clear;Low vocal intensity Volitional Cough: Weak Volitional Swallow: Able to elicit    Oral/Motor/Sensory Function Overall Oral Motor/Sensory Function: Impaired Labial Symmetry: Within Functional Limits Labial Strength: Reduced Lingual ROM: Within Functional Limits Lingual Symmetry: Within Functional Limits Lingual Strength: Reduced Facial ROM: Within Functional Limits Facial Symmetry: Within Functional Limits Facial Strength: Reduced Velum: Within Functional Limits Mandible: Within Functional Limits   Ice Chips Ice chips: Not tested   Thin Liquid Thin Liquid: Impaired Presentation: Cup Oral Phase Impairments: Reduced labial seal;Poor awareness of bolus;Reduced lingual movement/coordination Pharyngeal  Phase Impairments: Suspected delayed Swallow;Cough - Immediate;Throat Clearing - Delayed;Wet Vocal Quality    Nectar Thick Nectar Thick Liquid: Impaired Presentation: Cup Oral Phase Impairments: Reduced labial seal;Reduced lingual movement/coordination Other Comments: No overt s/s aspiration with nectar thick liquids   Honey Thick Honey Thick Liquid: Not tested   Puree Puree: Within functional limits   Solid   GO            Terrance Adams Terrance Adams 12/18/2013,1:30 PM    Angela Nevin, MA, CCC-SLP [facility] Speech-Language Pathologist

## 2013-12-18 NOTE — Progress Notes (Signed)
Patient arrives to unit via stretcher from ER. Son is present at bedside.  Oriented both to room and floor.  Patient placed on telemetry unit #16; pt. In NSR.  Patient given the call light and instructed on its use as well as informed him of use of bed alarm for safety. Requested patient to use call light to call for assistance for toileting. Bed placed in lowest position and alarm armed. Patient resting quietly at this time with even respirations. Will continue to monitor.

## 2013-12-18 NOTE — Progress Notes (Signed)
STROKE TEAM PROGRESS NOTE   HISTORY Terrance Adams is an 80 y.o. male history urinary tract infection, presenting with new onset slurred age in right-sided weakness. He was last well at 8 AM today. There is no previous history of stroke or TIA. He has been taking aspirin 81 mg per day. CT scan of his head was unremarkable. NIH stroke score was 7. Patient's deficits improved fairly rapidly.   LSN: 8 AM on 12/17/2013  tPA Given: No: Rapidly resolving deficits  mRankin:   SUBJECTIVE (INTERVAL HISTORY) The patient's nephew is at the bedside. He denies slurred speech but rather says patient has been more confused, waxing and waning worse in the evening. The patient does have mild baseline dementia although the patient's nephew reports that his symptoms have been worse over the past week. The patient did have a recent urinary tract infection which has been recurring. He is very hard of hearing. He has not been sleeping well. All of this may be contributing to the patient's symptoms. No infarct was noted on the patient's MRI.   OBJECTIVE Temp:  [97.4 F (36.3 C)-99.7 F (37.6 C)] 98.4 F (36.9 C) (09/20 1054) Pulse Rate:  [66-83] 78 (09/20 1054) Cardiac Rhythm:  [-] Normal sinus rhythm (09/19 2000) Resp:  [12-20] 20 (09/20 1054) BP: (108-155)/(50-73) 114/73 mmHg (09/20 1054) SpO2:  [91 %-100 %] 91 % (09/20 1054) Weight:  [124 lb (56.246 kg)] 124 lb (56.246 kg) (09/19 1212)   Recent Labs Lab 12/17/13 1222  GLUCAP 116*    Recent Labs Lab 12/17/13 1155  NA 137  K 4.3  CL 101  CO2 24  GLUCOSE 138*  BUN 17  CREATININE 0.94  CALCIUM 8.6    Recent Labs Lab 12/17/13 1155  AST 19  ALT 9  ALKPHOS 79  BILITOT 0.3  PROT 6.6  ALBUMIN 2.7*    Recent Labs Lab 12/17/13 1155  WBC 8.1  NEUTROABS 5.3  HGB 10.1*  HCT 32.4*  MCV 88.5  PLT 381   No results found for this basename: CKTOTAL, CKMB, CKMBINDEX, TROPONINI,  in the last 168 hours  Recent Labs  12/17/13 1155   LABPROT 14.0  INR 1.08    Recent Labs  12/17/13 1414  COLORURINE YELLOW  LABSPEC 1.015  PHURINE 7.0  GLUCOSEU NEGATIVE  HGBUR NEGATIVE  BILIRUBINUR NEGATIVE  KETONESUR NEGATIVE  PROTEINUR NEGATIVE  UROBILINOGEN 1.0  NITRITE NEGATIVE  LEUKOCYTESUR NEGATIVE       Component Value Date/Time   CHOL 140 12/18/2013 0421   TRIG 56 12/18/2013 0421   HDL 68 12/18/2013 0421   CHOLHDL 2.1 12/18/2013 0421   VLDL 11 12/18/2013 0421   LDLCALC 61 12/18/2013 0421   No results found for this basename: HGBA1C      Component Value Date/Time   LABOPIA NONE DETECTED 03/28/2011 1140   COCAINSCRNUR NONE DETECTED 03/28/2011 1140   LABBENZ NONE DETECTED 03/28/2011 1140   AMPHETMU NONE DETECTED 03/28/2011 1140   THCU NONE DETECTED 03/28/2011 1140   LABBARB NONE DETECTED 03/28/2011 1140    No results found for this basename: ETH,  in the last 168 hours  Dg Chest 1 View 12/18/2013    Suboptimal inspiration accounts for bibasilar atelectasis, superimposed upon COPD/emphysema. No acute cardiopulmonary disease otherwise. Stable chronic elevation of the left hemidiaphragm.       Ct Head (brain) Wo Contrast 12/17/2013    1. No evidence of acute intracranial abnormality.  2. Mild-to-moderate chronic small vessel ischemic disease and cerebral atrophy.  MRI / MRA Brain Wo Contrast 12/18/2013    Motion degraded exam.  No acute infarction seen.  Ordinary chronic small-vessel ischemic changes throughout the brain, typical for a person of this age.  No major vessel occlusion or correctable proximal stenosis.      PHYSICAL EXAM  NEUROLOGIC:   MENTAL STATUS: awake, alert, mildly confused. He attempts to follow most commands. CRANIAL NERVES: pupils equal and reactive to light,extraocular muscles intact, Does not blink to threat on the right, facial  strength symmetric, uvula midlinec, tongue midline MOTOR: normal bulk and tone, Strength -he is able to move all extremities against gravity. There is a  question of some mild right upper extremity weakness. REFLEXES: Brisk for age           ASSESSMENT/PLAN  Mr. Terrance Adams is a 80 y.o. male with history of UTI presenting with slurred speech and right hemiparesis by report. However family at bedside say he has dementia and has been more confused the last week in the setting of a UTI. Nephew denies any speech slurring but endorses that patient has been having difficulty recognizing family and his confusion is worse in the evenings. He also has not been sleeping well.  He did not receive IV t-PA due to rapidly resolving deficits. Imaging confirms no acute infarct. TIA work up underway.  Possible TIA:  Unknown etiology - possibly small vessel disease.   no antithrombotics prior to admission, now on aspirin 325 mg orally every day  MRI  no acute infarct  MRA  ordinary chronic small-vessel ischemic changes  Carotid Doppler  pending  2D Echo  pending  LDL 61 no statin necessary as meeting goal of LDL <100. No lipid lowering agents prior to admission.  HgbA1c pending  Subcutaneous heparin fo  Resultant mild confusion  Other Pertinent History  Recent UTI  Confusion may be metabolic/toxic encephalopathy rather than acute stroke. Suggest infectious and metabolic workup.   Hospital day # 1  Delton See PA-C [facility] Pager [phone] 12/18/2013, 12:36 PM  Personally examined patient and images, agree with history, physical, neuro exam as stated above. Agree with assessment and plan.   Naomie Dean, MD [facility]      To contact Stroke Continuity provider, please refer to [email or web address]. After hours, contact General Neurology

## 2013-12-18 NOTE — Evaluation (Signed)
Physical Therapy Evaluation Patient Details Name: Terrance Adams MRN: [id number] DOB: Jul 25, 1921 Today's Date: 12/18/2013   History of Present Illness  pt admitted with a 1 day h/o R sided weakness and slurred speech.  MRI negative for stroke.  Clinical Impression  Pt admitted with/for s/s of stroke with R UE weakness.  MRI negative for acute event.  Pt currently limited functionally due to the problems listed below.  (see problems list.)  Pt will benefit from PT to maximize function and safety to be able to get home safely with available assist of family.     Follow Up Recommendations Home health PT;Supervision for mobility/OOB    Equipment Recommendations  None recommended by PT    Recommendations for Other Services       Precautions / Restrictions Precautions Precautions: Fall Restrictions Weight Bearing Restrictions: No      Mobility  Bed Mobility Overal bed mobility: Needs Assistance Bed Mobility: Supine to Sit     Supine to sit: Min assist     General bed mobility comments: min to come forward and scoot to EOB; cues for sequencing  Transfers Overall transfer level: Needs assistance Equipment used: Rolling walker (2 wheeled) Transfers: Sit to/from Stand Sit to Stand: Min assist         General transfer comment: cues for hand placement and assist to translate weight over BOS  Ambulation/Gait Ambulation/Gait assistance: Min assist Ambulation Distance (Feet): 140 Feet Assistive device: Rolling walker (2 wheeled) Gait Pattern/deviations: Step-through pattern;Decreased step length - right;Decreased step length - left;Decreased stride length;Trunk flexed Gait velocity: slow and guarded   General Gait Details: Slow and guarded gait with generally good use of the RW  Stairs Stairs: Yes          Wheelchair Mobility    Modified Rankin (Stroke Patients Only)       Balance Overall balance assessment: Needs assistance Sitting-balance support: No  upper extremity supported Sitting balance-Leahy Scale: Fair     Standing balance support: No upper extremity supported Standing balance-Leahy Scale: Poor Standing balance comment: posterior lean                             Pertinent Vitals/Pain Pain Assessment: No/denies pain    Home Living Family/patient expects to be discharged to:: Private residence Living Arrangements: Alone Available Help at Discharge: Family;Available 24 hours/day (have been taking turns caring for pt.) Type of Home: House Home Access: Ramped entrance     Home Layout: One level Home Equipment: Walker - 2 wheels Additional Comments: Up until 3 weeks ago was in ?[facility] post fall.  Pt's family has been with him pretty much 24/7    Prior Function Level of Independence: Independent;Needs assistance   Gait / Transfers Assistance Needed: No assistive device for ambulation.  Was having increased falls.  ADL's / Homemaking Assistance Needed: Brothers provided meals.        Hand Dominance        Extremity/Trunk Assessment   Upper Extremity Assessment: Defer to OT evaluation (R UE still weaker than L;weak grip)           Lower Extremity Assessment: Overall WFL for tasks assessed;Generalized weakness         Communication   Communication: HOH;Expressive difficulties  Cognition Arousal/Alertness: Awake/alert Behavior During Therapy: WFL for tasks assessed/performed Overall Cognitive Status: Within Functional Limits for tasks assessed Area of Impairment: Memory  General Comments: got his time line and sequence of events off.    General Comments      Exercises        Assessment/Plan    PT Assessment Patient needs continued PT services  PT Diagnosis Difficulty walking;Generalized weakness   PT Problem List Decreased strength;Decreased balance;Decreased mobility;Decreased coordination;Decreased knowledge of use of DME  PT Treatment Interventions DME  instruction;Gait training;Functional mobility training;Therapeutic activities;Stair training;Patient/family education   PT Goals (Current goals can be found in the Care Plan section) Acute Rehab PT Goals Patient Stated Goal: back home PT Goal Formulation: With patient Time For Goal Achievement: 01/01/14 Potential to Achieve Goals: Good    Frequency Min 3X/week   Barriers to discharge        Co-evaluation               End of Session   Activity Tolerance: Patient tolerated treatment well Patient left: in chair;with call bell/phone within reach;with family/visitor present Nurse Communication: Mobility status         Time: 1610-9604 PT Time Calculation (min): 32 min   Charges:   PT Evaluation $Initial PT Evaluation Tier I: 1 Procedure PT Treatments $Gait Training: 8-22 mins $Therapeutic Activity: 8-22 mins   PT G Codes:          Tylicia Sherman, Eliseo Gum 12/18/2013, 4:12 PM 12/18/2013  Graham Bing, PT [phone]  (pager)

## 2013-12-18 NOTE — Progress Notes (Signed)
[facility] PROGRESS NOTE  Terrance Adams WUJ:[id number] DOB: 09/07/1921 DOA: 12/17/2013 PCP: Julian Hy, MD  Assessment/Plan: 1-Encephalopathy: - MRI; No acute infarction seen.  -Will do work up for infection: Urine culture, Chest x ray: bibasilar atelectasis.  -Will also order B-12- (last B-12 was at 122 on July). And Thiamine.  -Cycle troponin.  -Check TSH, ammonia 14.   2-Right side weakness, slurred  Speech: MRI;  No acute infarction seen. ECHO; Doppler Pending.  Will start aspirin. Hb A1c 6.4. LDL 61.  Neurology following.   Code Status: DNR. Family Communication: Care discussed with brothers.  Disposition Plan: To be determine, might need placement.    Consultants:  Neurology.   Procedures:  ECHO; Pending.   Doppler: Pending.   Antibiotics:  none  HPI/Subjective: Patient lethargic, open eyes, say few words, follow some command.   Objective: Filed Vitals:   12/18/13 1054  BP: 114/73  Pulse: 78  Temp: 98.4 F (36.9 C)  Resp: 20   No intake or output data in the 24 hours ending 12/18/13 1120 Filed Weights   12/17/13 1212  Weight: 56.246 kg (124 lb)    Exam:   General:  No distress. Lethargic.   Cardiovascular: S 1, S 2 RRR  Respiratory: Ron[place].   Abdomen: Bs present, soft.   Musculoskeletal: no edema.   Data Reviewed: Basic Metabolic Panel:  Recent Labs Lab 12/17/13 1155  NA 137  K 4.3  CL 101  CO2 24  GLUCOSE 138*  BUN 17  CREATININE 0.94  CALCIUM 8.6   Liver Function Tests:  Recent Labs Lab 12/17/13 1155  AST 19  ALT 9  ALKPHOS 79  BILITOT 0.3  PROT 6.6  ALBUMIN 2.7*   No results found for this basename: LIPASE, AMYLASE,  in the last 168 hours No results found for this basename: AMMONIA,  in the last 168 hours CBC:  Recent Labs Lab 12/17/13 1155  WBC 8.1  NEUTROABS 5.3  HGB 10.1*  HCT 32.4*  MCV 88.5  PLT 381   Cardiac Enzymes: No results found for this basename: CKTOTAL, CKMB, CKMBINDEX,  TROPONINI,  in the last 168 hours BNP (last 3 results) No results found for this basename: PROBNP,  in the last 8760 hours CBG:  Recent Labs Lab 12/17/13 1222  GLUCAP 116*    No results found for this or any previous visit (from the past 240 hour(s)).   Studies: Dg Chest 1 View  12/18/2013   CLINICAL DATA:  Stroke.  EXAM: CHEST - 1 VIEW  COMPARISON:  Two-view chest x-ray 10/08/2013, 03/27/2011. CT chest 01/08/2010.  FINDINGS: Suboptimal inspiration accounts for crowded bronchovascular markings diffusely and atelectasis in the bases, and accentuates the cardiac silhouette. Stable chronic elevation of the left hemidiaphragm. Cardiac silhouette upper normal in size for AP technique. Thoracic aorta tortuous and atherosclerotic, unchanged. Emphysematous changes in the upper lobes. Lungs otherwise clear without confluent airspace consolidation. No visible pleural effusions.  IMPRESSION: Suboptimal inspiration accounts for bibasilar atelectasis, superimposed upon COPD/emphysema. No acute cardiopulmonary disease otherwise. Stable chronic elevation of the left hemidiaphragm.   Electronically Signed   By: Hulan Saas M.D.   On: 12/18/2013 09:31   Ct Head (brain) Wo Contrast  12/17/2013   CLINICAL DATA:  Code stroke. Right-sided weakness, difficulty walking, and facial droop.  EXAM: CT HEAD WITHOUT CONTRAST  TECHNIQUE: Contiguous axial images were obtained from the base of the skull through the vertex without intravenous contrast.  COMPARISON:  10/08/2013  FINDINGS: There is no evidence  of acute cortical infarct, intracranial hemorrhage, mass, midline shift, or extra-axial fluid collection. There is mild generalized cerebral atrophy, unchanged. Periventricular white matter hypodensities are unchanged and nonspecific but compatible with mild to moderate chronic small vessel ischemic disease.  Prior bilateral cataract extraction is noted. Chronic maxillary sinusitis is again seen with persistent, complete  opacification of the sinus. Mild right frontal sinus and bilateral anterior ethmoid air cell mucosal thickening is again seen. Mastoid air cells are clear. Right mastoid sclerosis is again noted.  IMPRESSION: 1. No evidence of acute intracranial abnormality. 2. Mild-to-moderate chronic small vessel ischemic disease and cerebral atrophy.   Electronically Signed   By: Sebastian Ache   On: 12/17/2013 12:12   Mr Brain Wo Contrast  12/18/2013   CLINICAL DATA:  One day history of right-sided weakness and slurred speech. Confusion. Unable to hold still.  EXAM: MRI HEAD WITHOUT CONTRAST  MRA HEAD WITHOUT CONTRAST  TECHNIQUE: Multiplanar, multiecho pulse sequences of the brain and surrounding structures were obtained without intravenous contrast. Angiographic images of the head were obtained using MRA technique without contrast.  COMPARISON:  Head CT 12/17/2013.  FINDINGS: MRI HEAD FINDINGS  Images are degraded by motion.  Diffusion imaging does not show any acute or subacute infarction. The brainstem is normal. There are a few old small vessel cerebellar infarctions. The cerebral hemispheres show old small vessel infarctions within the thalami basal ganglia and chronic small vessel changes throughout the white matter. No cortical or large vessel territory infarction. No mass lesion, hemorrhage, hydrocephalus or extra-axial collection. There is opacification of the right maxillary sinus  MRA HEAD FINDINGS  Both internal carotid arteries are widely patent into the brain. The anterior and middle cerebral vessels are patent without proximal stenosis, aneurysm or vascular malformation. A1 segment on the right is diminutive.  Both vertebral arteries are patent. There is 50% stenosis of the right vertebral artery at the foramen magnum. No basilar stenosis. Posterior circulation branch vessels appear normal.  IMPRESSION: Motion degraded exam.  No acute infarction seen.  Ordinary chronic small-vessel ischemic changes throughout the  brain, typical for a person of this age.  No major vessel occlusion or correctable proximal stenosis.   Electronically Signed   By: Paulina Fusi M.D.   On: 12/18/2013 09:31   Mr Maxine Glenn Head/brain Wo Cm  12/18/2013   CLINICAL DATA:  One day history of right-sided weakness and slurred speech. Confusion. Unable to hold still.  EXAM: MRI HEAD WITHOUT CONTRAST  MRA HEAD WITHOUT CONTRAST  TECHNIQUE: Multiplanar, multiecho pulse sequences of the brain and surrounding structures were obtained without intravenous contrast. Angiographic images of the head were obtained using MRA technique without contrast.  COMPARISON:  Head CT 12/17/2013.  FINDINGS: MRI HEAD FINDINGS  Images are degraded by motion.  Diffusion imaging does not show any acute or subacute infarction. The brainstem is normal. There are a few old small vessel cerebellar infarctions. The cerebral hemispheres show old small vessel infarctions within the thalami basal ganglia and chronic small vessel changes throughout the white matter. No cortical or large vessel territory infarction. No mass lesion, hemorrhage, hydrocephalus or extra-axial collection. There is opacification of the right maxillary sinus  MRA HEAD FINDINGS  Both internal carotid arteries are widely patent into the brain. The anterior and middle cerebral vessels are patent without proximal stenosis, aneurysm or vascular malformation. A1 segment on the right is diminutive.  Both vertebral arteries are patent. There is 50% stenosis of the right vertebral artery at the foramen magnum.  No basilar stenosis. Posterior circulation branch vessels appear normal.  IMPRESSION: Motion degraded exam.  No acute infarction seen.  Ordinary chronic small-vessel ischemic changes throughout the brain, typical for a person of this age.  No major vessel occlusion or correctable proximal stenosis.   Electronically Signed   By: Paulina Fusi M.D.   On: 12/18/2013 09:31    Scheduled Meds: . aspirin  325 mg Oral Daily  .  heparin  5,000 Units Subcutaneous 3 times per day   Continuous Infusions:   Active Problems:   TIA (transient ischemic attack)    Time spent: 35 minutes.     Hartley Barefoot A  [facility] Pager [phone]. If 7PM-7AM, please contact night-coverage at [email or web address], password [email or web address] 12/18/2013, 11:20 AM  LOS: 1 day

## 2013-12-19 ENCOUNTER — Ambulatory Visit: Payer: Self-pay | Admitting: Cardiology

## 2013-12-19 DIAGNOSIS — R5383 Other fatigue: Secondary | ICD-10-CM

## 2013-12-19 DIAGNOSIS — F05 Delirium due to known physiological condition: Secondary | ICD-10-CM

## 2013-12-19 DIAGNOSIS — G459 Transient cerebral ischemic attack, unspecified: Secondary | ICD-10-CM

## 2013-12-19 DIAGNOSIS — R5381 Other malaise: Secondary | ICD-10-CM

## 2013-12-19 DIAGNOSIS — N390 Urinary tract infection, site not specified: Principal | ICD-10-CM

## 2013-12-19 LAB — CBC
HCT: 30 % — ABNORMAL LOW (ref 39.0–52.0)
Hemoglobin: 9.7 g/dL — ABNORMAL LOW (ref 13.0–17.0)
MCH: 27.8 pg (ref 26.0–34.0)
MCHC: 32.3 g/dL (ref 30.0–36.0)
MCV: 86 fL (ref 78.0–100.0)
Platelets: 329 10*3/uL (ref 150–400)
RBC: 3.49 MIL/uL — ABNORMAL LOW (ref 4.22–5.81)
RDW: 14.7 % (ref 11.5–15.5)
WBC: 8.4 10*3/uL (ref 4.0–10.5)

## 2013-12-19 LAB — BASIC METABOLIC PANEL
Anion gap: 12 (ref 5–15)
BUN: 12 mg/dL (ref 6–23)
CO2: 22 mEq/L (ref 19–32)
Calcium: 7.9 mg/dL — ABNORMAL LOW (ref 8.4–10.5)
Chloride: 108 mEq/L (ref 96–112)
Creatinine, Ser: 0.68 mg/dL (ref 0.50–1.35)
GFR calc Af Amer: >90 mL/min (ref >90)
GFR calc non Af Amer: 82 mL/min — ABNORMAL LOW (ref >90)
Glucose, Bld: 93 mg/dL (ref 70–99)
Potassium: 4 mEq/L (ref 3.7–5.3)
Sodium: 142 mEq/L (ref 137–147)

## 2013-12-19 LAB — TROPONIN I

## 2013-12-19 MED ORDER — CYANOCOBALAMIN 1000 MCG/ML IJ SOLN
100.0000 ug | Freq: Every day | INTRAMUSCULAR | Status: DC
  Administered 2013-12-19 – 2013-12-20 (×2): 100 ug via INTRAMUSCULAR
  Filled 2013-12-19 (×3): qty 0.1

## 2013-12-19 MED ORDER — ACETAMINOPHEN 325 MG PO TABS
650.0000 mg | ORAL_TABLET | ORAL | Status: DC | PRN
  Administered 2013-12-19 – 2013-12-20 (×2): 650 mg via ORAL
  Filled 2013-12-19 (×2): qty 2

## 2013-12-19 MED ORDER — DEXTROSE 5 % IV SOLN
1.0000 g | INTRAVENOUS | Status: DC
  Administered 2013-12-19 – 2013-12-20 (×2): 1 g via INTRAVENOUS
  Filled 2013-12-19 (×3): qty 10

## 2013-12-19 NOTE — Evaluation (Signed)
Occupational Therapy Evaluation Patient Details Name: Terrance Adams MRN: [id number] DOB: [birth year] Today's Date: 12/19/2013    History of Present Illness pt admitted with a 1 day h/o R sided weakness and slurred speech.  MRI negative for stroke.   Clinical Impression   Pt admitted with above. Do not feel pt is safe to d/c home based on performance during evaluation. Recommending SNF for rehab prior to d/c home. Spoke with pt's brothers in session.    Follow Up Recommendations  SNF;Supervision/Assistance - 24 hour    Equipment Recommendations  Other (comment) (defer to next venue)    Recommendations for Other Services       Precautions / Restrictions Precautions Precautions: Fall Restrictions Weight Bearing Restrictions: No      Mobility Bed Mobility Overal bed mobility: Needs Assistance Bed Mobility: Supine to Sit;Sit to Supine     Supine to sit: Max assist Sit to supine: Max assist   General bed mobility comments: Assistance with hips/trunk when going to sit EOB. Cues for technique.  Transfers Overall transfer level: Needs assistance Equipment used: Rolling walker (2 wheeled) Transfers: Sit to/from [employer] Sit to Stand: Max assist Stand pivot transfers: Mod assist       General transfer comment: cues for technique. Assistance to rise.     Balance                                            ADL Overall ADL's : Needs assistance/impaired     Grooming: Minimal assistance;Moderate assistance;Standing;Wash/dry hands;Wash/dry face;Oral care;Applying deodorant (decreased balance)       Lower Body Bathing: Sit to/from stand;Moderate assistance;Maximal assistance   Upper Body Dressing : Set up;Supervision/safety;Sitting   Lower Body Dressing: Sit to/from stand;Moderate assistance;Maximal assistance   Toilet Transfer: Moderate assistance;Stand-pivot;Ambulation;RW (from bed to chair; took a few steps)            Functional mobility during ADLs: Moderate assistance;+2 for safety/equipment;Rolling walker General ADL Comments: Pt stood at sink and performed ADLs. OT pulled chair to sink and had pt stand (did not feel safe ambulating with +1 assist to bathroom). Spoke with family about recommending SNF for rehab for a while.      Vision                     Perception     Praxis      Pertinent Vitals/Pain Pain Assessment: Faces Faces Pain Scale: Hurts even more Pain Location: back and neck Pain Descriptors / Indicators: Grimacing;Moaning Pain Intervention(s): Monitored during session;Repositioned     Hand Dominance     Extremity/Trunk Assessment Upper Extremity Assessment Upper Extremity Assessment: Generalized weakness   Lower Extremity Assessment Lower Extremity Assessment: Defer to PT evaluation       Communication Communication Communication: HOH   Cognition Arousal/Alertness: Awake/alert;Lethargic (lethargic towards beginning of session) Behavior During Therapy: WFL for tasks assessed/performed Overall Cognitive Status: Impaired/Different from baseline Area of Impairment: Problem solving;Following commands       Following Commands: Follows one step commands inconsistently;Follows one step commands with increased time     Problem Solving: Slow processing;Difficulty sequencing     General Comments       Exercises       Shoulder Instructions      Home Living Family/patient expects to be discharged to:: Private residence Living Arrangements: Alone Available Help at Discharge:  Family;Available 24 hours/day (have been taking turns taking care of pt) Type of Home: House Home Access: Ramped entrance     Home Layout: One level               Home Equipment: Walker - 2 wheels   Additional Comments: Up until 3 weeks ago was in [facility] post fall.  Pt's family has been with him pretty much 24/7      Prior Functioning/Environment Level of  Independence: Independent;Needs assistance  Gait / Transfers Assistance Needed: No assistive device for ambulation.  Was having increased falls. ADL's / Homemaking Assistance Needed: Brothers provided meals.        OT Diagnosis: Acute pain;Generalized weakness   OT Problem List: Decreased strength;Decreased cognition;Impaired balance (sitting and/or standing);Decreased activity tolerance;Decreased knowledge of use of DME or AE;Decreased knowledge of precautions;Pain   OT Treatment/Interventions: Self-care/ADL training;DME and/or AE instruction;Therapeutic activities;Patient/family education;Balance training;Cognitive remediation/compensation;Therapeutic exercise    OT Goals(Current goals can be found in the care plan section) Acute Rehab OT Goals Patient Stated Goal: not stated OT Goal Formulation: With patient/family Time For Goal Achievement: 12/26/13 Potential to Achieve Goals: Good ADL Goals Pt Will Perform Grooming: with supervision;standing;with set-up Pt Will Perform Lower Body Bathing: with set-up;sit to/from stand;with supervision Pt Will Perform Lower Body Dressing: with set-up;with supervision;sit to/from stand Pt Will Transfer to Toilet: with supervision;ambulating;bedside commode Pt Will Perform Toileting - Clothing Manipulation and hygiene: with supervision;sit to/from stand  OT Frequency: Min 2X/week   Barriers to D/C:            Co-evaluation              End of Session Equipment Utilized During Treatment: Gait belt;Rolling walker  Activity Tolerance: Patient tolerated treatment well Patient left: in bed;with call bell/phone within reach;with bed alarm set;with family/visitor present;with nursing/sitter in room   Time: 1152-1227 OT Time Calculation (min): 35 min Charges:  OT General Charges $OT Visit: 1 Procedure OT Evaluation $Initial OT Evaluation Tier I: 1 Procedure OT Treatments $Self Care/Home Management : 8-22 mins G-CodesEarlie Raveling OTR/L [phone] 12/19/2013, 1:46 PM

## 2013-12-19 NOTE — Progress Notes (Signed)
[facility] PROGRESS NOTE  Terrance Adams:[id number] DOB: 1921/07/14 DOA: 12/17/2013 PCP: Julian Hy, MD  Assessment/Plan: 1-Encephalopathy: - MRI; No acute infarction seen.  -Will do work up for infection: Urine culture pending, Chest x ray: bibasilar atelectasis.  -B-12  315. Will start supplement.  Thiamine pending.  - troponin negative.  -TSH 1.5, ammonia 14.  -Start empirically ceftriaxone.   2-Right side weakness, slurred  Speech: probably related to encephalopathy.  MRI;  No acute infarction seen. ECHO;Left ventricle: Images are severely limited. There is an impression that systolic function is OK. The cavity size was normal.  Doppler;  1-39% ICA stenosis.  Continue with aspirin. Hb A1c 6.4. LDL 61.    Code Status: DNR. Family Communication: Care discussed with brothers.  Disposition Plan: To be determine, might need placement.    Consultants:  Neurology.   Procedures:  ECHO; Pending.   Doppler: Pending.   Antibiotics:  none  HPI/Subjective: More alert today, wants mittens to be remove. No complaints.   Objective: Filed Vitals:   12/19/13 1751  BP: 137/66  Pulse: 73  Temp: 97.6 F (36.4 C)  Resp: 16    Intake/Output Summary (Last 24 hours) at 12/19/13 1949 Last data filed at 12/19/13 1752  Gross per 24 hour  Intake    480 ml  Output   1075 ml  Net   -595 ml   Filed Weights   12/17/13 1212  Weight: 56.246 kg (124 lb)    Exam:   General:  No distress. Open eyes, follow some command.   Cardiovascular: S 1, S 2 RRR  Respiratory: Ronchus.   Abdomen: Bs present, soft.   Musculoskeletal: no edema.   Data Reviewed: Basic Metabolic Panel:  Recent Labs Lab 12/17/13 1155 12/18/13 1222 12/19/13 0511  NA 137 140 142  K 4.3 3.8 4.0  CL 101 103 108  CO2 GLUCOSE 138* 97 93  BUN CREATININE 0.94 0.67 0.68  CALCIUM 8.6 8.3* 7.9*   Liver Function Tests:  Recent Labs Lab 12/17/13 1155  AST 19   ALT 9  ALKPHOS 79  BILITOT 0.3  PROT 6.6  ALBUMIN 2.7*   No results found for this basename: LIPASE, AMYLASE,  in the last 168 hours  Recent Labs Lab 12/18/13 1222  AMMONIA 14   CBC:  Recent Labs Lab 12/17/13 1155 12/19/13 0511  WBC 8.1 8.4  NEUTROABS 5.3  --   HGB 10.1* 9.7*  HCT 32.4* 30.0*  MCV 88.5 86.0  PLT 381 329   Cardiac Enzymes:  Recent Labs Lab 12/18/13 1610 12/18/13 2150 12/19/13 0511  TROPONINI <0.30 <0.30 <0.30   BNP (last 3 results) No results found for this basename: PROBNP,  in the last 8760 hours CBG:  Recent Labs Lab 12/17/13 1222  GLUCAP 116*    Recent Results (from the past 240 hour(s))  URINE CULTURE     Status: None   Collection Time    12/18/13  2:35 PM      Result Value Ref Range Status   Specimen Description URINE, CATHETERIZED   Final   Special Requests NONE   Final   Culture  Setup Time     Final   Value: 12/18/2013 19:20     Performed at [facility] Count PENDING   Incomplete   Culture     Final   Value: Culture reincubated for better growth     Performed at [facility]  Report Status PENDING   Incomplete     Studies: Dg Chest 1 View  12/18/2013   CLINICAL DATA:  Stroke.  EXAM: CHEST - 1 VIEW  COMPARISON:  Two-view chest x-ray 10/08/2013, 03/27/2011. CT chest 01/08/2010.  FINDINGS: Suboptimal inspiration accounts for crowded bronchovascular markings diffusely and atelectasis in the bases, and accentuates the cardiac silhouette. Stable chronic elevation of the left hemidiaphragm. Cardiac silhouette upper normal in size for AP technique. Thoracic aorta tortuous and atherosclerotic, unchanged. Emphysematous changes in the upper lobes. Lungs otherwise clear without confluent airspace consolidation. No visible pleural effusions.  IMPRESSION: Suboptimal inspiration accounts for bibasilar atelectasis, superimposed upon COPD/emphysema. No acute cardiopulmonary disease otherwise. Stable chronic elevation  of the left hemidiaphragm.   Electronically Signed   By: Hulan Saas M.D.   On: 12/18/2013 09:31   Mr Brain Wo Contrast  12/18/2013   CLINICAL DATA:  One day history of right-sided weakness and slurred speech. Confusion. Unable to hold still.  EXAM: MRI HEAD WITHOUT CONTRAST  MRA HEAD WITHOUT CONTRAST  TECHNIQUE: Multiplanar, multiecho pulse sequences of the brain and surrounding structures were obtained without intravenous contrast. Angiographic images of the head were obtained using MRA technique without contrast.  COMPARISON:  Head CT 12/17/2013.  FINDINGS: MRI HEAD FINDINGS  Images are degraded by motion.  Diffusion imaging does not show any acute or subacute infarction. The brainstem is normal. There are a few old small vessel cerebellar infarctions. The cerebral hemispheres show old small vessel infarctions within the thalami basal ganglia and chronic small vessel changes throughout the white matter. No cortical or large vessel territory infarction. No mass lesion, hemorrhage, hydrocephalus or extra-axial collection. There is opacification of the right maxillary sinus  MRA HEAD FINDINGS  Both internal carotid arteries are widely patent into the brain. The anterior and middle cerebral vessels are patent without proximal stenosis, aneurysm or vascular malformation. A1 segment on the right is diminutive.  Both vertebral arteries are patent. There is 50% stenosis of the right vertebral artery at the foramen magnum. No basilar stenosis. Posterior circulation branch vessels appear normal.  IMPRESSION: Motion degraded exam.  No acute infarction seen.  Ordinary chronic small-vessel ischemic changes throughout the brain, typical for a person of this age.  No major vessel occlusion or correctable proximal stenosis.   Electronically Signed   By: Paulina Fusi M.D.   On: 12/18/2013 09:31   Mr Maxine Glenn Head/brain Wo Cm  12/18/2013   CLINICAL DATA:  One day history of right-sided weakness and slurred speech. Confusion.  Unable to hold still.  EXAM: MRI HEAD WITHOUT CONTRAST  MRA HEAD WITHOUT CONTRAST  TECHNIQUE: Multiplanar, multiecho pulse sequences of the brain and surrounding structures were obtained without intravenous contrast. Angiographic images of the head were obtained using MRA technique without contrast.  COMPARISON:  Head CT 12/17/2013.  FINDINGS: MRI HEAD FINDINGS  Images are degraded by motion.  Diffusion imaging does not show any acute or subacute infarction. The brainstem is normal. There are a few old small vessel cerebellar infarctions. The cerebral hemispheres show old small vessel infarctions within the thalami basal ganglia and chronic small vessel changes throughout the white matter. No cortical or large vessel territory infarction. No mass lesion, hemorrhage, hydrocephalus or extra-axial collection. There is opacification of the right maxillary sinus  MRA HEAD FINDINGS  Both internal carotid arteries are widely patent into the brain. The anterior and middle cerebral vessels are patent without proximal stenosis, aneurysm or vascular malformation. A1 segment on the right is diminutive.  Both vertebral arteries are patent. There is 50% stenosis of the right vertebral artery at the foramen magnum. No basilar stenosis. Posterior circulation branch vessels appear normal.  IMPRESSION: Motion degraded exam.  No acute infarction seen.  Ordinary chronic small-vessel ischemic changes throughout the brain, typical for a person of this age.  No major vessel occlusion or correctable proximal stenosis.   Electronically Signed   By: Paulina Fusi M.D.   On: 12/18/2013 09:31    Scheduled Meds: . aspirin  325 mg Oral Daily  . cyanocobalamin  100 mcg Intramuscular Daily  . heparin  5,000 Units Subcutaneous 3 times per day   Continuous Infusions: . sodium chloride 50 mL/hr at 12/19/13 6962    Principal Problem:   Acute confusional state secondary to UTI    Time spent: 35 minutes.     Hartley Barefoot  A  [facility] Pager [phone]. If 7PM-7AM, please contact night-coverage at [email or web address], password [facility] 12/19/2013, 7:49 PM  LOS: 2 days

## 2013-12-19 NOTE — Progress Notes (Addendum)
VASCULAR LAB PRELIMINARY  PRELIMINARY  PRELIMINARY  PRELIMINARY  Carotid Dopplers completed.    Preliminary report:  1-39% ICA stenosis.    Maleaha Hughett, RVT 12/19/2013, 11:01 AM

## 2013-12-19 NOTE — Progress Notes (Signed)
STROKE TEAM PROGRESS NOTE   HISTORY Terrance Adams is an 80 y.o. male history urinary tract infection, presenting with new onset slurred age in right-sided weakness. He was last well at 8 AM today 12/17/2013. There is no previous history of stroke or TIA. He has been taking aspirin 81 mg per day. CT scan of his head was unremarkable. NIH stroke score was 7. Patient's deficits improved fairly rapidly.   tPA Given: No: Rapidly resolving deficits    SUBJECTIVE (INTERVAL HISTORY) Patient lying in bed, easily awakens. No family or friends at the bedside.   OBJECTIVE Temp:  [98.1 F (36.7 C)-98.6 F (37 C)] 98.6 F (37 C) (09/20 2102) Pulse Rate:  [72-103] 103 (09/20 2102) Cardiac Rhythm:  [-] Normal sinus rhythm (09/21 0800) Resp:  [18-20] 18 (09/20 2102) BP: (105-122)/(50-78) 122/78 mmHg (09/20 2102) SpO2:  [96 %-98 %] 98 % (09/20 2102)   Recent Labs Lab 12/17/13 1222  GLUCAP 116*    Recent Labs Lab 12/17/13 1155 12/18/13 1222 12/19/13 0511  NA 137 140 142  K 4.3 3.8 4.0  CL 101 103 108  CO2 GLUCOSE 138* 97 93  BUN CREATININE 0.94 0.67 0.68  CALCIUM 8.6 8.3* 7.9*    Recent Labs Lab 12/17/13 1155  AST 19  ALT 9  ALKPHOS 79  BILITOT 0.3  PROT 6.6  ALBUMIN 2.7*    Recent Labs Lab 12/17/13 1155 12/19/13 0511  WBC 8.1 8.4  NEUTROABS 5.3  --   HGB 10.1* 9.7*  HCT 32.4* 30.0*  MCV 88.5 86.0  PLT 381 329    Recent Labs Lab 12/18/13 1610 12/18/13 2150 12/19/13 0511  TROPONINI <0.30 <0.30 <0.30    Recent Labs  12/17/13 1155  LABPROT 14.0  INR 1.08    Recent Labs  12/17/13 1414  COLORURINE YELLOW  LABSPEC 1.015  PHURINE 7.0  GLUCOSEU NEGATIVE  HGBUR NEGATIVE  BILIRUBINUR NEGATIVE  KETONESUR NEGATIVE  PROTEINUR NEGATIVE  UROBILINOGEN 1.0  NITRITE NEGATIVE  LEUKOCYTESUR NEGATIVE       Component Value Date/Time   CHOL 140 12/18/2013 0421   TRIG 56 12/18/2013 0421   HDL 68 12/18/2013 0421   CHOLHDL 2.1 12/18/2013 0421    VLDL 11 12/18/2013 0421   LDLCALC 61 12/18/2013 0421   Lab Results  Component Value Date   HGBA1C 6.4* 12/18/2013      Component Value Date/Time   LABOPIA NONE DETECTED 03/28/2011 1140   COCAINSCRNUR NONE DETECTED 03/28/2011 1140   LABBENZ NONE DETECTED 03/28/2011 1140   AMPHETMU NONE DETECTED 03/28/2011 1140   THCU NONE DETECTED 03/28/2011 1140   LABBARB NONE DETECTED 03/28/2011 1140    No results found for this basename: ETH,  in the last 168 hours  Dg Chest 1 View 12/18/2013    Suboptimal inspiration accounts for bibasilar atelectasis, superimposed upon COPD/emphysema. No acute cardiopulmonary disease otherwise. Stable chronic elevation of the left hemidiaphragm.      Ct Head (brain) Wo Contrast 12/17/2013    1. No evidence of acute intracranial abnormality.  2. Mild-to-moderate chronic small vessel ischemic disease and cerebral atrophy.     MRI / MRA Brain Wo Contrast 12/18/2013    Motion degraded exam.  No acute infarction seen.  Ordinary chronic small-vessel ischemic changes throughout the brain, typical for a person of this age.  No major vessel occlusion or correctable proximal stenosis.      PHYSICAL EXAM NEUROLOGIC:  MENTAL STATUS: awake, alert, mildly confused. He  attempts to follow most commands. CRANIAL NERVES: pupils equal and reactive to light,extraocular muscles intact, Does not blink to threat on the right, facial  strength symmetric, uvula midlinec, tongue midline MOTOR: normal bulk and tone, Strength -he is able to move all extremities against gravity. There is a question of some mild right upper extremity weakness. REFLEXES: Brisk for age    ASSESSMENT/PLAN Terrance Adams is a 80 y.o. male with history of UTI presenting with slurred speech and right hemiparesis by report. However family at bedside say he has dementia and has been more confused the last week in the setting of a UTI. Nephew denies any speech slurring but endorses that patient has been  having difficulty recognizing family and his confusion is worse in the evenings. He also has not been sleeping well.  He did not receive IV t-PA due to rapidly resolving deficits. Imaging confirms no acute infarct.   Confusion due to metabolic/toxic encephalopathy from UTI in setting of baseline dementia. No acute stroke.   no antithrombotics prior to admission, now on aspirin 325 mg orally every day  MRI  no acute infarct  MRA  ordinary chronic small-vessel ischemic changes Carotid Doppler  No evidence of hemodynamically significant internal carotid artery stenosis. Vertebral artery flow is antegrade.   2D Echo  No obvious obvious source of embolus  LDL 61 no statin necessary as meeting goal of LDL <100. No lipid lowering agents prior to admission.  HgbA1c 6.4  Subcutaneous heparin for VTE prophylaxis  Resultant mild confusion  Dysphagia: Dysphagia 1, nectar thick liquids  Therapy recs:  HH PT  Other Pertinent History  Recent UTI  Other Stroke Risk factors  Advanced age  No further stroke workup indicated.  Ongoing risk factor control by Primary Care Physician  Stroke Service will sign off. Please call should any needs arise.  No neuro follow up indicated  Hospital day # 2  Annie Main, MSN, RN, ANVP-BC, ANP-BC, [facility] Pager: [phone] 12/19/2013 12:59 PM  I have personally examined this patient, reviewed notes, independently viewed imaging studies, participated in medical decision making and plan of care. I have made any additions or clarifications directly to the above note. Agree with note above.   Delia Heady, MD Medical Director [facility] Pager: [phone] 12/19/2013 7:31 PM      To contact Stroke Continuity provider, please refer to [email or web address]. After hours, contact General Neurology

## 2013-12-19 NOTE — Progress Notes (Signed)
Echo Lab  2D Echocardiogram completed.  Briar Sword L Lajuanda Penick, RDCS 12/19/2013 9:59 AM

## 2013-12-20 LAB — URINE CULTURE

## 2013-12-20 MED ORDER — ACETAMINOPHEN 500 MG PO TABS
500.0000 mg | ORAL_TABLET | Freq: Once | ORAL | Status: AC
  Administered 2013-12-20: 500 mg via ORAL
  Filled 2013-12-20: qty 1

## 2013-12-20 MED ORDER — IBUPROFEN 400 MG PO TABS
400.0000 mg | ORAL_TABLET | Freq: Four times a day (QID) | ORAL | Status: DC | PRN
  Filled 2013-12-20: qty 1

## 2013-12-20 NOTE — Clinical Social Work Psychosocial (Signed)
Clinical Social Work Department BRIEF PSYCHOSOCIAL ASSESSMENT 12/20/2013  Patient:  MARKHI, KLECKNER     Account Number:  [id number]     Admit date:  12/17/2013  Clinical Social Worker:  Mosie Epstein  Date/Time:  12/20/2013 03:18 PM  Referred by:  Physician  Date Referred:  12/20/2013 Referred for  SNF Placement   Other Referral:   none.   Interview type:  Family Other interview type:   CSW spoke with pt's brother, Macaulay Reicher ([phone]), regarding discharge disposition.    PSYCHOSOCIAL DATA Living Status:  ALONE Admitted from facility:   Level of care:   Primary support name:  Aleksi Brummet Primary support relationship to patient:  SIBLING Degree of support available:   Strong support system.    CURRENT CONCERNS Current Concerns  Post-Acute Placement   Other Concerns:   none.    SOCIAL WORK ASSESSMENT / PLAN CSW referred regarding discharge disposition when noted that pt's discharge disposition has changed from home with home health services to SNF.    CSW spoke with pt's brother regarding discharge disposition. Pt's brother stated pt currently lives alone. Pt's brother informed CSW pt has previously completed short-term rehabilitation at [facility] SNF and family would prefer for pt to return at time of discharge. CSW to continue to follow and assist with discharge planning needs.   Assessment/plan status:  Psychosocial Support/Ongoing Assessment of Needs Other assessment/ plan:   none.   Information/referral to community resources:   [facility] bed offers.    PATIENT'S/FAMILY'S RESPONSE TO PLAN OF CARE: Pt's brother understanding and agreeable to CSW plan of care. Pt's brother expressed no further questions or concerns at this time.       Marcelline Deist, LCSWA [phone]) Licensed Clinical Social Worker Neuroscience [phone]) and Medical ICU (66M)

## 2013-12-20 NOTE — Progress Notes (Signed)
[facility] PROGRESS NOTE  Terrance Adams ZOX:[id number] DOB: 1921-06-26 DOA: 12/17/2013 PCP: Julian Hy, MD  Assessment/Plan: 80 y.o. male  Presents to the hospital complaining of one-day complaint of right-sided weakness and slurred speech. Patient was also notice to be more sleepy than usual, confuse, weak. He had TIA, stroke work up which was negative. Patient presentation was thought to be secondary to metabolic, toxic encephalopathy in setting infection, presume UTI. He was started on IV ceftriaxone.  Patient has improved clinically, he is more alert, was able to participate with therapy. He also probably has underline dementia, family relates that he wonder in the house.   1-Encephalopathy, Acute metabolic, toxic.  - MRI; No acute infarction seen.  -work up for infection: Urine culture pending, Chest x ray: bibasilar atelectasis.  -B-12 : 315. Prior B-12 at 122 on (july 2015). Started on IM B-12 supplement.  Thiamine pending.  - troponin negative.  -TSH 1.5, ammonia 14.  -Started empirically ceftriaxone for UTI.  -Patient today is more alert, following command, was able to ambulate with PT in the hall.   2-Right side weakness, slurred  Speech: probably related to encephalopathy.  MRI;  No acute infarction seen. ECHO;Left ventricle: Images are severely limited. There is an impression that systolic function is OK. The cavity size was normal.  Doppler;  1-39% ICA stenosis.  Continue with aspirin. Hb A1c 6.4. LDL 61.   3-UTI; on empirically ceftriaxone. Follow urine culture. Day 2 antibiotics.   Code Status: DNR. Family Communication: Care discussed with brother.  Disposition Plan: Benefit form SNF, supervise environment. Might be able to be discharge 9-23   Consultants:  Neurology.   Procedures: ECHO; Left ventricle: Images are severely limited. There is an impression that systolic function is OK. The cavity size was normal. Wall thickness was normal. Images  were inadequate for LV wall motion assessment. - Aortic valve: Valve is calcified. But opening is present. Doppler does not suggest tight AS. - Right ventricle: The cavity size was normal. Systolic function was normal.   Doppler: Bilateral: moderate calcific plaque origin ICA. 1-39% ICA stenosis. Unable to visualize vertebrals secondary to body habitus and patient cooperation.     Antibiotics:  none  HPI/Subjective: Alert, complaining of neck pain, per brother this is chronic pain.    Objective: Filed Vitals:   12/20/13 1034  BP: 128/64  Pulse: 71  Temp: 97.9 F (36.6 C)  Resp: 20    Intake/Output Summary (Last 24 hours) at 12/20/13 1357 Last data filed at 12/20/13 0856  Gross per 24 hour  Intake    120 ml  Output    600 ml  Net   -480 ml   Filed Weights   12/17/13 1212  Weight: 56.246 kg (124 lb)    Exam:   General:  No distress. Alert in no distress.   Cardiovascular: S 1, S 2 RRR  Respiratory: Ronchus.   Abdomen: Bs present, soft.   Musculoskeletal: no edema.   Data Reviewed: Basic Metabolic Panel:  Recent Labs Lab 12/17/13 1155 12/18/13 1222 12/19/13 0511  NA 137 140 142  K 4.3 3.8 4.0  CL 101 103 108  CO2 GLUCOSE 138* 97 93  BUN CREATININE 0.94 0.67 0.68  CALCIUM 8.6 8.3* 7.9*   Liver Function Tests:  Recent Labs Lab 12/17/13 1155  AST 19  ALT 9  ALKPHOS 79  BILITOT 0.3  PROT 6.6  ALBUMIN 2.7*   No results found for  this basename: LIPASE, AMYLASE,  in the last 168 hours  Recent Labs Lab 12/18/13 1222  AMMONIA 14   CBC:  Recent Labs Lab 12/17/13 1155 12/19/13 0511  WBC 8.1 8.4  NEUTROABS 5.3  --   HGB 10.1* 9.7*  HCT 32.4* 30.0*  MCV 88.5 86.0  PLT 381 329   Cardiac Enzymes:  Recent Labs Lab 12/18/13 1610 12/18/13 2150 12/19/13 0511  TROPONINI <0.30 <0.30 <0.30   BNP (last 3 results) No results found for this basename: PROBNP,  in the last 8760 hours CBG:  Recent Labs Lab  12/17/13 1222  GLUCAP 116*    Recent Results (from the past 240 hour(s))  URINE CULTURE     Status: None   Collection Time    12/18/13  2:35 PM      Result Value Ref Range Status   Specimen Description URINE, CATHETERIZED   Final   Special Requests NONE   Final   Culture  Setup Time     Final   Value: 12/18/2013 19:20     Performed at [facility] Count PENDING   Incomplete   Culture     Final   Value: Culture reincubated for better growth     Performed at [facility]   Report Status PENDING   Incomplete     Studies: No results found.  Scheduled Meds: . aspirin  325 mg Oral Daily  . cefTRIAXone (ROCEPHIN)  IV  1 g Intravenous Q24H  . cyanocobalamin  100 mcg Intramuscular Daily  . heparin  5,000 Units Subcutaneous 3 times per day   Continuous Infusions: . sodium chloride 50 mL/hr at 12/19/13 1610    Principal Problem:   Acute confusional state secondary to UTI    Time spent: 25 minutes.     Hartley Barefoot A  [facility] Pager [phone]. If 7PM-7AM, please contact night-coverage at [email or web address], password [facility] 12/20/2013, 1:57 PM  LOS: 3 days

## 2013-12-20 NOTE — Clinical Social Work Placement (Signed)
Clinical Social Work Department CLINICAL SOCIAL WORK PLACEMENT NOTE 12/20/2013  Patient:  Terrance Adams, Terrance Adams  Account Number:  [id number] Admit date:  12/17/2013  Clinical Social Worker:  Mosie Epstein  Date/time:  12/20/2013 03:22 PM  Clinical Social Work is seeking post-discharge placement for this patient at the following level of care:   SKILLED NURSING   (*CSW will update this form in Epic as items are completed)   12/20/2013  Patient/family provided with [facility] Department of Clinical Social Work's list of facilities offering this level of care within the geographic area requested by the patient (or if unable, by the patient's family).  12/20/2013  Patient/family informed of their freedom to choose among providers that offer the needed level of care, that participate in Medicare, Medicaid or managed care program needed by the patient, have an available bed and are willing to accept the patient.  12/20/2013  Patient/family informed of [name]' ownership interest in [facility], as well as of the fact that they are under no obligation to receive care at this facility.  PASARR submitted to [name] on  PASARR number received on   [name] transmitted to all facilities in geographic area requested by pt/family on  12/20/2013 [name] transmitted to all facilities within larger geographic area on   Patient informed that his/her managed care company has contracts with or will negotiate with  certain facilities, including the following:     Patient/family informed of bed offers received:   Patient chooses bed at  Physician recommends and patient chooses bed at    Patient to be transferred to  on   Patient to be transferred to facility by  Patient and family notified of transfer on  Name of family member notified:    The following physician request were entered in Epic:   Additional Comments:  Lily Kocher [phone]) Licensed Clinical Social  Worker Neuroscience [phone]) and Medical ICU (61M)

## 2013-12-20 NOTE — Progress Notes (Signed)
Physical Therapy Treatment Patient Details Name: Terrance Adams MRN: [id number] DOB: January 14, 1922 Today's Date: 12/20/2013    History of Present Illness 80 y.o male admitted to [facility] on 12/17/13 with one day h/o R sided weakness and slurred speech.  He was originally thought to have a stroke, but MRI was negative.  Dx with encephalopathy, and likely UTI (urine culture pending).  Pt with significant PMHx of UTI.      PT Comments    Pt continues to need min to mod assist for all mobility.  He is at a high fall risk without assist and his poor cognition also makes him more at risk (i.e. Remembering precautions, remembering to use the RW, etc).  He would benefit from SNF level rehab at this time as he is not safe to return home alone.  PT will continue to follow acutely.    Follow Up Recommendations  SNF     Equipment Recommendations  None recommended by PT    Recommendations for Other Services   NA     Precautions / Restrictions Precautions Precautions: Fall Precaution Comments: unsteady on his feet    Mobility  Bed Mobility Overal bed mobility: Needs Assistance Bed Mobility: Supine to Sit     Supine to sit: Mod assist     General bed mobility comments: Mod assist to support trunk and help progress legs over the EOB.  Pt is relying heavily on upper extremity assist to get to sitting.   Transfers Overall transfer level: Needs assistance Equipment used: Rolling walker (2 wheeled) Transfers: Sit to/from Stand Sit to Stand: Mod assist         General transfer comment: Mod assist to support trunk to get to standing.  Pt needed assist with anterior weight shift and to support trunk over weak legs.   Ambulation/Gait Ambulation/Gait assistance: Min assist Ambulation Distance (Feet): 180 Feet Assistive device: Rolling walker (2 wheeled) Gait Pattern/deviations: Step-through pattern;Shuffle;Trunk flexed Gait velocity: decreased Gait velocity interpretation: <1.8 ft/sec,  indicative of risk for recurrent falls General Gait Details: slow, shuffling gait pattern, at end of walk pt started reporting neck pain (has very forward head posture).  assist needed at trunk for balance during gait. Per his brother, he did not use an assistive device to walk PTA.  Pt was asked this earlier in the session and could not accurately report if he did or did not use an assistive device.           Balance Overall balance assessment: Needs assistance Sitting-balance support: Feet supported;Bilateral upper extremity supported Sitting balance-Leahy Scale: Poor Sitting balance - Comments: needs min assist to prevent posterior LOB seated EOB.  Postural control: Posterior lean Standing balance support: Bilateral upper extremity supported Standing balance-Leahy Scale: Poor Standing balance comment: Min assist and RW to support trunk in standing.                     Cognition Arousal/Alertness: Awake/alert Behavior During Therapy: WFL for tasks assessed/performed Overall Cognitive Status: History of cognitive impairments - at baseline Area of Impairment: Orientation;Memory;Problem solving Orientation Level: Disoriented to;Place;Time;Situation   Memory: Decreased short-term memory Following Commands: Follows one step commands consistently     Problem Solving: Slow processing;Difficulty sequencing;Requires verbal cues;Requires tactile cues General Comments: Per brother's report his cognition started to decline earlier this year and he has even been found wandering.            Pertinent Vitals/Pain Pain Assessment: Faces Faces Pain Scale: Hurts even more  Pain Location: neck, posterior Pain Descriptors / Indicators: Aching Pain Intervention(s): Limited activity within patient's tolerance;Monitored during session;Repositioned           PT Goals (current goals can now be found in the care plan section) Acute Rehab PT Goals Patient Stated Goal: not  stated Progress towards PT goals: Progressing toward goals    Frequency  Min 2X/week    PT Plan Discharge plan needs to be updated       End of Session Equipment Utilized During Treatment: Gait belt Activity Tolerance: Patient tolerated treatment well Patient left: in chair;with call bell/phone within reach;with chair alarm set     Time: 1203-1243 PT Time Calculation (min): 40 min  Charges:  $Gait Training: 23-37 mins (did not charge for 10 mins while assisting him to eat )                      Lurena Joiner B. Cletis Muma, PT, DPT [phone]   12/20/2013, 3:34 PM

## 2013-12-20 NOTE — Progress Notes (Signed)
UR complete.  Kross Swallows RN, MSN 

## 2013-12-20 NOTE — Progress Notes (Signed)
Speech Language Pathology Treatment: Dysphagia  Patient Details Name: Terrance Adams MRN: [id number] DOB: 1921/11/04 Today's Date: 12/20/2013 Time: 2130-8657 SLP Time Calculation (min): 24 min  Assessment / Plan / Recommendation Clinical Impression  Pt appears with improved mental status/alertness transitioning to improved swallowing function.  SLP assisted pt with set up to attempt snacks including graham cracker, applesauce, nectar juice and water.  No s/s of aspiration or significant symptoms of dysphagia noted during intake.  Mildly delayed mastication noted with graham cracker - likely due to his weakness but no significant stasis.    RN reports good tolerance of po medicine with puree-crushed.  Pt noted to become tearful during testing, Joe (brother) reports this to occur at baseline as well.    Recommend advance diet to dys3/ground meats/thin with strict precautions.  SLP to follow up briefly for tolerance.    HPI HPI: pt admitted with a 1 day h/o R sided weakness and slurred speech.  MRI negative for stroke.    Pertinent Vitals Pain Assessment: Faces Faces Pain Scale: Hurts even more Pain Location: neck Pain Descriptors / Indicators: Moaning Pain Intervention(s): Repositioned;Monitored during session  SLP Plan  Continue with current plan of care    Recommendations Diet recommendations: Dysphagia 3 (mechanical soft);Thin liquid Liquids provided via: Cup;Straw Medication Administration: Whole meds with puree Supervision: Full supervision/cueing for compensatory strategies Compensations: Slow rate;Small sips/bites;Check for pocketing Postural Changes and/or Swallow Maneuvers: Seated upright 90 degrees;Upright 30-60 min after meal              Oral Care Recommendations: Oral care BID Follow up Recommendations: Skilled Nursing facility;24 hour supervision/assistance Plan: Continue with current plan of care    GO     Mills Koller, MS [facility]  SLP [phone]

## 2013-12-20 NOTE — Care Management Note (Signed)
    Page 1 of 1   12/20/2013     1:38:19 PM CARE MANAGEMENT NOTE 12/20/2013  Patient:  Terrance Adams, Terrance Adams   Account Number:  [id number]  Date Initiated:  12/20/2013  Documentation initiated by:  Elmer Bales  Subjective/Objective Assessment:   Patient was admitted with encephalopathy and delerium. Lives at home with family     Action/Plan:   Will follow for discharge needs pending PT/OT evals and physician orders.   Anticipated DC Date:     Anticipated DC Plan:  HOME W HOME HEALTH SERVICES         Choice offered to / List presented to:             Status of service:   Medicare Important Message given?  YES (If response is "NO", the following Medicare IM given date fields will be blank) Date Medicare IM given:  12/20/2013 Medicare IM given by:  Elmer Bales Date Additional Medicare IM given:   Additional Medicare IM given by:    Discharge Disposition:    Per UR Regulation:  Reviewed for med. necessity/level of care/duration of stay  If discussed at Long Length of Stay Meetings, dates discussed:    Comments:  12/20/13 1330 Elmer Bales RN, MSN, CM- Medicare IM letter provided.

## 2013-12-21 MED ORDER — CYANOCOBALAMIN 500 MCG PO TABS: 500.0000 ug | ORAL_TABLET | Freq: Every day | ORAL | Status: AC

## 2013-12-21 MED ORDER — CYANOCOBALAMIN 500 MCG PO TABS
500.0000 ug | ORAL_TABLET | Freq: Every day | ORAL | Status: DC
  Administered 2013-12-21: 500 ug via ORAL
  Filled 2013-12-21 (×2): qty 1

## 2013-12-21 MED ORDER — ACETAMINOPHEN 325 MG PO TABS: 650.0000 mg | ORAL_TABLET | ORAL | Status: AC | PRN

## 2013-12-21 MED ORDER — TRAZODONE HCL 50 MG PO TABS: 25.0000 mg | ORAL_TABLET | Freq: Every evening | ORAL | Status: AC | PRN

## 2013-12-21 MED ORDER — SENNOSIDES-DOCUSATE SODIUM 8.6-50 MG PO TABS
1.0000 | ORAL_TABLET | Freq: Every day | ORAL | Status: AC
Start: 2013-12-21 — End: ?

## 2013-12-21 MED ORDER — CEFPODOXIME PROXETIL 200 MG PO TABS
200.0000 mg | ORAL_TABLET | Freq: Two times a day (BID) | ORAL | Status: DC
  Administered 2013-12-21: 200 mg via ORAL
  Filled 2013-12-21 (×3): qty 1

## 2013-12-21 MED ORDER — CEFPODOXIME PROXETIL 200 MG PO TABS: 200.0000 mg | ORAL_TABLET | Freq: Two times a day (BID) | ORAL | Status: DC

## 2013-12-21 NOTE — Progress Notes (Signed)
Report was caled to SNF ([facility]) patient transportation is plan at 1300

## 2013-12-21 NOTE — Clinical Social Work Placement (Signed)
Clinical Social Work Department CLINICAL SOCIAL WORK PLACEMENT NOTE 12/21/2013  Patient:  Terrance Adams, Terrance Adams  Account Number:  [id number] Admit date:  12/17/2013  Clinical Social Worker:  Mosie Epstein  Date/time:  12/20/2013 03:22 PM  Clinical Social Work is seeking post-discharge placement for this patient at the following level of care:   SKILLED NURSING   (*CSW will update this form in Epic as items are completed)   12/20/2013  Patient/family provided with [facility] Department of Clinical Social Work's list of facilities offering this level of care within the geographic area requested by the patient (or if unable, by the patient's family).  12/20/2013  Patient/family informed of their freedom to choose among providers that offer the needed level of care, that participate in Medicare, Medicaid or managed care program needed by the patient, have an available bed and are willing to accept the patient.  12/20/2013  Patient/family informed of [name]' ownership interest in [facility], as well as of the fact that they are under no obligation to receive care at this facility.  PASARR submitted to [name] on  PASARR number received on   [name] transmitted to all facilities in geographic area requested by pt/family on  12/20/2013 [name] transmitted to all facilities within larger geographic area on   Patient informed that his/her managed care company has contracts with or will negotiate with  certain facilities, including the following:     Patient/family informed of bed offers received:  12/21/2013 Patient chooses bed at [facility] Physician recommends and patient chooses bed at    Patient to be transferred to [facility] on  12/21/2013 Patient to be transferred to facility by Ambulance Patient and family notified of transfer on 12/21/2013 Name of family member notified:  Fayrene Fearing  The following physician request were entered  in Epic:   Additional Comments:  Per MD patient ready for DC to [facility]. RN, patient, patient's family, and facility notified of DC. RN given number for report. DC packet on chart. [facility] transport requested for patient. CSW signing off.   Roddie Mc MSW, [place], [place], [phone]

## 2013-12-21 NOTE — Progress Notes (Signed)
Speech Language Pathology Treatment: Dysphagia  Patient Details Name: Terrance Adams MRN: [id number] DOB: 16-Apr-1921 Today's Date: 12/21/2013 Time: 0800-0810 SLP Time Calculation (min): 10 min  Assessment / Plan / Recommendation Clinical Impression  Pt observed being fed by nurse technician with good tolerance.  Effective mastication noted without oral residuals nor symptoms of aspiration.  Jasmine (English as a second language teacher) feeding pt at slow rate; appreciate nurse technician's assistance and advise continue full supervision due to pt's impulsivity with po intake due to cognitive deficit.    Pt is tolerating po diet at this time, recommend continue dys3/ground/thin due to generalized weakness.  SLP to sign off as no further intervention warranted at this time.    HPI HPI: pt admitted with a 1 day h/o R sided weakness and slurred speech.  MRI negative for stroke. Seen for swallow evaluation and placed on modified diet due to lethargy.     Pertinent Vitals Pain Assessment: Faces Pain Score: 0-No pain  SLP Plan  All goals met    Recommendations Diet recommendations: Dysphagia 3 (mechanical soft);Thin liquid Liquids provided via: Cup;Straw Medication Administration: Whole meds with puree Supervision: Full supervision/cueing for compensatory strategies Compensations: Slow rate;Small sips/bites;Check for pocketing Postural Changes and/or Swallow Maneuvers: Seated upright 90 degrees;Upright 30-60 min after meal              Oral Care Recommendations: Oral care BID Follow up Recommendations: Skilled Nursing facility;24 hour supervision/assistance Plan: All goals met    [place], [facility] [phone]

## 2013-12-21 NOTE — Progress Notes (Signed)
Pt was discharged to [facility]. Discharge package was given to transportation

## 2013-12-21 NOTE — Discharge Summary (Signed)
[facility]  Physician Discharge Summary   Patient ID: Terrance Adams MRN: [id number] DOB/AGE: [birth year] 78 y.o.  Admit date: 12/17/2013 Discharge date: 12/21/2013  PCP: Julian Hy, MD  DISCHARGE DIAGNOSES:  Principal Problem:   Acute confusional state secondary to UTI Active Problems:   UTI (lower urinary tract infection)   RECOMMENDATIONS FOR OUTPATIENT FOLLOW UP: 1. Patient being discharged to SNF  DISCHARGE CONDITION: fair  Diet recommendation: Dysphagia 3 Heart Healthy  Filed Weights   12/17/13 1212  Weight: 56.246 kg (124 lb)    INITIAL HISTORY: 80 y.o. male presents to the hospital complaining of one-day complaint of right-sided weakness and slurred speech. Patient was also noticed to be more sleepy than usual. He had TIA/stroke work up which was negative. Patient presentation was thought to be secondary to metabolic, toxic encephalopathy in setting infection, presumed UTI. He was started on IV ceftriaxone. Patient has improved clinically. He is more alert, was able to participate with therapy. He also probably has underlying dementia.   Consultations:  Neurology  Procedures:  2D ECHO: Left ventricle: Images are severely limited. There is an impression that systolic function is OK. The cavity size was normal. Wall thickness was normal. Images were inadequate for LV wall motion assessment. - Aortic valve: Valve is calcified. But opening is present. Doppler does not suggest tight AS. - Right ventricle: The cavity size was normal. Systolic function was normal.  Carotid Doppler: Bilateral: moderate calcific plaque origin ICA. 1-39% ICA stenosis. Unable to visualize vertebrals secondary to body habitus and patient cooperation.  HOSPITAL COURSE:   Acute Encephalopathy MRI did not show an acute stroke. His encephalopathy is thought to be due to UTI. He is better. Still confused at times. Likely has underlying cognitive impairment. Will need SNF  placement. B-12 was 315. Prior B-12 at 122 on (july 2015). He was initially started on IM B-12 supplement. He will be changed over to oral B12. Troponin negative. TSH 1.5, ammonia 14. Patient was able to ambulate with PT in the hall.   Right side weakness, slurred Speech Probably related to encephalopathy. MRI did not reveal stroke. Continue with aspirin. Hb A1c 6.4. LDL 61.   UTI Urine culture showed multiple morphologies. He was empirically started on ceftriaxone. Will change to oral cefpodoxime.   Discussed with his brothers at bedside. They are agreeable with the plan for SNF. They are waiting to speak with the social worker. Patient is stable for discharge.   PERTINENT LABS:  The results of significant diagnostics from this hospitalization (including imaging, microbiology, ancillary and laboratory) are listed below for reference.    Microbiology: Recent Results (from the past 240 hour(s))  URINE CULTURE     Status: None   Collection Time    12/18/13  2:35 PM      Result Value Ref Range Status   Specimen Description URINE, CATHETERIZED   Final   Special Requests NONE   Final   Culture  Setup Time     Final   Value: 12/18/2013 19:20     Performed at [facility] Count     Final   Value: >=100,000 COLONIES/ML     Performed at [facility]   Culture     Final   Value: Multiple bacterial morphotypes present, none predominant. Suggest appropriate recollection if clinically indicated.     Performed at [facility]   Report Status 12/20/2013 FINAL   Final     Labs: Basic Metabolic Panel:  Recent Labs Lab 12/17/13 1155 12/18/13 1222 12/19/13 0511  NA 137 140 142  K 4.3 3.8 4.0  CL 101 103 108  CO2 GLUCOSE 138* 97 93  BUN CREATININE 0.94 0.67 0.68  CALCIUM 8.6 8.3* 7.9*   Liver Function Tests:  Recent Labs Lab 12/17/13 1155  AST 19  ALT 9  ALKPHOS 79  BILITOT 0.3  PROT 6.6  ALBUMIN 2.7*    Recent  Labs Lab 12/18/13 1222  AMMONIA 14   CBC:  Recent Labs Lab 12/17/13 1155 12/19/13 0511  WBC 8.1 8.4  NEUTROABS 5.3  --   HGB 10.1* 9.7*  HCT 32.4* 30.0*  MCV 88.5 86.0  PLT 381 329   Cardiac Enzymes:  Recent Labs Lab 12/18/13 1610 12/18/13 2150 12/19/13 0511  TROPONINI <0.30 <0.30 <0.30   CBG:  Recent Labs Lab 12/17/13 1222  GLUCAP 116*     IMAGING STUDIES Dg Chest 1 View  12/18/2013   CLINICAL DATA:  Stroke.  EXAM: CHEST - 1 VIEW  COMPARISON:  Two-view chest x-ray 10/08/2013, 03/27/2011. CT chest 01/08/2010.  FINDINGS: Suboptimal inspiration accounts for crowded bronchovascular markings diffusely and atelectasis in the bases, and accentuates the cardiac silhouette. Stable chronic elevation of the left hemidiaphragm. Cardiac silhouette upper normal in size for AP technique. Thoracic aorta tortuous and atherosclerotic, unchanged. Emphysematous changes in the upper lobes. Lungs otherwise clear without confluent airspace consolidation. No visible pleural effusions.  IMPRESSION: Suboptimal inspiration accounts for bibasilar atelectasis, superimposed upon COPD/emphysema. No acute cardiopulmonary disease otherwise. Stable chronic elevation of the left hemidiaphragm.   Electronically Signed   By: Hulan Saas M.D.   On: 12/18/2013 09:31   Ct Head (brain) Wo Contrast  12/17/2013   CLINICAL DATA:  Code stroke. Right-sided weakness, difficulty walking, and facial droop.  EXAM: CT HEAD WITHOUT CONTRAST  TECHNIQUE: Contiguous axial images were obtained from the base of the skull through the vertex without intravenous contrast.  COMPARISON:  10/08/2013  FINDINGS: There is no evidence of acute cortical infarct, intracranial hemorrhage, mass, midline shift, or extra-axial fluid collection. There is mild generalized cerebral atrophy, unchanged. Periventricular white matter hypodensities are unchanged and nonspecific but compatible with mild to moderate chronic small vessel ischemic  disease.  Prior bilateral cataract extraction is noted. Chronic maxillary sinusitis is again seen with persistent, complete opacification of the sinus. Mild right frontal sinus and bilateral anterior ethmoid air cell mucosal thickening is again seen. Mastoid air cells are clear. Right mastoid sclerosis is again noted.  IMPRESSION: 1. No evidence of acute intracranial abnormality. 2. Mild-to-moderate chronic small vessel ischemic disease and cerebral atrophy.   Electronically Signed   By: Sebastian Ache   On: 12/17/2013 12:12   Mr Brain Wo Contrast  12/18/2013   CLINICAL DATA:  One day history of right-sided weakness and slurred speech. Confusion. Unable to hold still.  EXAM: MRI HEAD WITHOUT CONTRAST  MRA HEAD WITHOUT CONTRAST  TECHNIQUE: Multiplanar, multiecho pulse sequences of the brain and surrounding structures were obtained without intravenous contrast. Angiographic images of the head were obtained using MRA technique without contrast.  COMPARISON:  Head CT 12/17/2013.  FINDINGS: MRI HEAD FINDINGS  Images are degraded by motion.  Diffusion imaging does not show any acute or subacute infarction. The brainstem is normal. There are a few old small vessel cerebellar infarctions. The cerebral hemispheres show old small vessel infarctions within the thalami basal ganglia and chronic small vessel changes throughout the white matter. No  cortical or large vessel territory infarction. No mass lesion, hemorrhage, hydrocephalus or extra-axial collection. There is opacification of the right maxillary sinus  MRA HEAD FINDINGS  Both internal carotid arteries are widely patent into the brain. The anterior and middle cerebral vessels are patent without proximal stenosis, aneurysm or vascular malformation. A1 segment on the right is diminutive.  Both vertebral arteries are patent. There is 50% stenosis of the right vertebral artery at the foramen magnum. No basilar stenosis. Posterior circulation branch vessels appear normal.   IMPRESSION: Motion degraded exam.  No acute infarction seen.  Ordinary chronic small-vessel ischemic changes throughout the brain, typical for a person of this age.  No major vessel occlusion or correctable proximal stenosis.   Electronically Signed   By: Paulina Fusi M.D.   On: 12/18/2013 09:31   Mr Maxine Glenn Head/brain Wo Cm  12/18/2013   CLINICAL DATA:  One day history of right-sided weakness and slurred speech. Confusion. Unable to hold still.  EXAM: MRI HEAD WITHOUT CONTRAST  MRA HEAD WITHOUT CONTRAST  TECHNIQUE: Multiplanar, multiecho pulse sequences of the brain and surrounding structures were obtained without intravenous contrast. Angiographic images of the head were obtained using MRA technique without contrast.  COMPARISON:  Head CT 12/17/2013.  FINDINGS: MRI HEAD FINDINGS  Images are degraded by motion.  Diffusion imaging does not show any acute or subacute infarction. The brainstem is normal. There are a few old small vessel cerebellar infarctions. The cerebral hemispheres show old small vessel infarctions within the thalami basal ganglia and chronic small vessel changes throughout the white matter. No cortical or large vessel territory infarction. No mass lesion, hemorrhage, hydrocephalus or extra-axial collection. There is opacification of the right maxillary sinus  MRA HEAD FINDINGS  Both internal carotid arteries are widely patent into the brain. The anterior and middle cerebral vessels are patent without proximal stenosis, aneurysm or vascular malformation. A1 segment on the right is diminutive.  Both vertebral arteries are patent. There is 50% stenosis of the right vertebral artery at the foramen magnum. No basilar stenosis. Posterior circulation branch vessels appear normal.  IMPRESSION: Motion degraded exam.  No acute infarction seen.  Ordinary chronic small-vessel ischemic changes throughout the brain, typical for a person of this age.  No major vessel occlusion or correctable proximal stenosis.    Electronically Signed   By: Paulina Fusi M.D.   On: 12/18/2013 09:31    DISCHARGE EXAMINATION: Filed Vitals:   12/20/13 2142 12/21/13 0200 12/21/13 0557 12/21/13 1015  BP: 116/53 141/86 144/64 138/58  Pulse: 69 76 74 70  Temp: 98.6 F (37 C) 98.2 F (36.8 C) 98.2 F (36.8 C) 97.6 F (36.4 C)  TempSrc: Oral Oral Oral Oral  Resp: Height:      Weight:      SpO2: 98% 100% 95% 95%   General appearance: alert, distracted and no distress Resp: clear to auscultation bilaterally Cardio: regular rate and rhythm, S1, S2 normal, no murmur, click, rub or gallop GI: soft, non-tender; bowel sounds normal; no masses,  no organomegaly Neurologic: Awake. Alert. Oriented to brothers. No facial droop. No motor deficits.   DISPOSITION: SNF  ALLERGIES: No Known Allergies  Current Discharge Medication List    START taking these medications   Details  acetaminophen (TYLENOL) 325 MG tablet Take 2 tablets (650 mg total) by mouth every 4 (four) hours as needed for mild pain, fever or headache.    cefpodoxime (VANTIN) 200 MG tablet Take 1 tablet (200  mg total) by mouth every 12 (twelve) hours. For 4 more days.    cyanocobalamin 500 MCG tablet Take 1 tablet (500 mcg total) by mouth daily.    senna-docusate (SENOKOT-S) 8.6-50 MG per tablet Take 1 tablet by mouth at bedtime.      CONTINUE these medications which have CHANGED   Details  traZODone (DESYREL) 50 MG tablet Take 0.5 tablets (25 mg total) by mouth at bedtime as needed for sleep.      CONTINUE these medications which have NOT CHANGED   Details  furosemide (LASIX) 20 MG tablet Take 20 mg by mouth daily.    silver sulfADIAZINE (SILVADENE) 1 % cream Apply 1 application topically daily. Qty: 50 g, Refills: 0       Follow-up Information   Follow up with Julian Hy, MD. Schedule an appointment as soon as possible for a visit in 1 week. (post hospitalization follow up)    Specialty:  Endocrinology   Contact  information:   [address] [place] [address] [phone]       TOTAL DISCHARGE TIME: 35 mins  [facility]  [facility] Pager [phone]  12/21/2013, 10:18 AM

## 2013-12-21 NOTE — Discharge Instructions (Signed)

## 2013-12-22 LAB — VITAMIN B1: Vitamin B1 (Thiamine): <7 nmol/L — ABNORMAL LOW (ref 8–30)

## 2013-12-28 ENCOUNTER — Encounter: Payer: Self-pay | Admitting: Podiatry

## 2013-12-28 ENCOUNTER — Ambulatory Visit (INDEPENDENT_AMBULATORY_CARE_PROVIDER_SITE_OTHER): Payer: Medicare Other | Admitting: Podiatry

## 2013-12-28 VITALS — BP 131/69 | HR 78 | Resp 16

## 2013-12-28 DIAGNOSIS — L97509 Non-pressure chronic ulcer of other part of unspecified foot with unspecified severity: Secondary | ICD-10-CM

## 2013-12-28 DIAGNOSIS — I6350 Cerebral infarction due to unspecified occlusion or stenosis of unspecified cerebral artery: Secondary | ICD-10-CM

## 2013-12-28 NOTE — Progress Notes (Signed)
Patient ID: Terrance JacquetCharles E Adams, male   DOB: 05/30/1921, 80 y.o.   MRN: [id number]  Subjective: Mr. Terrance PriceMyers turns the office they for followup evaluation of left foot ulceration pre-ulcerative lesion to heal. Patient presents with his brother. Patient's father states that his last appointment he has returned to rehabilitation facility after the patient was hospitalized recently. Stated that area was healing well and to the patient was wearing a shoe causing pressure to the area. Denies any redness or drainage from the area. Patient has not noticed any recent fevers, chills, nausea, vomiting. No other complaints at this time.  Objective: Awake, alert, NAD DP/PT pulses decreased Neurological status unchanged. Left lateral heel with approximately 0.8 x 0.8 cm superficial ulceration with central eschar over the area. Surrounding skin is intact and pink in color. No surrounding erythema, ascending cellulitis, fluctuance, crepitus, drainage. No tenderness to palpation over the area. No further skin lesions. No calf pain, swelling, warmth.  Assessment: 80 year old male with left lateral superficial ulceration surrounded by pre-ulcerative tissue.  Plan: -Various treatment options discussed including alternatives, risks, complications. -Continue with daily dressing changes with Silvadene. -Can strict offloading of the heel to prevent further skin breakdown.  -Monitor for any clinical signs or symptoms of infection and directed to call the office immediately if any are to occur or go directly to the emergency room. -Since patient is in rehabilitation facility and the wound is in the fat on a consistent basis can followup in 4 weeks or sooner if any palms are to arise or any changes symptoms. At the patient is discharged from the rehabilitation facility earlier to make a sooner appointment. In the meantime call any questions, concerns, change in symptoms.

## 2013-12-28 NOTE — Patient Instructions (Signed)
Continue with offloading all times. Continue with daily dressing changes. Monitor his skin breakdown. There is any progression call the office immediately. Monitor for any signs/symptoms of infection. Call the office immediately if any occur or go directly to the emergency room. Call with any questions/concerns.

## 2014-01-25 ENCOUNTER — Ambulatory Visit: Payer: Medicare Other | Admitting: Podiatry

## 2014-01-27 ENCOUNTER — Ambulatory Visit (INDEPENDENT_AMBULATORY_CARE_PROVIDER_SITE_OTHER): Payer: Medicare Other | Admitting: Podiatry

## 2014-01-27 ENCOUNTER — Encounter: Payer: Self-pay | Admitting: Podiatry

## 2014-01-27 VITALS — BP 108/56 | HR 72 | Resp 16

## 2014-01-27 DIAGNOSIS — I639 Cerebral infarction, unspecified: Secondary | ICD-10-CM

## 2014-01-27 DIAGNOSIS — L97521 Non-pressure chronic ulcer of other part of left foot limited to breakdown of skin: Secondary | ICD-10-CM

## 2014-01-27 NOTE — Patient Instructions (Signed)
Continue daily dressing changes to foot. Continue silvadene and a dressing daily. Call with any questions/concerns.  Monitor for any signs/symptoms of infection. Call the office immediately if any occur or go directly to the emergency room. Call with any questions/concerns.

## 2014-01-30 ENCOUNTER — Encounter: Payer: Self-pay | Admitting: Podiatry

## 2014-01-30 NOTE — Progress Notes (Signed)
Patient ID: Suezanne JacquetCharles E Sanguinetti, male   DOB: 02-24-1922, 80 y.o.   MRN: [id number]  Subjective: [name], 80 year old male, essentially opposite with his brother for follow-up evaluation of left foot heel ulceration. Patient's brother states that the patient is currently in a nursing facility. He continues to wear the multipodis boot and they state the wound is being dressed daily. No other complaints at this time.  Objective: Awake, alert, NAD DP/PT pulses decreased bilaterally.  Decreased protective sensation with SWMF bilaterally.  Left lateral heel ulceration measuring approximately 0.6 x 0.6 which is superficial in nature fibro-granular wound base. Mild hyperkerotic peri-wound. There is no surrounding erythema or drainage. No ascending cellulitis. No fluctuance, crepitus. No tunneling, undermining, probing. No other areas of ulceration or pre-ulcerative lesions identified bilaterally.  Pain, swelling, warmth, erythema.  Assessment: 80 year old male with left lateral heel ulceration  Plan: -Treatment options discussed including alternatives, risks, complications. -Wound sharply debrided of hyperkeratotic tissue without complications.  -Continue with daily dressing changes. Silvadene applied followed by dry sterile dressing. Continue to monitor for any clinical signs or symptoms of infection and directed to call the office immediately if any are to occur ago directly to the emergency room. -As the patient is currently in a nursing facility and the wound dressingsare being changed daily, follow-up in one month or sooner if any problems are to arise or any change in symptoms. Call with any questions, concerns.

## 2014-04-23 ENCOUNTER — Encounter (HOSPITAL_COMMUNITY): Payer: Self-pay

## 2014-04-23 ENCOUNTER — Emergency Department (HOSPITAL_COMMUNITY): Payer: Medicare Other

## 2014-04-23 ENCOUNTER — Inpatient Hospital Stay (HOSPITAL_COMMUNITY)
Admission: EM | Admit: 2014-04-23 | Discharge: 2014-04-28 | DRG: 152 | Disposition: A | Discharge status: Skilled Nursing Facility | Payer: Medicare Other | Attending: Internal Medicine | Admitting: Internal Medicine

## 2014-04-23 DIAGNOSIS — E860 Dehydration: Secondary | ICD-10-CM | POA: Diagnosis present

## 2014-04-23 DIAGNOSIS — G4700 Insomnia, unspecified: Secondary | ICD-10-CM | POA: Diagnosis present

## 2014-04-23 DIAGNOSIS — Z681 Body mass index (BMI) 19 or less, adult: Secondary | ICD-10-CM | POA: Diagnosis not present

## 2014-04-23 DIAGNOSIS — Z87440 Personal history of urinary (tract) infections: Secondary | ICD-10-CM | POA: Diagnosis not present

## 2014-04-23 DIAGNOSIS — R52 Pain, unspecified: Secondary | ICD-10-CM

## 2014-04-23 DIAGNOSIS — W050XXA Fall from non-moving wheelchair, initial encounter: Secondary | ICD-10-CM | POA: Diagnosis present

## 2014-04-23 DIAGNOSIS — H109 Unspecified conjunctivitis: Secondary | ICD-10-CM | POA: Diagnosis present

## 2014-04-23 DIAGNOSIS — R296 Repeated falls: Secondary | ICD-10-CM | POA: Diagnosis present

## 2014-04-23 DIAGNOSIS — G9341 Metabolic encephalopathy: Secondary | ICD-10-CM | POA: Diagnosis present

## 2014-04-23 DIAGNOSIS — J329 Chronic sinusitis, unspecified: Secondary | ICD-10-CM | POA: Diagnosis present

## 2014-04-23 DIAGNOSIS — E43 Unspecified severe protein-calorie malnutrition: Secondary | ICD-10-CM | POA: Diagnosis present

## 2014-04-23 DIAGNOSIS — F0390 Unspecified dementia without behavioral disturbance: Secondary | ICD-10-CM | POA: Diagnosis present

## 2014-04-23 DIAGNOSIS — R4182 Altered mental status, unspecified: Secondary | ICD-10-CM

## 2014-04-23 LAB — BASIC METABOLIC PANEL
Anion gap: 7 (ref 5–15)
BUN: 23 mg/dL (ref 6–23)
CO2: 28 mmol/L (ref 19–32)
Calcium: 8.9 mg/dL (ref 8.4–10.5)
Chloride: 103 mmol/L (ref 96–112)
Creatinine, Ser: 1.06 mg/dL (ref 0.50–1.35)
GFR calc Af Amer: 69 mL/min — ABNORMAL LOW (ref >90)
GFR calc non Af Amer: 60 mL/min — ABNORMAL LOW (ref >90)
Glucose, Bld: 171 mg/dL — ABNORMAL HIGH (ref 70–99)
Potassium: 3.8 mmol/L (ref 3.5–5.1)
Sodium: 138 mmol/L (ref 135–145)

## 2014-04-23 LAB — CBC WITH DIFFERENTIAL/PLATELET
Basophils Absolute: 0.1 10*3/uL (ref 0.0–0.1)
Basophils Relative: 1 % (ref 0–1)
Eosinophils Absolute: 0.4 10*3/uL (ref 0.0–0.7)
Eosinophils Relative: 4 % (ref 0–5)
HCT: 34.4 % — ABNORMAL LOW (ref 39.0–52.0)
Hemoglobin: 11.1 g/dL — ABNORMAL LOW (ref 13.0–17.0)
Lymphocytes Relative: 17 % (ref 12–46)
Lymphs Abs: 1.7 10*3/uL (ref 0.7–4.0)
MCH: 29.2 pg (ref 26.0–34.0)
MCHC: 32.3 g/dL (ref 30.0–36.0)
MCV: 90.5 fL (ref 78.0–100.0)
Monocytes Absolute: 1.3 10*3/uL — ABNORMAL HIGH (ref 0.1–1.0)
Monocytes Relative: 13 % — ABNORMAL HIGH (ref 3–12)
Neutro Abs: 6.3 10*3/uL (ref 1.7–7.7)
Neutrophils Relative %: 65 % (ref 43–77)
Platelets: 283 10*3/uL (ref 150–400)
RBC: 3.8 MIL/uL — ABNORMAL LOW (ref 4.22–5.81)
RDW: 15.2 % (ref 11.5–15.5)
WBC: 9.8 10*3/uL (ref 4.0–10.5)

## 2014-04-23 LAB — URINALYSIS, ROUTINE W REFLEX MICROSCOPIC
Bilirubin Urine: NEGATIVE
Glucose, UA: NEGATIVE mg/dL
Ketones, ur: NEGATIVE mg/dL
Leukocytes, UA: NEGATIVE
Nitrite: NEGATIVE
Protein, ur: NEGATIVE mg/dL
Specific Gravity, Urine: 1.016 (ref 1.005–1.030)
Urobilinogen, UA: 1 mg/dL (ref 0.0–1.0)
pH: 5.5 (ref 5.0–8.0)

## 2014-04-23 LAB — URINE MICROSCOPIC-ADD ON

## 2014-04-23 LAB — PHOSPHORUS: Phosphorus: 4 mg/dL (ref 2.3–4.6)

## 2014-04-23 LAB — MRSA PCR SCREENING: MRSA by PCR: NEGATIVE

## 2014-04-23 LAB — MAGNESIUM: Magnesium: 2.2 mg/dL (ref 1.5–2.5)

## 2014-04-23 MED ORDER — DOCUSATE SODIUM 100 MG PO CAPS
100.0000 mg | ORAL_CAPSULE | Freq: Two times a day (BID) | ORAL | Status: DC
  Administered 2014-04-24 – 2014-04-28 (×6): 100 mg via ORAL
  Filled 2014-04-23 (×11): qty 1

## 2014-04-23 MED ORDER — ENOXAPARIN SODIUM 40 MG/0.4ML ~~LOC~~ SOLN
40.0000 mg | SUBCUTANEOUS | Status: DC
  Administered 2014-04-23 – 2014-04-27 (×5): 40 mg via SUBCUTANEOUS
  Filled 2014-04-23 (×6): qty 0.4

## 2014-04-23 MED ORDER — SODIUM CHLORIDE 0.9 % IV SOLN
INTRAVENOUS | Status: DC
  Administered 2014-04-23 – 2014-04-28 (×5): via INTRAVENOUS

## 2014-04-23 MED ORDER — TRAZODONE HCL 50 MG PO TABS: 25.0000 mg | ORAL_TABLET | Freq: Every evening | ORAL | Status: DC | PRN

## 2014-04-23 MED ORDER — NITROFURANTOIN MACROCRYSTAL 50 MG PO CAPS
50.0000 mg | ORAL_CAPSULE | Freq: Every day | ORAL | Status: DC
  Administered 2014-04-24 – 2014-04-26 (×3): 50 mg via ORAL
  Filled 2014-04-23 (×5): qty 1

## 2014-04-23 MED ORDER — SODIUM CHLORIDE 0.9 % IV BOLUS (SEPSIS)
1000.0000 mL | Freq: Once | INTRAVENOUS | Status: AC
  Administered 2014-04-23: 1000 mL via INTRAVENOUS

## 2014-04-23 MED ORDER — CYANOCOBALAMIN 500 MCG PO TABS
500.0000 ug | ORAL_TABLET | Freq: Every day | ORAL | Status: DC
Start: 2014-04-24 — End: 2014-04-28
  Administered 2014-04-24 – 2014-04-28 (×4): 500 ug via ORAL
  Filled 2014-04-23 (×5): qty 1

## 2014-04-23 MED ORDER — ACETAMINOPHEN 325 MG PO TABS: 650.0000 mg | ORAL_TABLET | ORAL | Status: DC | PRN

## 2014-04-23 MED ORDER — ENSURE COMPLETE PO LIQD
237.0000 mL | Freq: Two times a day (BID) | ORAL | Status: DC
  Administered 2014-04-24: 237 mL via ORAL

## 2014-04-23 MED ORDER — CETYLPYRIDINIUM CHLORIDE 0.05 % MT LIQD
7.0000 mL | Freq: Two times a day (BID) | OROMUCOSAL | Status: DC
  Administered 2014-04-24 – 2014-04-27 (×8): 7 mL via OROMUCOSAL

## 2014-04-23 NOTE — ED Notes (Signed)
Attempted to call report to 3rd floor. RN unable to take report at this time.

## 2014-04-23 NOTE — ED Notes (Signed)
Bed: WA09 Expected date:  Expected time:  Means of arrival:  Comments: EMS 

## 2014-04-23 NOTE — ED Notes (Signed)
Per EMS, pt from assisted living.  Pt from [facility].  Per staff, pt has fallen x3 in 3 days.  More lethargic than normal.  Normally more awake.  Now, pt is lethargic.  Not ambulating well.  Staff states urine is foul smelling.  20 g Lt FA.  cbg 164, 125/68, hr 67, 98.6 temp GCS 14.

## 2014-04-23 NOTE — ED Provider Notes (Signed)
  Face-to-face evaluation   History: Reported fall from a wheelchair today at his assisted living facility.  He is usually fairly functional, either using a walker or wheelchair at his facility.  He is confused and out of it according to his brother, who is with him at this time.  Frequent falls in the past.  Physical exam: Debilitated appearing elderly man who is minimally responsive to stimulation and requests for answers to questions.  Normal tone, arms and legs bilaterally.  Mild right hip tenderness with passive range of motion.  Ribs nontender to palpation, no deformity.  Abdomen soft, nontender.  Assessment; debilitated patient with change in status with decreased responsiveness and fall from wheelchair.  Doubt serious head injury, serious bacterial infection or metabolic instability.  Maybe mildly dehydrated.  He'll need to be admitted for observation, and further evaluation to determine the cause for his altered mental status.  Medical screening examination/treatment/procedure(s) were conducted as a shared visit with non-physician practitioner(s) and myself.  I personally evaluated the patient during the encounter  Flint MelterElliott L Kahle Mcqueen, MD 04/23/14 1726
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
## 2014-04-23 NOTE — ED Notes (Signed)
Attempted to call report to floor, RN states she will call back.

## 2014-04-23 NOTE — ED Notes (Signed)
Attempted to call report.  Per RN, notifying AC d/t high acuity patients.  Told to give report to oncoming RN and they will get back to us.

## 2014-04-23 NOTE — H&P (Signed)
Terrance Adams is an 81 y.o. male.   Chief Complaint: altered mental status  HPI: Mr. Terrance Adams is a 81 yo gentleman.  He lives at an assisted living.  His brother checks on him regularly. Apparently he has had 3 falls in the last  Few days.  Today he fell out of his wheelchair. He presented to the ER via EMS.  No apparent injury is seen and ER eval is negative for infection . However, he is not a baseline in terms of mental status. He is very somnolent.  He is very weak.  Due to his altered mental status he will be admitted. He does have some crusting around his eyes which could be consistent with conjunctivitis.   Past Medical History  Diagnosis Date  . UTI (urinary tract infection)     HX OF UTI  History of L heel ulcer Deconditioning insomnia  Past Surgical History  Procedure Laterality Date  . Hernia repair      History reviewed. No pertinent family history. Social History:  reports that he has never smoked. He has never used smokeless tobacco. He reports that he does not drink alcohol or use illicit drugs. 6th grade education.    Allergies: No Known Allergies   Home meds;  Senna-docusate Silvadene b12 500 mcg daily Trazodone 50 mg qhs Furosemide 20 mg one qam Tramadol 50 mg one prn  Results for orders placed or performed during the hospital encounter of 04/23/14 (from the past 48 hour(s))  CBC with Differential     Status: Abnormal   Collection Time: 04/23/14  2:41 PM  Result Value Ref Range   WBC 9.8 4.0 - 10.5 K/uL   RBC 3.80 (L) 4.22 - 5.81 MIL/uL   Hemoglobin 11.1 (L) 13.0 - 17.0 g/dL   HCT 34.4 (L) 39.0 - 52.0 %   MCV 90.5 78.0 - 100.0 fL   MCH 29.2 26.0 - 34.0 pg   MCHC 32.3 30.0 - 36.0 g/dL   RDW 15.2 11.5 - 15.5 %   Platelets 283 150 - 400 K/uL   Neutrophils Relative % 65 43 - 77 %   Neutro Abs 6.3 1.7 - 7.7 K/uL   Lymphocytes Relative 17 12 - 46 %   Lymphs Abs 1.7 0.7 - 4.0 K/uL   Monocytes Relative 13 (H) 3 - 12 %   Monocytes Absolute 1.3 (H) 0.1 -  1.0 K/uL   Eosinophils Relative 4 0 - 5 %   Eosinophils Absolute 0.4 0.0 - 0.7 K/uL   Basophils Relative 1 0 - 1 %   Basophils Absolute 0.1 0.0 - 0.1 K/uL  Basic metabolic panel     Status: Abnormal   Collection Time: 04/23/14  2:41 PM  Result Value Ref Range   Sodium 138 135 - 145 mmol/L   Potassium 3.8 3.5 - 5.1 mmol/L   Chloride 103 96 - 112 mmol/L   CO2 28 19 - 32 mmol/L   Glucose, Bld 171 (H) 70 - 99 mg/dL   BUN 23 6 - 23 mg/dL   Creatinine, Ser 1.06 0.50 - 1.35 mg/dL   Calcium 8.9 8.4 - 10.5 mg/dL   GFR calc non Af Amer 60 (L) >90 mL/min   GFR calc Af Amer 69 (L) >90 mL/min    Comment: (NOTE) The eGFR has been calculated using the CKD EPI equation. This calculation has not been validated in all clinical situations. eGFR's persistently <90 mL/min signify possible Chronic Kidney Disease.    Anion gap 7 5 -  15  Urinalysis, Routine w reflex microscopic     Status: Abnormal   Collection Time: 04/23/14  3:34 PM  Result Value Ref Range   Color, Urine YELLOW YELLOW   APPearance CLOUDY (A) CLEAR   Specific Gravity, Urine 1.016 1.005 - 1.030   pH 5.5 5.0 - 8.0   Glucose, UA NEGATIVE NEGATIVE mg/dL   Hgb urine dipstick SMALL (A) NEGATIVE   Bilirubin Urine NEGATIVE NEGATIVE   Ketones, ur NEGATIVE NEGATIVE mg/dL   Protein, ur NEGATIVE NEGATIVE mg/dL   Urobilinogen, UA 1.0 0.0 - 1.0 mg/dL   Nitrite NEGATIVE NEGATIVE   Leukocytes, UA NEGATIVE NEGATIVE  Urine microscopic-add on     Status: None   Collection Time: 04/23/14  3:34 PM  Result Value Ref Range   WBC, UA 0-2 <3 WBC/hpf   RBC / HPF 0-2 <3 RBC/hpf   Dg Chest 2 View  04/23/2014   CLINICAL DATA:  Three falls in the last 3 days. Lethargy. Poor ambulation.  EXAM: CHEST  2 VIEW  COMPARISON:  Multiple exams, including 12/18/2013  FINDINGS: Elevated left hemidiaphragm. Tortuosity and atherosclerotic calcification of the aortic arch. Mild enlargement of the cardiopericardial silhouette. Faint Kerley B-lines at the right lung  base.  Eliminated acromion humeral distance on the right. Bony demineralization. Old healed right-sided rib fractures.  Continued bandlike opacities along both hemidiaphragms, likely from scarring or atelectasis. Vague density over the left chest is probably a skin fold.  Thoracolumbar spondylosis with poor definition of the vertebral bodies on the lateral projection due to motion.  IMPRESSION: 1. Mild cardiomegaly with faint Kerley B-lines which could reflect low-level interstitial edema. 2. Mild bibasilar atelectasis or scarring. Elevated left hemidiaphragm. 3. Atherosclerosis. 4. Suspected chronic rotator cuff tear on the right.   Electronically Signed   By: Sherryl Barters M.D.   On: 04/23/2014 16:32   Dg Pelvis 1-2 Views  04/23/2014   CLINICAL DATA:  Multiple recent falls over the last 3 days. Lethargy. Poor ambulation. Initial encounter.  EXAM: PELVIS - 1-2 VIEW  COMPARISON:  None.  FINDINGS: The bones are adequately mineralized for age. There is no evidence of acute fracture or dislocation. There are mild symmetric degenerative changes of the hips and sacroiliac joints. There are greater degenerative changes in the lower lumbar spine associated with a convex right scoliosis.  IMPRESSION: No evidence of acute pelvic fracture or subluxation.   Electronically Signed   By: Camie Patience M.D.   On: 04/23/2014 16:31   Ct Head Wo Contrast  04/23/2014   CLINICAL DATA:  Recent falls, increase lethargy  EXAM: CT HEAD WITHOUT CONTRAST  TECHNIQUE: Contiguous axial images were obtained from the base of the skull through the vertex without intravenous contrast.  COMPARISON:  12/17/2013  FINDINGS: Bony calvarium is intact. Chronic changes are noted throughout the paranasal sinuses. Diffuse atrophic changes and chronic white matter ischemic change is again identified. No findings to suggest acute hemorrhage, acute infarction or space-occupying mass lesion are seen. Basal ganglia calcifications are noted.  IMPRESSION:  Chronic atrophic and ischemic changes are noted. No acute abnormality is noted.   Electronically Signed   By: Inez Catalina M.D.   On: 04/23/2014 15:20    ROS:as per hpi.  He cannot relate any details. His family is not present  Blood pressure 150/78, pulse 71, temperature 98.1 F (36.7 C), temperature source Rectal, resp. rate 12, SpO2 97 %.  age appropriate male, supine on stretcher.  He has no pallor, no icterus, he has  crusts around the eyes.   lungs are cta bilat. no w/r/r/  heart is rrr no m/r/g. abd soft nt, nd, no mass or hsm. no edema. he has a dry crust on left heel but no ulcers on either feet that are active or open and no signs of cellulitis.  he moves extremities times four. he does answer simple questions but he is not able to relate recent events.  he thinks it is 1958.  he is not aware of his circumstances.  Assessment/Plan 81 yo gentleman with frequent falls and altered mental status.  He may have underlying dementia with worsening status or delirium.  No clear source of infection is seen. We will admit and very gently hydrated.  He may need mri brain if not clearing mentally by the a.m..  We will ask for P.T. Eval.  He may need SNF level care if he doesn't improve.  He if full code currently according to our office chart directives section.  Jerlyn Ly, MD 04/23/2014, 6:17 PM

## 2014-04-23 NOTE — ED Provider Notes (Signed)
3:57 PM Patient signed out to me by Mirian Capuchin'Malley, PA-C.  Patient is from assisted living, but has had 3 falls in the past 3 days.  Family member states that the patient is significantly altered from baseline.  Patient seen by and discussed with Dr. Effie ShyWentz, who recommends admission for new AMS.  [facility].  Roxy Horsemanobert Yostin Malacara, PA-C 04/23/14 1735  Flint MelterElliott L Wentz, MD 04/24/14 [phone]

## 2014-04-24 ENCOUNTER — Observation Stay (HOSPITAL_COMMUNITY): Payer: Medicare Other

## 2014-04-24 DIAGNOSIS — R4182 Altered mental status, unspecified: Secondary | ICD-10-CM | POA: Diagnosis present

## 2014-04-24 LAB — COMPREHENSIVE METABOLIC PANEL
ALBUMIN: 2.9 g/dL — AB (ref 3.5–5.2)
ALK PHOS: 59 U/L (ref 39–117)
ALT: 16 U/L (ref 0–53)
ANION GAP: 9 (ref 5–15)
AST: 21 U/L (ref 0–37)
BUN: 19 mg/dL (ref 6–23)
CALCIUM: 8.2 mg/dL — AB (ref 8.4–10.5)
CHLORIDE: 107 mmol/L (ref 96–112)
CO2: 25 mmol/L (ref 19–32)
Creatinine, Ser: 0.79 mg/dL (ref 0.50–1.35)
GFR, EST AFRICAN AMERICAN: 89 mL/min — AB (ref >90)
GFR, EST NON AFRICAN AMERICAN: 77 mL/min — AB (ref >90)
Glucose, Bld: 92 mg/dL (ref 70–99)
POTASSIUM: 3.8 mmol/L (ref 3.5–5.1)
Sodium: 141 mmol/L (ref 135–145)
Total Bilirubin: 0.3 mg/dL (ref 0.3–1.2)
Total Protein: 5.6 g/dL — ABNORMAL LOW (ref 6.0–8.3)

## 2014-04-24 LAB — CBC WITH DIFFERENTIAL/PLATELET
Basophils Absolute: 0.1 10*3/uL (ref 0.0–0.1)
Basophils Relative: 1 % (ref 0–1)
Eosinophils Absolute: 0.6 10*3/uL (ref 0.0–0.7)
Eosinophils Relative: 6 % — ABNORMAL HIGH (ref 0–5)
HCT: 33 % — ABNORMAL LOW (ref 39.0–52.0)
HEMOGLOBIN: 10.5 g/dL — AB (ref 13.0–17.0)
Lymphocytes Relative: 16 % (ref 12–46)
Lymphs Abs: 1.5 10*3/uL (ref 0.7–4.0)
MCH: 28.6 pg (ref 26.0–34.0)
MCHC: 31.8 g/dL (ref 30.0–36.0)
MCV: 89.9 fL (ref 78.0–100.0)
MONOS PCT: 13 % — AB (ref 3–12)
Monocytes Absolute: 1.1 10*3/uL — ABNORMAL HIGH (ref 0.1–1.0)
Neutro Abs: 5.9 10*3/uL (ref 1.7–7.7)
Neutrophils Relative %: 64 % (ref 43–77)
Platelets: 259 10*3/uL (ref 150–400)
RBC: 3.67 MIL/uL — ABNORMAL LOW (ref 4.22–5.81)
RDW: 15 % (ref 11.5–15.5)
WBC: 9.2 10*3/uL (ref 4.0–10.5)

## 2014-04-24 LAB — URINE CULTURE: Colony Count: 15000

## 2014-04-24 LAB — AMMONIA: AMMONIA: 19 umol/L (ref 11–32)

## 2014-04-24 LAB — TSH: TSH: 1.082 u[IU]/mL (ref 0.350–4.500)

## 2014-04-24 LAB — VITAMIN B12: Vitamin B-12: 914 pg/mL — ABNORMAL HIGH (ref 211–911)

## 2014-04-24 MED ORDER — FERROUS SULFATE 325 (65 FE) MG PO TABS
325.0000 mg | ORAL_TABLET | Freq: Every day | ORAL | Status: DC
  Administered 2014-04-24 – 2014-04-27 (×4): 325 mg via ORAL
  Filled 2014-04-24 (×5): qty 1

## 2014-04-24 MED ORDER — LORAZEPAM 2 MG/ML IJ SOLN: 0.5000 mg | Freq: Once | INTRAMUSCULAR | Status: AC | PRN

## 2014-04-24 MED ORDER — GADOBENATE DIMEGLUMINE 529 MG/ML IV SOLN
10.0000 mL | Freq: Once | INTRAVENOUS | Status: AC | PRN
  Administered 2014-04-24: 10 mL via INTRAVENOUS

## 2014-04-24 MED ORDER — ENSURE COMPLETE PO LIQD
237.0000 mL | Freq: Three times a day (TID) | ORAL | Status: DC
  Administered 2014-04-25 – 2014-04-28 (×10): 237 mL via ORAL

## 2014-04-24 NOTE — Evaluation (Signed)
Physical Therapy Evaluation Patient Details Name: Terrance Adams MRN: [id number] DOB: Aug 21, 1921 Today's Date: 04/24/2014   History of Present Illness  Pt admitted from ALF after having 3 falls in last several days including 1 from his w/c. Pt admitted with AMS and possible conjuctivitis.  Clinical Impression  Pt admitted with above diagnosis. Pt currently with functional limitations due to the deficits listed below (see PT Problem List).  Pt will benefit from skilled PT to increase their independence and safety with mobility to allow discharge to the venue listed below.       Follow Up Recommendations SNF    Equipment Recommendations  None recommended by PT    Recommendations for Other Services       Precautions / Restrictions Precautions Precautions: Fall Precaution Comments: 3 falls in several days prior to admission with last fall being from his w/c      Mobility  Bed Mobility Overal bed mobility: Needs Assistance Bed Mobility: Supine to Sit;Sit to Supine     Supine to sit: Max assist Sit to supine: Mod assist   General bed mobility comments: A to get trunk uprigh at EOB and MOD A for legs when returning supine.  Pt keeping eyes closed entire transfer.  Transfers                    Ambulation/Gait                Stairs            Wheelchair Mobility    Modified Rankin (Stroke Patients Only)       Balance Overall balance assessment: Needs assistance Sitting-balance support: Bilateral upper extremity supported;Feet supported Sitting balance-Leahy Scale: Poor Sitting balance - Comments: Pt sat EOB and wiped face with warm wash cloth.  Constantly asking to lie back down. "I'm an old man."                                     Pertinent Vitals/Pain Pain Assessment: Faces Faces Pain Scale: Hurts little more Pain Location: neck Pain Intervention(s): Limited activity within patient's tolerance    Home Living  Family/patient expects to be discharged to:: Skilled nursing facility                 Additional Comments: Pt had been at ALF.      Prior Function Level of Independence: Needs assistance   Gait / Transfers Assistance Needed: per shart he fell out of w/c at ALF.  Pt unable to give PT any information           Hand Dominance        Extremity/Trunk Assessment   Upper Extremity Assessment: Defer to OT evaluation           Lower Extremity Assessment: Generalized weakness (tight hamstrings B'ly)      Cervical / Trunk Assessment: Kyphotic  Communication   Communication: HOH  Cognition Arousal/Alertness: Lethargic   Overall Cognitive Status: No family/caregiver present to determine baseline cognitive functioning                      General Comments General comments (skin integrity, edema, etc.): Pt lethargic throughout session.    Exercises        Assessment/Plan    PT Assessment Patient needs continued PT services  PT Diagnosis Generalized weakness   PT Problem List Decreased strength;Decreased balance;Decreased  activity tolerance;Decreased mobility;Decreased cognition  PT Treatment Interventions Functional mobility training;Therapeutic activities;Therapeutic exercise;DME instruction;Balance training   PT Goals (Current goals can be found in the Care Plan section) Acute Rehab PT Goals Patient Stated Goal: none stated Time For Goal Achievement: 05/08/14 Potential to Achieve Goals: Good    Frequency Min 3X/week   Barriers to discharge        Co-evaluation               End of Session   Activity Tolerance: Patient limited by lethargy Patient left: in bed;with call bell/phone within reach;with bed alarm set Nurse Communication: Mobility status         Time: 1610-9604 PT Time Calculation (min) (ACUTE ONLY): 16 min   Charges:   PT Evaluation $Initial PT Evaluation Tier I: 1 Procedure     PT G Codes:        Terrance Nielsen  Adams 04/24/2014, 10:17 AM

## 2014-04-24 NOTE — Progress Notes (Signed)
Clinical Social Work Department BRIEF PSYCHOSOCIAL ASSESSMENT 04/24/2014  Patient:  DERIC, BOCOCK     Account Number:  [id number]     Admit date:  04/23/2014  Clinical Social Worker:  Maryln Manuel  Date/Time:  04/24/2014 03:30 PM  Referred by:  Physician  Date Referred:  04/24/2014 Referred for  ALF Placement   Other Referral:   Interview type:  Family Other interview type:    PSYCHOSOCIAL DATA Living Status:  FACILITY Admitted from facility:   Level of care:   Primary support name:  Broadus John Dutta/brother/[phone] Primary support relationship to patient:  SIBLING Degree of support available:   strong    CURRENT CONCERNS Current Concerns  Post-Acute Placement   Other Concerns:    SOCIAL WORK ASSESSMENT / PLAN CSW received referral that pt admitted from [place] and pt currently observation status and pt Medicare insurance. PT recommending SNF.    CSW met with pt brother at bedside. CSW introduced self and explained role. Pt brother confirmed that pt admitted from [facility]. Per PT eval, recommendation for SNF. CSW discussed this with pt brother. Pt brother stated the he preferred for pt to return to [facility] upon discharge. Pt brother stated that he is privately paying for ALF and does not want to change pt enviornment. Pt brother stated that he plans to go to pt ALF first thing in the morning to speak with RN at ALF.    CSW contacted [facility]. Per [facility], RN from facility will have to assess pt. Per ALF, RN is not in today. CSW notified ALF that pt is only observation and it will be pertinent that ALF RN comes to assess pt in the morning. Med Tech at ALF states that she will give message to RN.    CSW to follow up with [facility] tomorrow morning regarding pt returning to facility when medically stable for discharge.    CSW to continue to follow.   Assessment/plan status:  Psychosocial Support/Ongoing Assessment of  Needs Other assessment/ plan:   discharge planning   Information/referral to community resources:   Referral back to [facility]F; pt brother declining SNF at this time.    PATIENT'S/FAMILY'S RESPONSE TO PLAN OF CARE: Pt oriented to person only. Pt brother feels that pt will do best with returning to [facility]F with home health services. Pt brother supportive and actively involved in pt care.    Alison Murray, MSW, [place] [facility] [phone]

## 2014-04-24 NOTE — Care Management Note (Signed)
CARE MANAGEMENT NOTE 04/24/2014  Patient:  Terrance Adams,Terrance Adams   Account Number:  [id number]  Date Initiated:  04/24/2014  Documentation initiated by:  Sandford CrazeLEMENTS,Durant Scibilia  Subjective/Objective Assessment:   81 yo admitted with altered mental status     Action/Plan:   From ALF   Anticipated DC Date:  04/25/2014   Anticipated DC Plan:  SKILLED NURSING FACILITY  In-house referral  Clinical Social Worker      DC Planning Services  CM consult      Choice offered to / List presented to:             Status of service:  In process, will continue to follow Medicare Important Message given?   (If response is "NO", the following Medicare IM given date fields will be blank) Date Medicare IM given:   Medicare IM given by:   Date Additional Medicare IM given:   Additional Medicare IM given by:    Discharge Disposition:    Per UR Regulation:  Reviewed for med. necessity/level of care/duration of stay  If discussed at Long Length of Stay Meetings, dates discussed:    Comments:  Sandford Crazeora Gervase Colberg RN,BSN,NCM

## 2014-04-24 NOTE — Progress Notes (Signed)
INITIAL NUTRITION ASSESSMENT  DOCUMENTATION CODES Per approved criteria  -Severe malnutrition in the context of chronic illness  Pt meets criteria for severe MALNUTRITION in the context of chronic illness as evidenced by moderate to severe fat and muscle wasting.  INTERVENTION: - Ensure Complete po TID, each supplement provides 350 kcal and 13 grams of protein - Pt needs feeding assist - RD will continue to monitor  NUTRITION DIAGNOSIS: Inadequate oral intake related to altered mental status as evidenced by reported poor po intake.   Goal: Pt to meet >/= 90% of their estimated nutrition needs   Monitor:  Weight trend, po intake, acceptance of supplements, labs  Reason for Assessment: Consult for nutrition assessment, Malnutrition Screening Tool  81 y.o. male  Admitting Dx: <principal problem not specified>  ASSESSMENT: 81 yo gentleman. He lives at an assisted living. His brother checks on him regularly. Apparently he has had 3 falls in the lastfew days.Today he fell out of his wheelchair. He presented to the ER via EMS. No apparent injury is seen and ER eval is negative for infection . However, he is not a baseline in terms of mental status. He is very somnolent. He is very weak. Due to his altered mental status he will be admitted. He does have some crusting around his eyes which could be consistent with conjunctivitis.   - Per RN, pt ate ~50% of his lunch today. He was fed by nurse tech.  - Per son, pt feeds himself at baseline, and was doing so up until hospital admission. Pt not will not feed self at all.  - Pt was not on nutritional supplements at ALF. Son agreeable to try Ensure Complete to improve nutritional status.  Nutrition Focused Physical Exam:  Subcutaneous Fat:  Orbital Region: moderate depletion Upper Arm Region: mild to moderate depeletion Thoracic and Lumbar Region: n/a  Muscle:  Temple Region: moderate to severe depletion Clavicle Bone Region:  moderate depletion Clavicle and Acromion Bone Region: moderate depletion Scapular Bone Region: n/a Dorsal Hand: mild to moderate depletion Patellar Region: moderate depletion Anterior Thigh Region: mild to moderate depletion Posterior Calf Region: mild to moderate depletion  Edema: none  Height: Ht Readings from Last 1 Encounters:  04/24/14  (1.753 m)    Weight: Wt Readings from Last 1 Encounters:  04/23/14 123 lb 14.4 oz (56.2 kg)    Ideal Body Weight: 70.7 kg  % Ideal Body Weight: 79%  Wt Readings from Last 10 Encounters:  04/23/14 123 lb 14.4 oz (56.2 kg)  12/17/13 124 lb (56.246 kg)  11/17/13 124 lb 1.6 oz (56.291 kg)  10/12/13 137 lb 8 oz (62.37 kg)  03/29/11 114 lb 10.2 oz (52 kg)  03/15/11 124 lb (56.246 kg)  01/04/10 132 lb (59.875 kg)   BMI:  Body mass index is 18.29 kg/(m^2).  Estimated Nutritional Needs: Kcal: 1500-1700 Protein: 75-85 g Fluid: 1.7 L/day  Skin: intact  Diet Order: Diet Heart  EDUCATION NEEDS: -Education needs addressed   Intake/Output Summary (Last 24 hours) at 04/24/14 1458 Last data filed at 04/24/14 1417  Gross per 24 hour  Intake 407.08 ml  Output    300 ml  Net 107.08 ml    Last BM: prior to admission   Labs:   Recent Labs Lab 04/23/14 1441 04/24/14 0444  NA 138 141  K 3.8 3.8  CL 103 107  CO2 28 25  BUN 23 19  CREATININE 1.06 0.79  CALCIUM 8.9 8.2*  MG 2.2  --  PHOS 4.0  --   GLUCOSE 171* 92    CBG (last 3)  No results for input(s): GLUCAP in the last 72 hours.  Scheduled Meds: . antiseptic oral rinse  7 mL Mouth Rinse BID  . cyanocobalamin  500 mcg Oral Daily  . docusate sodium  100 mg Oral BID  . enoxaparin (LOVENOX) injection  40 mg Subcutaneous Q24H  . feeding supplement (ENSURE COMPLETE)  237 mL Oral BID BM  . ferrous sulfate  325 mg Oral QHS  . nitrofurantoin  50 mg Oral QHS    Continuous Infusions: . sodium chloride 25 mL/hr at 04/23/14 2319    Past Medical History  Diagnosis  Date  . UTI (urinary tract infection)     HX OF UTI    Past Surgical History  Procedure Laterality Date  . Hernia repair      Emmaline KluverHaley Pina Sirianni MS, RD, LDN

## 2014-04-24 NOTE — Progress Notes (Addendum)
Physician Daily Progress Note  Subjective: No events overnight  Objective: Vital signs in last 24 hours:   Filed Vitals:   04/23/14 2115 04/23/14 2135 04/24/14 0545 04/24/14 0555  BP:  155/65 179/84 150/76  Pulse:  63 72   Temp:  98.5 F (36.9 C) 97.9 F (36.6 C)   TempSrc:  Axillary Axillary   Resp:  16 16   Weight: 123 lb 14.4 oz (56.2 kg)     SpO2:  98% 99%    Weight change:     CBG (last 3)  No results for input(s): GLUCAP in the last 72 hours.  Intake/Output from previous day:  Intake/Output Summary (Last 24 hours) at 04/24/14 0642 Last data filed at 04/24/14 0600  Gross per 24 hour  Intake 167.08 ml  Output    300 ml  Net -132.92 ml      Physical Exam General appearance: WM in NAD , still groggy this AM HEENT:  MMM, anicteric  Resp:   CTAB, no wr Cardio:  RRR< no mrg  GI: soft, non-tender; bowel sounds normal; no masses,  no organomegaly Extremities: no clubbing, cyanosis or edema   Labs: Basic Metabolic Panel:  Recent Labs Lab 04/23/14 1441 04/24/14 0444  NA 138 141  K 3.8 3.8  CL 103 107  CO2 28 25  GLUCOSE 171* 92  BUN 23 19  CREATININE 1.06 0.79  CALCIUM 8.9 8.2*  MG 2.2  --   PHOS 4.0  --    GFR Estimated Creatinine Clearance: 48.8 mL/min (by C-G formula based on Cr of 0.79). Liver Function Tests:  Recent Labs Lab 04/24/14 0444  AST 21  ALT 16  ALKPHOS 59  BILITOT 0.3  PROT 5.6*  ALBUMIN 2.9*   No results for input(s): LIPASE, AMYLASE in the last 168 hours. No results for input(s): AMMONIA in the last 168 hours. Coagulation profile No results for input(s): INR, PROTIME in the last 168 hours.  CBC:  Recent Labs Lab 04/23/14 1441 04/24/14 0444  WBC 9.8 9.2  NEUTROABS 6.3 5.9  HGB 11.1* 10.5*  HCT 34.4* 33.0*  MCV 90.5 89.9  PLT 283 259   Cardiac Enzymes: No results for input(s): CKTOTAL, CKMB, CKMBINDEX, TROPONINI in the last 168 hours. BNP (last 3 results) No results for input(s): PROBNP in the last 8760  hours. CBG: No results for input(s): GLUCAP in the last 168 hours. D-Dimer: No results for input(s): DDIMER in the last 72 hours. Hgb A1c: No results for input(s): HGBA1C in the last 72 hours. Lipid Profile: No results for input(s): CHOL, HDL, LDLCALC, TRIG, CHOLHDL, LDLDIRECT in the last 72 hours. Thyroid function studies: No results for input(s): TSH, T4TOTAL, T3FREE, THYROIDAB in the last 72 hours.  Invalid input(s): FREET3 Anemia work up: No results for input(s): VITAMINB12, FOLATE, FERRITIN, TIBC, IRON, RETICCTPCT in the last 72 hours. Sepsis Labs:  Recent Labs Lab 04/23/14 1441 04/24/14 0444  WBC 9.8 9.2   Microbiology Recent Results (from the past 240 hour(s))  MRSA PCR Screening     Status: None   Collection Time: 04/23/14  9:51 PM  Result Value Ref Range Status   MRSA by PCR NEGATIVE NEGATIVE Final    Comment:        The GeneXpert MRSA Assay (FDA approved for NASAL specimens only), is one component of a comprehensive MRSA colonization surveillance program. It is not intended to diagnose MRSA infection nor to guide or monitor treatment for MRSA infections.      Marland Kitchen antiseptic  oral rinse  7 mL Mouth Rinse BID  . cyanocobalamin  500 mcg Oral Daily  . docusate sodium  100 mg Oral BID  . enoxaparin (LOVENOX) injection  40 mg Subcutaneous Q24H  . feeding supplement (ENSURE COMPLETE)  237 mL Oral BID BM  . ferrous sulfate  325 mg Oral QHS  . nitrofurantoin  50 mg Oral QHS   Continuous Infusions: . sodium chloride 25 mL/hr at 04/23/14 2319     Assessment/Plan:  AMS - CT showed only chronic change. Labs and vitals ok. Checking TSH, ammonia, MRI brain,B12 today. Holding all potentially mood altering drugs. Discharged in sept 2015 for metabolic encephalopathy 2/2 ? UTI , so now recurring. Has been in SNF several times in past year for deconditioning.   Protein calorie malnutrition - protein shakes. Nutrition consult  B12 def - on PO supplements . Checking  levels   Iron def - on supplements. Hgb daily   PPx - lovenox   Full CODE   Dispo - placing SW , PT , OT c/s b/c of possible need to go to SNF at d/c b/c of increasing falls     LOS: 1 day   Terrance Adams 04/24/2014, 6:42 AM  I , or a covering physician , can be reached for questions at [phone].

## 2014-04-24 NOTE — Evaluation (Signed)
Occupational Therapy Evaluation Patient Details Name: BRAXTON VANTREASE MRN: [id number] DOB: 1921/11/28 Today's Date: 04/24/2014    History of Present Illness Pt admitted from ALF after having 3 falls in last several days including 1 from his w/c. Pt admitted with AMS and possible conjuctivitis.   Clinical Impression   Pt admitted after 3 falls. Pt currently with functional limitations due to the deficits listed below (see OT Problem List).  Pt will benefit from skilled OT to increase their safety and independence with ADL and functional mobility for ADL to facilitate discharge to venue listed below.      Follow Up Recommendations  SNF    Equipment Recommendations  None recommended by OT    Recommendations for Other Services       Precautions / Restrictions Precautions Precautions: Fall Precaution Comments: 3 falls in several days prior to admission with last fall being from his w/c      Mobility Bed Mobility Overal bed mobility: Needs Assistance Bed Mobility: Supine to Sit;Sit to Supine     Supine to sit: Max assist Sit to supine: Mod assist;Max assist   General bed mobility comments: Pt did open eyes briefly during OT session several times. OT did find condom cath off and at bottom of the bed.  Did relay this to RN  Transfers                 General transfer comment: NT    Balance Overall balance assessment: Needs assistance Sitting-balance support: Bilateral upper extremity supported;Feet supported Sitting balance-Leahy Scale: Poor Sitting balance - Comments: Pt sat EOB and wiped face with warm wash cloth.  Constantly asking to lie back down. "I'm an old man."                                    ADL Overall ADL's : Needs assistance/impaired     Grooming: Wash/dry face;Bed level;Maximal assistance                                       Vision                     Perception     Praxis      Pertinent  Vitals/Pain Pain Assessment: Faces Faces Pain Scale: Hurts little more Pain Location: neck Pain Intervention(s): Monitored during session;Limited activity within patient's tolerance     Hand Dominance     Extremity/Trunk Assessment Upper Extremity Assessment Upper Extremity Assessment: Generalized weakness;Difficult to assess due to impaired cognition   Lower Extremity Assessment Lower Extremity Assessment: Generalized weakness (tight hamstrings B'ly)   Cervical / Trunk Assessment Cervical / Trunk Assessment: Kyphotic   Communication Communication Communication: HOH   Cognition Arousal/Alertness: Lethargic   Overall Cognitive Status: No family/caregiver present to determine baseline cognitive functioning                     General Comments       Exercises       Shoulder Instructions      Home Living Family/patient expects to be discharged to:: Skilled nursing facility                                 Additional Comments: Pt had been  at ALF.        Prior Functioning/Environment Level of Independence: Needs assistance  Gait / Transfers Assistance Needed: per shart he fell out of w/c at ALF.  Pt unable to give PT any information          OT Diagnosis: Generalized weakness;Cognitive deficits;Altered mental status   OT Problem List: Decreased strength;Decreased activity tolerance;Impaired balance (sitting and/or standing)   OT Treatment/Interventions: Self-care/ADL training;Patient/family education;DME and/or AE instruction    OT Goals(Current goals can be found in the care plan section) Acute Rehab OT Goals Patient Stated Goal: none stated OT Goal Formulation: With patient Time For Goal Achievement: 05/01/14 Potential to Achieve Goals: Fair  OT Frequency: Min 2X/week   Barriers to D/C:            Co-evaluation              End of Session Nurse Communication: Mobility status;Other (comment) (condom cath off)  Activity  Tolerance: Patient limited by fatigue Patient left: in bed;with call bell/phone within reach   Time: 1223-1235 OT Time Calculation (min): 12 min Charges:  OT General Charges $OT Visit: 1 Procedure OT Evaluation $Initial OT Evaluation Tier I: 1 Procedure G-Codes:    Einar CrowEDDING, Romaine Neville D 04/24/2014, 1:00 PM

## 2014-04-24 NOTE — Evaluation (Signed)
SLP Cancellation Note  Patient Details Name: Suezanne JacquetCharles E Crotty MRN: [id number] DOB: 05/18/1921   Cancelled treatment:       Reason Eval/Treat Not Completed:  (spoke to OT who reported pt opened eyes briefly a few times during OT session, will reattempt when more alert)   Donavan Burnetamara Jacari Kirsten, MS [facility] SLP [phone]

## 2014-04-25 LAB — LIPID PANEL
CHOLESTEROL: 180 mg/dL (ref 0–200)
HDL: 57 mg/dL (ref >39)
LDL CALC: 107 mg/dL — AB (ref 0–99)
Total CHOL/HDL Ratio: 3.2 RATIO
Triglycerides: 79 mg/dL (ref <150)
VLDL: 16 mg/dL (ref 0–40)

## 2014-04-25 LAB — CBC WITH DIFFERENTIAL/PLATELET
BASOS ABS: 0.1 10*3/uL (ref 0.0–0.1)
BASOS PCT: 1 % (ref 0–1)
Eosinophils Absolute: 0.4 10*3/uL (ref 0.0–0.7)
Eosinophils Relative: 4 % (ref 0–5)
HCT: 36.3 % — ABNORMAL LOW (ref 39.0–52.0)
HEMOGLOBIN: 11.8 g/dL — AB (ref 13.0–17.0)
LYMPHS ABS: 1.5 10*3/uL (ref 0.7–4.0)
Lymphocytes Relative: 14 % (ref 12–46)
MCH: 29.1 pg (ref 26.0–34.0)
MCHC: 32.5 g/dL (ref 30.0–36.0)
MCV: 89.4 fL (ref 78.0–100.0)
MONOS PCT: 13 % — AB (ref 3–12)
Monocytes Absolute: 1.4 10*3/uL — ABNORMAL HIGH (ref 0.1–1.0)
NEUTROS PCT: 68 % (ref 43–77)
Neutro Abs: 7.2 10*3/uL (ref 1.7–7.7)
Platelets: 307 10*3/uL (ref 150–400)
RBC: 4.06 MIL/uL — AB (ref 4.22–5.81)
RDW: 14.5 % (ref 11.5–15.5)
WBC: 10.5 10*3/uL (ref 4.0–10.5)

## 2014-04-25 LAB — COMPREHENSIVE METABOLIC PANEL
ALT: 15 U/L (ref 0–53)
AST: 21 U/L (ref 0–37)
Albumin: 3.2 g/dL — ABNORMAL LOW (ref 3.5–5.2)
Alkaline Phosphatase: 71 U/L (ref 39–117)
Anion gap: 6 (ref 5–15)
BUN: 16 mg/dL (ref 6–23)
CALCIUM: 8.5 mg/dL (ref 8.4–10.5)
CO2: 24 mmol/L (ref 19–32)
CREATININE: 0.77 mg/dL (ref 0.50–1.35)
Chloride: 105 mmol/L (ref 96–112)
GFR calc Af Amer: 90 mL/min — ABNORMAL LOW (ref >90)
GFR calc non Af Amer: 78 mL/min — ABNORMAL LOW (ref >90)
GLUCOSE: 91 mg/dL (ref 70–99)
POTASSIUM: 4.1 mmol/L (ref 3.5–5.1)
Sodium: 135 mmol/L (ref 135–145)
TOTAL PROTEIN: 6.3 g/dL (ref 6.0–8.3)
Total Bilirubin: 0.9 mg/dL (ref 0.3–1.2)

## 2014-04-25 MED ORDER — CEFTRIAXONE SODIUM IN DEXTROSE 20 MG/ML IV SOLN
1.0000 g | INTRAVENOUS | Status: DC
  Administered 2014-04-25 – 2014-04-28 (×4): 1 g via INTRAVENOUS
  Filled 2014-04-25 (×4): qty 50

## 2014-04-25 NOTE — Evaluation (Signed)
Speech Language Pathology Evaluation Patient Details Name: Terrance Adams MRN: [id number] DOB: 1922/03/20 Today's Date: 04/25/2014 Time: [id number] SLP Time Calculation (min) (ACUTE ONLY): 10 min  Problem List:  Patient Active Problem List   Diagnosis Date Noted  . Altered mental state 04/24/2014  . Acute confusional state secondary to UTI 12/17/2013  . Weakness 10/08/2013  . Rhabdomyolysis 10/08/2013  . Leukocytosis, unspecified 10/08/2013  . Knee pain, bilateral 10/08/2013  . Normocytic anemia 10/08/2013  . Altered mental status 03/28/2011  . UTI (lower urinary tract infection) 03/28/2011  . UNSPECIFIED HEARING LOSS 01/04/2010  . PULMONARY NODULE 01/04/2010  . HYPERTROPHY PROSTATE W/UR OBST & OTH LUTS 01/04/2010  . ABNORMAL ELECTROCARDIOGRAM 01/04/2010   Past Medical History:  Past Medical History  Diagnosis Date  . UTI (urinary tract infection)     HX OF UTI   Past Surgical History:  Past Surgical History  Procedure Laterality Date  . Hernia repair     HPI:  81 yo [facility] with [facility], recent falls and possible conjuctivitis.  Diagnosed with metabolic encephalopathy likely multifactorial due to sinusitis and progressive dementia/delirium per Md note.  Pt has PMH + for dementia, delirum. Speech evaluation ordered.      Assessment / Plan / Recommendation Clinical Impression  Pt presents with severe cognitive linguistic deficits and moderate dysarthria impacting his speech, attention, orientation and basic problem solving skills.  Delayed processing of information resulted in pt responding to SLP questions approximately 10% of opportunities.  His expressive language is intact for needs - as he verbalized "you could be a beautiful woman, if you would know how to behave" when SLP was communicating with pt.  Toward end of brief session, pt began asking for "Terrance Adams" and "Terrance Adams" and stating the "cops need to be called".     Pt stated "what's that?" when SlP placed call bell within  his reach- SLP explained call bell use to pt however doubtful he will generalize due to dementia.    No family present to establish baseline.  Upon review of previous SLP visits with pt in Sept 2015, pt demonstrated impulsivity and became tearful during treatment session. Pt's brother Terrance Adams was present at that time.      Recommend 24/7 supervision/SNF due to pt level of cognitive deficit.  Skilled SLP may be beneficial at next level of care briefly to help transition pt into new environment using repetition to support generalization of skills.       SLP Assessment  All further Speech Lanaguage Pathology  needs can be addressed in the next venue of care    Follow Up Recommendations  Skilled Nursing facility    Frequency and Duration   n/a     Pertinent Vitals/Pain Pain Assessment: Faces Faces Pain Scale: No hurt   SLP Goals  Patient/Family Stated Goal: none stated  SLP Evaluation Prior Functioning  Cognitive/Linguistic Baseline: Information not available   Cognition  Overall Cognitive Status: No family/caregiver present to determine baseline cognitive functioning Arousal/Alertness: Awake/alert Orientation Level: Oriented to person;Disoriented to place;Disoriented to time Attention: Sustained Sustained Attention: Impaired Sustained Attention Impairment: Verbal basic Memory: Impaired Memory Impairment: Storage deficit;Retrieval deficit;Decreased recall of new information Awareness: Impaired Awareness Impairment: Intellectual impairment Problem Solving: Impaired Problem Solving Impairment: Verbal basic;Functional basic Behaviors: Restless;Verbal agitation Safety/Judgment: Impaired    Comprehension  Auditory Comprehension Overall Auditory Comprehension: Impaired Yes/No Questions: Not tested Commands: Impaired One Step Basic Commands: 25-49% accurate Conversation: Simple Interfering Components: Attention;Visual impairments;Hearing;Processing speed;Working  Radio broadcast assistantmemory EffectiveTechniques: Producer, television/film/videoxtra processing  time;Increased volume Visual Recognition/Discrimination Discrimination: Not tested Reading Comprehension Reading Status: Not tested    Expression Expression Primary Mode of Expression: Verbal Verbal Expression Overall Verbal Expression: Impaired Initiation: No impairment Level of Generative/Spontaneous Verbalization: Phrase;Sentence Naming: Not tested Pragmatics: Unable to assess Written Expression Dominant Hand:  (unknown )   Oral / Motor Oral Motor/Sensory Function Overall Oral Motor/Sensory Function:  (generalized weakness, pt did not follow commands for basic oral motor tasks) Motor Speech Overall Motor Speech: Impaired (unknown baseline, pt appeared xerostomic, declined to consume liquid in effort to faciliate speech) Respiration: Impaired Level of Impairment: Phrase Phonation: Low vocal intensity Articulation: Impaired Level of Impairment: Word Intelligibility: Intelligibility reduced Word: 75-100% accurate Phrase: 50-74% accurate Sentence: 50-74% accurate Conversation: Not tested Motor Planning: Witnin functional limits Motor Speech Errors: Not applicable   GO    Donavan Burnet, MS [facility] SLP [phone]

## 2014-04-25 NOTE — Clinical Documentation Improvement (Signed)
Possible Clinical Conditions?  Encephalopathy (describe type if known)                       Anoxic                       Septic                       Alcoholic                        Hepatic                       Hypertensive                       Metabolic                       Toxic Other Condition Cannot Clinically Determine   Supporting Information: "Acute confusion state likely multifactorial 2/2 sinusitis and progressive dementia/delerium - remains confused "  Thank You, Nevin BloodgoodJoan B Willma Obando, RN, BSN, CCDS,Clinical Documentation Specialist:  [phone]  [phone]=[facility] [place]- Health Information Management

## 2014-04-25 NOTE — Progress Notes (Signed)
CSW continuing to follow.  CSW received phone call from MD this morning that pt was started on IV antibiotic and MD inquiring if this met pt for inpatient criteria. CSW spoke with [facility] who reviewed pt and stated that pt now met inpatient criteria.   CSW met with pt brother at bedside. Pt brother stated that he attempted to meet with nurse at [facility] this morning, but the nurse was in a meeting. CSW discussed with pt brother that pt now met inpatient criteria and will likely qualify for insurance coverage for SNF as long as pt remains inpatient for 3 midnights. Pt brother agreeable to exploring rehab at SNF options. Pt brother stated the he would also like for nurse from [facility] to assess pt because if [facility] feels they can meet his needs then he would like pt to return to [place]. CSW discussed with pt brother that CSW will ask nurse from [facility] to assess pt, but CSW is unsure if they would be willing to accept pt back at this time with the amount of assistance pt is requiring. Pt brother expressed understanding, but stated that he would still like the evaluation to take place. CSW provided support as pt brother discussed that he is concerned about losing pt placement at [facility] as it took some time to get pt into facility initially.   CSW completed [name] and initiated SNF search to [facility].  CSW contacted [facility] and per Network engineer, nurse at [facility] is in a meeting today until 1 or 2 pm. CSW provided CSW contact information for [facility] nurse to contact CSW once out of meeting today.  CSW to follow up with pt brother regarding SNF bed offers and will communicate with [facility] regarding assessing pt per pt brother request.   CSW to continue to follow to provide support and assist with pt discharge planning needs.   Alison Murray, MSW, [place] Work [phone]

## 2014-04-25 NOTE — Progress Notes (Addendum)
Physician Daily Progress Note  Subjective: No events overnight  Objective: Vital signs in last 24 hours:   Filed Vitals:   04/24/14 0900 04/24/14 1416 04/24/14 2236 04/25/14 0737  BP:  149/64 154/71 155/77  Pulse:  64 62 64  Temp:  98.4 F (36.9 C) 97.4 F (36.3 C) 97.8 F (36.6 C)  TempSrc:  Axillary Oral Oral  Resp:  Height:  (1.753 m)     Weight:    122 lb 5.7 oz (55.5 kg)  SpO2:  100% 98% 100%   Weight change:     CBG (last 3)  No results for input(s): GLUCAP in the last 72 hours.  Intake/Output from previous day:  Intake/Output Summary (Last 24 hours) at 04/25/14 0814 Last data filed at 04/24/14 2238  Gross per 24 hour  Intake    240 ml  Output    950 ml  Net   -710 ml      Physical Exam General appearance: WM in NAD , still groggy this AM HEENT:  MMM, anicteric  Resp:   CTAB, no wr Cardio:  RRR< no mrg  GI: soft, non-tender; bowel sounds normal; no masses,  no organomegaly Extremities: no clubbing, cyanosis or edema   Labs: Basic Metabolic Panel:  Recent Labs Lab 04/23/14 1441 04/24/14 0444 04/25/14 0445  NA 138 141 135  K 3.8 3.8 4.1  CL 103 107 105  CO2 GLUCOSE 171* 92 91  BUN CREATININE 1.06 0.79 0.77  CALCIUM 8.9 8.2* 8.5  MG 2.2  --   --   PHOS 4.0  --   --    GFR Estimated Creatinine Clearance: 48.2 mL/min (by C-G formula based on Cr of 0.77). Liver Function Tests:  Recent Labs Lab 04/24/14 0444 04/25/14 0445  AST 21 21  ALT 16 15  ALKPHOS 59 71  BILITOT 0.3 0.9  PROT 5.6* 6.3  ALBUMIN 2.9* 3.2*   No results for input(s): LIPASE, AMYLASE in the last 168 hours.  Recent Labs Lab 04/24/14 0730  AMMONIA 19   Coagulation profile No results for input(s): INR, PROTIME in the last 168 hours.  CBC:  Recent Labs Lab 04/23/14 1441 04/24/14 0444 04/25/14 0445  WBC 9.8 9.2 10.5  NEUTROABS 6.3 5.9 7.2  HGB 11.1* 10.5* 11.8*  HCT 34.4* 33.0* 36.3*  MCV 90.5 89.9 89.4  PLT 283 259  307   Cardiac Enzymes: No results for input(s): CKTOTAL, CKMB, CKMBINDEX, TROPONINI in the last 168 hours. BNP (last 3 results) No results for input(s): PROBNP in the last 8760 hours. CBG: No results for input(s): GLUCAP in the last 168 hours. D-Dimer: No results for input(s): DDIMER in the last 72 hours. Hgb A1c: No results for input(s): HGBA1C in the last 72 hours. Lipid Profile:  Recent Labs  04/25/14 0445  CHOL 180  HDL 57  LDLCALC 107*  TRIG 79  CHOLHDL 3.2   Thyroid function studies:  Recent Labs  04/24/14 0730  TSH 1.082   Anemia work up:  Recent Labs  04/24/14 0826  VITAMINB12 914*   Sepsis Labs:  Recent Labs Lab 04/23/14 1441 04/24/14 0444 04/25/14 0445  WBC 9.8 9.2 10.5   Microbiology Recent Results (from the past 240 hour(s))  Urine culture     Status: None   Collection Time: 04/23/14  3:24 PM  Result Value Ref Range Status   Specimen Description URINE, CLEAN CATCH  Final   Special Requests  NONE  Final   Colony Count   Final    15,000 COLONIES/ML Performed at [facility]    Culture   Final    Multiple bacterial morphotypes present, none predominant. Suggest appropriate recollection if clinically indicated. Performed at [facility]    Report Status 04/24/2014 FINAL  Final  MRSA PCR Screening     Status: None   Collection Time: 04/23/14  9:51 PM  Result Value Ref Range Status   MRSA by PCR NEGATIVE NEGATIVE Final    Comment:        The GeneXpert MRSA Assay (FDA approved for NASAL specimens only), is one component of a comprehensive MRSA colonization surveillance program. It is not intended to diagnose MRSA infection nor to guide or monitor treatment for MRSA infections.      Marland Kitchen. antiseptic oral rinse  7 mL Mouth Rinse BID  . cefTRIAXone (ROCEPHIN)  IV  1 g Intravenous Q24H  . cyanocobalamin  500 mcg Oral Daily  . docusate sodium  100 mg Oral BID  . enoxaparin (LOVENOX) injection  40 mg Subcutaneous Q24H  .  feeding supplement (ENSURE COMPLETE)  237 mL Oral TID BM  . ferrous sulfate  325 mg Oral QHS  . nitrofurantoin  50 mg Oral QHS   Continuous Infusions: . sodium chloride 25 mL/hr at 04/23/14 2319     Assessment/Plan:  Metabolic encephalopathy - Acute confusion state likely multifactorial 2/2 sinusitis and progressive dementia/delerium - remains confused , starting IV rocephin for sinus infection given intolerance to PO and awaiting speech evaluation at this time. PT/OT have rec'd SNF, which SW is working on   Protein calorie malnutrition - protein shakes   B12 def - on PO supplements .  levels at goal   Iron def - on supplements. Hgb stable  PPx - lovenox   Full CODE   Dispo - if we can achieve IP status, we will place in SNF. If he remains OBS despite IV abx, will have to return to ALF per family wishes     LOS: 2 days   Laquetta Racey 04/25/2014, 8:14 AM  I , or a covering physician , can be reached for questions at [phone].

## 2014-04-25 NOTE — Progress Notes (Addendum)
Clinical Social Work Department CLINICAL SOCIAL WORK PLACEMENT NOTE 04/25/2014  Patient:  [name],Terrance Adams  Account Number:  [id number] Admit date:  04/23/2014  Clinical Social Worker:  Garlan FairSUZANNA KIDD, LCSWA  Date/time:  04/25/2014 11:50 AM  Clinical Social Work is seeking post-discharge placement for this patient at the following level of care:   SKILLED NURSING   (*CSW will update this form in Epic as items are completed)   04/25/2014  Patient/family provided with [facility] Department of Clinical Social Work's list of facilities offering this level of care within the geographic area requested by the patient (or if unable, by the patient's family).  04/25/2014  Patient/family informed of their freedom to choose among providers that offer the needed level of care, that participate in Medicare, Medicaid or managed care program needed by the patient, have an available bed and are willing to accept the patient.  04/25/2014  Patient/family informed of [name]' ownership interest in [name], as well as of the fact that they are under no obligation to receive care at this facility.  PASARR submitted to [name] on 04/25/2014 PASARR number received on 04/25/2014  [name] transmitted to all facilities in geographic area requested by pt/family on  04/25/2014 [name] transmitted to all facilities within larger geographic area on   Patient informed that his/her managed care company has contracts with or will negotiate with  certain facilities, including the following:     Patient/family informed of bed offers received:  04/26/2014 [name] Physician recommends and patient chooses bed at    [name] 04/28/2014 Patient to be transferred to facility by ambulance [name](PTAR) Patient and family notified of transfer on 04/28/2014 Name of family member notified:  Pt brother, [name] notified at bedside.  The following  physician request were entered in Epic:   Additional Comments:   Loletta SpecterSuzanna Kidd, MSW, LCSW Clinical Social Work [phone]

## 2014-04-26 LAB — COMPREHENSIVE METABOLIC PANEL
ALT: 15 U/L (ref 0–53)
AST: 19 U/L (ref 0–37)
Albumin: 3.3 g/dL — ABNORMAL LOW (ref 3.5–5.2)
Alkaline Phosphatase: 72 U/L (ref 39–117)
Anion gap: 8 (ref 5–15)
BUN: 16 mg/dL (ref 6–23)
CO2: 26 mmol/L (ref 19–32)
Calcium: 8.8 mg/dL (ref 8.4–10.5)
Chloride: 101 mmol/L (ref 96–112)
Creatinine, Ser: 0.76 mg/dL (ref 0.50–1.35)
GFR, EST NON AFRICAN AMERICAN: 78 mL/min — AB (ref >90)
Glucose, Bld: 108 mg/dL — ABNORMAL HIGH (ref 70–99)
POTASSIUM: 4.3 mmol/L (ref 3.5–5.1)
Sodium: 135 mmol/L (ref 135–145)
Total Bilirubin: 0.5 mg/dL (ref 0.3–1.2)
Total Protein: 6.5 g/dL (ref 6.0–8.3)

## 2014-04-26 LAB — CBC WITH DIFFERENTIAL/PLATELET
BASOS PCT: 0 % (ref 0–1)
Basophils Absolute: 0.1 10*3/uL (ref 0.0–0.1)
EOS ABS: 0.4 10*3/uL (ref 0.0–0.7)
EOS PCT: 3 % (ref 0–5)
HCT: 36.5 % — ABNORMAL LOW (ref 39.0–52.0)
HEMOGLOBIN: 11.9 g/dL — AB (ref 13.0–17.0)
Lymphocytes Relative: 21 % (ref 12–46)
Lymphs Abs: 2.3 10*3/uL (ref 0.7–4.0)
MCH: 28.7 pg (ref 26.0–34.0)
MCHC: 32.6 g/dL (ref 30.0–36.0)
MCV: 88 fL (ref 78.0–100.0)
MONO ABS: 1.7 10*3/uL — AB (ref 0.1–1.0)
Monocytes Relative: 15 % — ABNORMAL HIGH (ref 3–12)
Neutro Abs: 6.7 10*3/uL (ref 1.7–7.7)
Neutrophils Relative %: 61 % (ref 43–77)
Platelets: 301 10*3/uL (ref 150–400)
RBC: 4.15 MIL/uL — AB (ref 4.22–5.81)
RDW: 14.3 % (ref 11.5–15.5)
WBC: 11.1 10*3/uL — AB (ref 4.0–10.5)

## 2014-04-26 MED ORDER — POLYMYXIN B-TRIMETHOPRIM 10000-0.1 UNIT/ML-% OP SOLN
1.0000 [drp] | Freq: Four times a day (QID) | OPHTHALMIC | Status: DC
  Administered 2014-04-26 – 2014-04-27 (×8): 1 [drp] via OPHTHALMIC
  Filled 2014-04-26: qty 10

## 2014-04-26 NOTE — Progress Notes (Addendum)
Physician Daily Progress Note  Subjective: No events overnight  Objective: Vital signs in last 24 hours:   Filed Vitals:   04/25/14 1352 04/25/14 2133 04/26/14 0500 04/26/14 0658  BP: 135/67 134/62  129/63  Pulse: 64 69  60  Temp: 97.3 F (36.3 C) 98.6 F (37 C)  98.3 F (36.8 C)  TempSrc: Tympanic Axillary  Axillary  Resp: Height:      Weight:   122 lb 12.7 oz (55.7 kg)   SpO2: 99% 100%  100%   Weight change:  Last BM Date: 04/23/14  CBG (last 3)  No results for input(s): GLUCAP in the last 72 hours.  Intake/Output from previous day:  Intake/Output Summary (Last 24 hours) at 04/26/14 0739 Last data filed at 04/26/14 0325  Gross per 24 hour  Intake    500 ml  Output    800 ml  Net   -300 ml      Physical Exam General appearance: WM in NAD , still groggy this AM, oriented only to name HEENT:  Dry MM, anicteric , crusted eyes B/L  Resp:   CTAB, no wr Cardio:  RRR< no mrg  GI: soft, non-tender; bowel sounds normal; no masses,  no organomegaly Extremities: no clubbing, cyanosis or edema   Labs:  Results for WATARU, MCCOWEN (MRN [id number]) as of 04/26/2014 07:41  Ref. Range 04/24/2014 07:30 04/24/2014 08:26 04/24/2014 16:51 04/25/2014 04:45 04/26/2014 04:09  Sodium Latest Range: 135-145 mmol/L    135 135  Potassium Latest Range: 3.5-5.1 mmol/L    4.1 4.3  Chloride Latest Range: 96-112 mmol/L    105 101  CO2 Latest Range: 19-32 mmol/L    24 26  BUN Latest Range: 6-23 mg/dL    16 16  Creatinine Latest Range: 0.50-1.35 mg/dL    4.09 8.11  Calcium Latest Range: 8.4-10.5 mg/dL    8.5 8.8  GFR calc non Af Amer Latest Range: >90 mL/min    78 (L) 78 (L)  GFR calc Af Amer Latest Range: >90 mL/min    90 (L) >90  Glucose Latest Range: 70-99 mg/dL    91 914 (H)  Anion gap Latest Range: 5-15     6 8   Alkaline Phosphatase Latest Range: 39-117 U/L    71 72  Albumin Latest Range: 3.5-5.2 g/dL    3.2 (L) 3.3 (L)  AST Latest Range: 0-37 U/L    21 19  ALT Latest Range:  0-53 U/L    15 15  Total Protein Latest Range: 6.0-8.3 g/dL    6.3 6.5  Ammonia Latest Range: 11-32 umol/L 19      Total Bilirubin Latest Range: 0.3-1.2 mg/dL    0.9 0.5  Cholesterol Latest Range: 0-200 mg/dL    782   Triglycerides Latest Range: <150 mg/dL    79   HDL Latest Range: >39 mg/dL    57   LDL (calc) Latest Range: 0-99 mg/dL    956 (H)   VLDL Latest Range: 0-40 mg/dL    16   Total CHOL/HDL Ratio Latest Units: RATIO    3.2   Vitamin B-12 Latest Range: 211-911 pg/mL  914 (H)     WBC Latest Range: 4.0-10.5 K/uL    10.5 11.1 (H)  RBC Latest Range: 4.22-5.81 MIL/uL    4.06 (L) 4.15 (L)  Hemoglobin Latest Range: 13.0-17.0 g/dL    21.3 (L) 08.6 (L)  HCT Latest Range: 39.0-52.0 %    36.3 (L) 36.5 (L)  MCV Latest Range: 78.0-100.0 fL    89.4 88.0  MCH Latest Range: 26.0-34.0 pg    29.1 28.7  MCHC Latest Range: 30.0-36.0 g/dL    16.132.5 09.632.6  RDW Latest Range: 11.5-15.5 %    14.5 14.3  Platelets Latest Range: 150-400 K/uL    307 301  Neutrophils Relative % Latest Range: 43-77 %    68 61  Lymphocytes Relative Latest Range: 12-46 %    14 21  Monocytes Relative Latest Range: 3-12 %    13 (H) 15 (H)  Eosinophils Relative Latest Range: 0-5 %    4 3  Basophils Relative Latest Range: 0-1 %    1 0  NEUT# Latest Range: 1.7-7.7 K/uL    7.2 6.7  Lymphocytes Absolute Latest Range: 0.7-4.0 K/uL    1.5 2.3  Monocytes Absolute Latest Range: 0.1-1.0 K/uL    1.4 (H) 1.7 (H)  Eosinophils Absolute Latest Range: 0.0-0.7 K/uL    0.4 0.4  Basophils Absolute Latest Range: 0.0-0.1 K/uL    0.1 0.1  TSH Latest Range: 0.350-4.500 uIU/mL 1.082      MR BRAIN W WO CONTRAST No range found   Rpt          Microbiology Recent Results (from the past 240 hour(s))  Urine culture     Status: None   Collection Time: 04/23/14  3:24 PM  Result Value Ref Range Status   Specimen Description URINE, CLEAN CATCH  Final   Special Requests NONE  Final   Colony Count   Final    15,000 COLONIES/ML Performed at [facility]    Culture   Final    Multiple bacterial morphotypes present, none predominant. Suggest appropriate recollection if clinically indicated. Performed at [facility]    Report Status 04/24/2014 FINAL  Final  MRSA PCR Screening     Status: None   Collection Time: 04/23/14  9:51 PM  Result Value Ref Range Status   MRSA by PCR NEGATIVE NEGATIVE Final    Comment:        The GeneXpert MRSA Assay (FDA approved for NASAL specimens only), is one component of a comprehensive MRSA colonization surveillance program. It is not intended to diagnose MRSA infection nor to guide or monitor treatment for MRSA infections.      Marland Kitchen. antiseptic oral rinse  7 mL Mouth Rinse BID  . cefTRIAXone (ROCEPHIN)  IV  1 g Intravenous Q24H  . cyanocobalamin  500 mcg Oral Daily  . docusate sodium  100 mg Oral BID  . enoxaparin (LOVENOX) injection  40 mg Subcutaneous Q24H  . feeding supplement (ENSURE COMPLETE)  237 mL Oral TID BM  . ferrous sulfate  325 mg Oral QHS  . nitrofurantoin  50 mg Oral QHS  . trimethoprim-polymyxin b  1 drop Both Eyes 4 times per day   Continuous Infusions: . sodium chloride 25 mL/hr at 04/23/14 2319     Assessment/Plan:  Metabolic encephalopathy - Acute confusion state likely multifactorial 2/2 sinusitis and progressive dementia/delerium - remains confused ,  IV rocephin for sinus infection day 2/7 given intolerance to PO. PT/OT/speech have rec'd SNF, which SW is working on placement. To SNF when bed available. Pt not appropriate for ALF at this point   Dementia - progressive. SNF placement Sinusitis- rocephin Dehydration - increasing NS to 75cc/hr today Conjunctivitis - polytrim drops daily , day 1/7  Protein calorie malnutrition - protein shakes   B12 def - on PO supplements .  levels at goal  Iron def - on supplements. Hgb stable  PPx - lovenox   Full CODE   Dispo - to SNF on Friday     LOS: 3 days   Jordanna Hendrie 04/26/2014, 7:39 AM  I ,  or a covering physician , can be reached for questions at [phone].

## 2014-04-26 NOTE — Progress Notes (Signed)
Occupational Therapy Treatment Patient Details Name: Terrance Adams MRN: [id number] DOB: July 11, 1921 Today's Date: 04/26/2014    History of present illness Pt admitted from ALF after having 3 falls in last several days including 1 from his w/c. Pt admitted with AMS and possible conjuctivitis.   OT comments  Pt was able to follow commands--very HOH.  Performed grooming and drank Ensure when placed in his hands.  Total A x 2 for transfer back to bed  Follow Up Recommendations  SNF    Equipment Recommendations  None recommended by OT    Recommendations for Other Services      Precautions / Restrictions Precautions Precautions: Fall Precaution Comments: 3 falls in several days prior to admission with last fall being from his w/c Restrictions Weight Bearing Restrictions: No       Mobility Bed Mobility Overal bed mobility: Needs Assistance Bed Mobility: Sit to Supine     Supine to sit: Total assist Sit to supine: Total assist;+2 for physical assistance   General bed mobility comments: assist to guide trunk and lift legs.  Transfers Overall transfer level: Needs assistance Equipment used: None Transfers: Squat Pivot Transfers Sit to Stand: Total assist;+2 physical assistance         General transfer comment: pt with tightness throughout legs.  Performed SQPT back to bed    Balance                                   ADL Overall ADL's : Needs assistance/impaired Eating/Feeding: Sitting;Minimal assistance (handed ensure to pt:  he was able to drink it)   Grooming: Oral care;Sitting;Minimal assistance;Moderate assistance;Wash/dry face (min A face; mod A toothbrushing)                   Toilet Transfer: Squat-pivot;Total assistance;+2 for physical assistance (recliner to bed)             General ADL Comments: ? pt's vision.  eyes partially opened, one at a time.  He was able to drink ensure when handed to him.  Washed face when wet washcloth  handed to him.  He performed brushing teeth but assistance given to put toothpaste on toothbrush, hand him cup to rinse with, hold emesis basin for him to spit into.      Vision                     Perception     Praxis      Cognition   Behavior During Therapy: Flat affect Overall Cognitive Status: No family/caregiver present to determine baseline cognitive functioning (follows commands HOH)                       Extremity/Trunk Assessment               Exercises     Shoulder Instructions       General Comments      Pertinent Vitals/ Pain       Pain Assessment: Faces Faces Pain Scale: Hurts little more Pain Location: L LE Pain Intervention(s): Limited activity within patient's tolerance;Monitored during session;Repositioned  Home Living                                          Prior Functioning/Environment  Frequency Min 2X/week     Progress Toward Goals  OT Goals(current goals can now be found in the care plan section)  Progress towards OT goals: Progressing toward goals (goals updated)  ADL Goals Pt Will Perform Grooming: with min assist;sitting (x 2 tasks with full set up) Pt Will Transfer to Toilet: with max assist;with +2 assist;squat pivot transfer;stand pivot transfer;bedside commode  Plan      Co-evaluation                 End of Session     Activity Tolerance Patient tolerated treatment well   Patient Left in bed;with call bell/phone within reach   Nurse Communication          Time: [id number] OT Time Calculation (min): 28 min  Charges: OT General Charges $OT Visit: 1 Procedure OT Treatments $Self Care/Home Management : 8-22 mins $Therapeutic Activity: 8-22 mins  Maritza Hosterman 04/26/2014, 3:09 PM  Marica OtterMaryellen Marsia Cino, OTR/L [phone] 04/26/2014

## 2014-04-26 NOTE — Progress Notes (Signed)
MD, patient's eyes are extremely swollen, red, crusted over.  Could he have some eye drops in case this is conjunctivitis please? Thanks, Kenton Kingfisher[place], Marlise Fahr [place]

## 2014-04-26 NOTE — Progress Notes (Addendum)
CSW continuing to follow for pt disposition needs.  CSW met with pt brother, Broadus John at bedside along with [facility] RN, Dana Allan. Pt brother expressed that he strongly would like for pt to return to [facility]F. CSW discussed with ALF RN regarding pt current medical issues and treatment and PT evaluation. [facility] requested for PT to see pt today in order to see if pt is more mobile and determine if ALF feels that facility can meet pt needs. Pt brother agreeable to this as he strongly prefers pt to return to [facility]. [facility] RN discussed that pt has had [facility] PT in the past and pt did very well with [facility].   CSW spoke to PT and PT plans to see pt today.  CSW to fax PT note to [facility] once available in order for facility to make decision regarding if pt can return. Pt brother expressed understanding that if [facility] then CSW will have to provide pt brother regarding SNF bed offers.   CSW to continue to follow to provide support and assist with pt disposition planning.  Addendum 2:20 pm:  CSW continuing to follow.  CSW noted that PT treatment note now available.   CSW faxed PT note to [facility]. CSW contacted [facility] RN, Dana Allan who stated that she will review note and contact this CSW back with decision. CSW to await response.   CSW to continue to follow.  Addendum 2:40 pm:  CSW received notification from [facility] RN, Dana Allan that given that pt continues to require +2 assist that [facility] feels that pt would benefit from a short stay in rehab before returning to [place]. [facility] RN plans to contact pt brother, Broadus John to notify. CSW contacted pt brother, Broadus John and left voice message. CSW to await pt brother return phone call in order to provide SNF bed offers.   CSW contacted MD office to update MD.  CSW to continue to follow.  Addendum 4:00 pm:  CSW  received return phone call from pt brother, Broadus John. CSW discussed with pt brother SNF bed offers.   Pt brother plans to consider options and make decision.   Pt brother plans to be at hospital tomorrow morning and CSW will follow up with pt brother regarding decision.  CSW to continue to follow to provide support and assist with pt discharge planning needs.   Alison Murray, MSW, LCSW Clinical Social Work [phone]                                                                                                                     .

## 2014-04-26 NOTE — Progress Notes (Signed)
Physical Therapy Treatment Patient Details Name: Terrance Adams MRN: [id number] DOB: 07-06-1921 Today's Date: 04/26/2014    History of Present Illness Pt admitted from ALF after having 3 falls in last several days including 1 from his w/c. Pt admitted with AMS and possible conjuctivitis.    PT Comments    Pt in bed with untouched lunch tray and sleeping.  Very difficult to arouse.  Assisted OOB required + 2 assist pt 15%.  Based on pt's B hip and knee tightness, believe he sits long periods at 90/90.  Attempted amb with B platform AVA walker and + 3 assist pt was able to progress to 16 feet with poor steppage and scissored gait.   Follow Up Recommendations  SNF (unless current ALF can provided 24/7 care and ensure a safe D/C.  pt requires assist with all mobility and self care. )     Equipment Recommendations       Recommendations for Other Services       Precautions / Restrictions Precautions Precautions: Fall Precaution Comments: 3 falls in several days prior to admission with last fall being from his w/c Restrictions Weight Bearing Restrictions: No    Mobility  Bed Mobility Overal bed mobility: Needs Assistance Bed Mobility: Sit to Supine     Supine to sit: Total assist     General bed mobility comments: eyes shut most of time and difficult to arrouse.  Did assist pt to EOB but required total assist and use of bed pad to scoot hips such that B feet on floor.  Once upright, pt did increase arrousal and sat EOB at Supervision level.    Transfers Overall transfer level: Needs assistance Equipment used: None Transfers: Sit to/from Stand Sit to Stand: +2 physical assistance;Total assist         General transfer comment: + 2 total assist to stand and rise from bed.  Poor posture B hip and knee flexion and posterior LOB.  narrow BOS and still difficult to arrouse.   Ambulation/Gait Ambulation/Gait assistance: +2 physical assistance;Max assist Ambulation Distance  (Feet): 16 Feet Assistive device: Bilateral platform walker (EVA walker) Gait Pattern/deviations: Step-to pattern;Step-through pattern;Scissoring;Ataxic;Trunk flexed;Narrow base of support Gait velocity: decreased   General Gait Details: based on his B hip and knee flexion tightness, pt looks to be sitting at 90/90 in a wheelchair long term.  Difficulty amb with poor standing posture and poor balance.  Slowly pt was becoming maore alert and stepping however scissored  gait and esxcessive assist needed.   Stairs            Wheelchair Mobility    Modified Rankin (Stroke Patients Only)       Balance                                    Cognition Arousal/Alertness: Awake/alert (very difficult to arrouse) Behavior During Therapy: Flat affect                        Exercises      General Comments        Pertinent Vitals/Pain Pain Assessment: Faces Faces Pain Scale: Hurts a little bit Pain Location: L LE Pain Intervention(s): Monitored during session    Home Living                      Prior Function  PT Goals (current goals can now be found in the care plan section) Progress towards PT goals: Progressing toward goals    Frequency  Min 3X/week    PT Plan      Co-evaluation             End of Session   Activity Tolerance: Patient limited by lethargy Patient left: in chair;with call bell/phone within reach;with chair alarm set     Time: 1610-9604 PT Time Calculation (min) (ACUTE ONLY): 18 min  Charges:  $Gait Training: 8-22 mins                    G Codes:      Felecia Shelling  PTA [facility]  Acute  Rehab Pager      [phone]

## 2014-04-27 LAB — BASIC METABOLIC PANEL
Anion gap: 7 (ref 5–15)
BUN: 17 mg/dL (ref 6–23)
CO2: 27 mmol/L (ref 19–32)
CREATININE: 0.8 mg/dL (ref 0.50–1.35)
Calcium: 8.5 mg/dL (ref 8.4–10.5)
Chloride: 102 mmol/L (ref 96–112)
GFR, EST AFRICAN AMERICAN: 88 mL/min — AB (ref >90)
GFR, EST NON AFRICAN AMERICAN: 76 mL/min — AB (ref >90)
Glucose, Bld: 121 mg/dL — ABNORMAL HIGH (ref 70–99)
Potassium: 4.1 mmol/L (ref 3.5–5.1)
SODIUM: 136 mmol/L (ref 135–145)

## 2014-04-27 LAB — CBC
HCT: 35 % — ABNORMAL LOW (ref 39.0–52.0)
Hemoglobin: 11.4 g/dL — ABNORMAL LOW (ref 13.0–17.0)
MCH: 29 pg (ref 26.0–34.0)
MCHC: 32.6 g/dL (ref 30.0–36.0)
MCV: 89.1 fL (ref 78.0–100.0)
Platelets: 296 10*3/uL (ref 150–400)
RBC: 3.93 MIL/uL — ABNORMAL LOW (ref 4.22–5.81)
RDW: 14.3 % (ref 11.5–15.5)
WBC: 10.9 10*3/uL — AB (ref 4.0–10.5)

## 2014-04-27 NOTE — Progress Notes (Addendum)
CSW continuing to follow.  CSW followed up with pt brother, Terrance Adams at bedside this morning. Pt brother stated that he has reviewed list of bed offers and [facility]. Pt brother stated that he wishes to go tour [facility] this morning in order to confirm his decision.   [facility] for [facility].  Pt brother will notify CSW [facility] if [facility] is [facility].  Per MD, anticipate d/c tomorrow.   CSW to continue to follow to provide support and assist with pt discharge planning needs.   Addendum 10:29 am:  CSW received phone call from pt brother, Terrance Adams.  Pt brother states that he is agreeable to [facility].   [facility] notify of [facility] brother acceptance of bed offer. CS[facility] will have [facility] available tomorrow.   CSW to facilitate [facility].  Loletta SpecterSuzanna Ayansh Adams, MSW, LCSW Clinical Social Work [phone]

## 2014-04-27 NOTE — Progress Notes (Signed)
Physician Daily Progress Note  Subjective: No events overnight  Objective: Vital signs in last 24 hours:   Filed Vitals:   04/26/14 0658 04/26/14 1445 04/26/14 2110 04/27/14 0500  BP: 129/63 121/66 131/56 138/66  Pulse: 60 64 72 66  Temp: 98.3 F (36.8 C) 98.1 F (36.7 C) 99 F (37.2 C) 97.7 F (36.5 C)  TempSrc: Axillary Oral Axillary Oral  Resp: 16 18 20 20   Height:      Weight:    121 lb 4.1 oz (55 kg)  SpO2: 100% 100% 99% 97%   Weight change: -1 lb 1.6 oz (-0.5 kg) Last BM Date: 04/26/14  CBG (last 3)  No results for input(s): GLUCAP in the last 72 hours.  Intake/Output from previous day:  Intake/Output Summary (Last 24 hours) at 04/27/14 0759 Last data filed at 04/27/14 0502  Gross per 24 hour  Intake    200 ml  Output   1375 ml  Net  -1175 ml      Physical Exam General appearance: WM in NAD , still groggy this AM, oriented only to name HEENT:  Dry MM, anicteric , crusted eyes B/L  Resp:   CTAB, no wr Cardio:  RRR< no mrg  GI: soft, non-tender; bowel sounds normal; no masses,  no organomegaly Extremities: no clubbing, cyanosis or edema   Labs:  Results for [name], Raydel E (MRN [id number]) as of 04/26/2014 07:41  Ref. Range 04/24/2014 07:30 04/24/2014 08:26 04/24/2014 16:51 04/25/2014 04:45 04/26/2014 04:09  Sodium Latest Range: 135-145 mmol/L    135 135  Potassium Latest Range: 3.5-5.1 mmol/L    4.1 4.3  Chloride Latest Range: 96-112 mmol/L    105 101  CO2 Latest Range: 19-32 mmol/L    24 26  BUN Latest Range: 6-23 mg/dL    16 16  Creatinine Latest Range: 0.50-1.35 mg/dL    8.840.77 1.660.76  Calcium Latest Range: 8.4-10.5 mg/dL    8.5 8.8  GFR calc non Af Amer Latest Range: >90 mL/min    78 (L) 78 (L)  GFR calc Af Amer Latest Range: >90 mL/min    90 (L) >90  Glucose Latest Range: 70-99 mg/dL    91 [id number] (H)  Anion gap Latest Range: 5-15     6 8   Alkaline Phosphatase Latest Range: 39-117 U/L    71 72  Albumin Latest Range: 3.5-5.2 g/dL    3.2 (L) 3.3 (L)  AST  Latest Range: 0-37 U/L    21 19  ALT Latest Range: 0-53 U/L    15 15  Total Protein Latest Range: 6.0-8.3 g/dL    6.3 6.5  Ammonia Latest Range: 11-32 umol/L 19      Total Bilirubin Latest Range: 0.3-1.2 mg/dL    0.9 0.5  Cholesterol Latest Range: 0-200 mg/dL    016180   Triglycerides Latest Range: <150 mg/dL    79   HDL Latest Range: >39 mg/dL    57   LDL (calc) Latest Range: 0-99 mg/dL    010107 (H)   VLDL Latest Range: 0-40 mg/dL    16   Total CHOL/HDL Ratio Latest Units: RATIO    3.2   Vitamin B-12 Latest Range: 211-911 pg/mL  914 (H)     WBC Latest Range: 4.0-10.5 K/uL    10.5 11.1 (H)  RBC Latest Range: 4.22-5.81 MIL/uL    4.06 (L) 4.15 (L)  Hemoglobin Latest Range: 13.0-17.0 g/dL    93.211.8 (L) 35.511.9 (L)  HCT Latest Range: 39.0-52.0 %  36.3 (L) 36.5 (L)  MCV Latest Range: 78.0-100.0 fL    89.4 88.0  MCH Latest Range: 26.0-34.0 pg    29.1 28.7  MCHC Latest Range: 30.0-36.0 g/dL    16.1 09.6  RDW Latest Range: 11.5-15.5 %    14.5 14.3  Platelets Latest Range: 150-400 K/uL    307 301  Neutrophils Relative % Latest Range: 43-77 %    68 61  Lymphocytes Relative Latest Range: 12-46 %    14 21  Monocytes Relative Latest Range: 3-12 %    13 (H) 15 (H)  Eosinophils Relative Latest Range: 0-5 %    4 3  Basophils Relative Latest Range: 0-1 %    1 0  NEUT# Latest Range: 1.7-7.7 K/uL    7.2 6.7  Lymphocytes Absolute Latest Range: 0.7-4.0 K/uL    1.5 2.3  Monocytes Absolute Latest Range: 0.1-1.0 K/uL    1.4 (H) 1.7 (H)  Eosinophils Absolute Latest Range: 0.0-0.7 K/uL    0.4 0.4  Basophils Absolute Latest Range: 0.0-0.1 K/uL    0.1 0.1  TSH Latest Range: 0.350-4.500 uIU/mL 1.082      MR BRAIN W WO CONTRAST No range found   Rpt          Microbiology Recent Results (from the past 240 hour(s))  Urine culture     Status: None   Collection Time: 04/23/14  3:24 PM  Result Value Ref Range Status   Specimen Description URINE, CLEAN CATCH  Final   Special Requests NONE  Final   Colony Count    Final    15,000 COLONIES/ML Performed at [facility]    Culture   Final    Multiple bacterial morphotypes present, none predominant. Suggest appropriate recollection if clinically indicated. Performed at [facility]    Report Status 04/24/2014 FINAL  Final  MRSA PCR Screening     Status: None   Collection Time: 04/23/14  9:51 PM  Result Value Ref Range Status   MRSA by PCR NEGATIVE NEGATIVE Final    Comment:        The GeneXpert MRSA Assay (FDA approved for NASAL specimens only), is one component of a comprehensive MRSA colonization surveillance program. It is not intended to diagnose MRSA infection nor to guide or monitor treatment for MRSA infections.      Marland Kitchen antiseptic oral rinse  7 mL Mouth Rinse BID  . cefTRIAXone (ROCEPHIN)  IV  1 g Intravenous Q24H  . cyanocobalamin  500 mcg Oral Daily  . docusate sodium  100 mg Oral BID  . enoxaparin (LOVENOX) injection  40 mg Subcutaneous Q24H  . feeding supplement (ENSURE COMPLETE)  237 mL Oral TID BM  . ferrous sulfate  325 mg Oral QHS  . nitrofurantoin  50 mg Oral QHS  . trimethoprim-polymyxin b  1 drop Both Eyes QID   Continuous Infusions: . sodium chloride 75 mL/hr at 04/27/14 0454     Assessment/Plan:  Metabolic encephalopathy - Acute confusion state likely multifactorial 2/2 sinusitis and progressive dementia/delerium - confusion improving,  IV rocephin for sinus infection day 3/7 given intolerance to PO. PT/OT/speech have rec'd SNF, which SW is working on placement. To SNF tomorrow  Dementia - progressive. SNF placement Sinusitis- rocephin Dehydration - on NS at 75cc/hr today Conjunctivitis - polytrim drops daily , day 2/7  Protein calorie malnutrition - protein shakes   B12 def - on PO supplements .  levels at goal   Iron def - on supplements. Hgb stable  PPx - lovenox   Full CODE   Dispo - to SNF tomorrow    LOS: 4 days   Terrance Adams 04/27/2014, 7:59 AM  I , or a covering  physician , can be reached for questions at [phone].

## 2014-04-28 MED ORDER — ENSURE COMPLETE PO LIQD: 237.0000 mL | Freq: Three times a day (TID) | ORAL | Status: AC

## 2014-04-28 MED ORDER — CEFDINIR 300 MG PO CAPS: ORAL_CAPSULE | ORAL | Status: DC

## 2014-04-28 MED ORDER — POLYMYXIN B-TRIMETHOPRIM 10000-0.1 UNIT/ML-% OP SOLN: 1.0000 [drp] | Freq: Four times a day (QID) | OPHTHALMIC | Status: DC

## 2014-04-28 NOTE — Progress Notes (Signed)
[facility].  CSW facilitated pt discharge needs including contacting facility, faxing pt discharge information via [facility], discussing with pt brother, [name] at bedside, providing RN phone number to call report, and arranging ambulance transport scheduled [facility] 11 am [facility].  Pt brother appreciative of CSW support and assistance. Pt eager [facility] in order for pt to get back to [facility].   No further social work needs identified at this time.  CSW signing off.   Loletta SpecterSuzanna Kidd, MSW, LCSW Clinical Social Work [phone]

## 2014-04-28 NOTE — Care Management Note (Signed)
Medicare Important Message given?  YES (If response is "NO", the following Medicare IM given date fields will be blank) Date Medicare IM given:  04/28/2014 Medicare IM given by:  Joellyn Grandt Date Additional Medicare IM given:   Additional Medicare IM given by:   

## 2014-04-28 NOTE — Discharge Summary (Addendum)
Physician Discharge Summary    Terrance Adams  MR#: [id number]  DOB:[birth year]  Date of Admission: 04/23/2014 Date of Discharge: 04/28/2014  Attending Physician:Terrance Adams  Patient's WUJ:[id number] Terrance Diener, MD  Consults:  PT/OT/Speech/nutrition  Discharge Diagnoses: Active Problems:   Altered mental status   Altered mental state   Discharge Medications:   Medication List    STOP taking these medications        cefpodoxime 200 MG tablet  Commonly known as:  VANTIN     furosemide 20 MG tablet  Commonly known as:  LASIX      TAKE these medications        acetaminophen 325 MG tablet  Commonly known as:  TYLENOL  Take 2 tablets (650 mg total) by mouth every 4 (four) hours as needed for mild pain, fever or headache.     cefdinir 300 MG capsule  Commonly known as:  OMNICEF  Take 1 capsule by mouth twice daily w/ last dose on evening of 2/1     cyanocobalamin 500 MCG tablet  Take 1 tablet (500 mcg total) by mouth daily.     feeding supplement (ENSURE COMPLETE) Liqd  Take 237 mLs by mouth 3 (three) times daily between meals.     ferrous sulfate 325 (65 FE) MG tablet  Take 325 mg by mouth at bedtime.     nitrofurantoin 50 MG capsule  Commonly known as:  MACRODANTIN  Take 50 mg by mouth at bedtime.     senna-docusate 8.6-50 MG per tablet  Commonly known as:  Senokot-S  Take 1 tablet by mouth at bedtime.     silver sulfADIAZINE 1 % cream  Commonly known as:  SILVADENE  Apply 1 application topically daily.     traZODone 50 MG tablet  Commonly known as:  DESYREL  Take 0.5 tablets (25 mg total) by mouth at bedtime as needed for sleep.     trimethoprim-polymyxin b ophthalmic solution  Commonly known as:  POLYTRIM  Place 1 drop into both eyes 4 (four) times daily. Last dose is evening of 2/2        Hospital Procedures: Dg Chest 2 View  04/23/2014   CLINICAL DATA:  Three falls in the last 3 days. Lethargy. Poor ambulation.  EXAM: CHEST  2 VIEW   COMPARISON:  Multiple exams, including 12/18/2013  FINDINGS: Elevated left hemidiaphragm. Tortuosity and atherosclerotic calcification of the aortic arch. Mild enlargement of the cardiopericardial silhouette. Faint Kerley B-lines at the right lung base.  Eliminated acromion humeral distance on the right. Bony demineralization. Old healed right-sided rib fractures.  Continued bandlike opacities along both hemidiaphragms, likely from scarring or atelectasis. Vague density over the left chest is probably a skin fold.  Thoracolumbar spondylosis with poor definition of the vertebral bodies on the lateral projection due to motion.  IMPRESSION: 1. Mild cardiomegaly with faint Kerley B-lines which could reflect low-level interstitial edema. 2. Mild bibasilar atelectasis or scarring. Elevated left hemidiaphragm. 3. Atherosclerosis. 4. Suspected chronic rotator cuff tear on the right.   Electronically Signed   By: Terrance Adams M.D.   On: 04/23/2014 16:32   Dg Pelvis 1-2 Views  04/23/2014   CLINICAL DATA:  Multiple recent falls over the last 3 days. Lethargy. Poor ambulation. Initial encounter.  EXAM: PELVIS - 1-2 VIEW  COMPARISON:  None.  FINDINGS: The bones are adequately mineralized for age. There is no evidence of acute fracture or dislocation. There are mild symmetric degenerative changes of the hips and sacroiliac joints. There  are greater degenerative changes in the lower lumbar spine associated with a convex right scoliosis.  IMPRESSION: No evidence of acute pelvic fracture or subluxation.   Electronically Signed   By: Terrance Adams M.D.   On: 04/23/2014 16:31   Ct Head Wo Contrast  04/23/2014   CLINICAL DATA:  Recent falls, increase lethargy  EXAM: CT HEAD WITHOUT CONTRAST  TECHNIQUE: Contiguous axial images were obtained from the base of the skull through the vertex without intravenous contrast.  COMPARISON:  12/17/2013  FINDINGS: Bony calvarium is intact. Chronic changes are noted throughout the paranasal  sinuses. Diffuse atrophic changes and chronic white matter ischemic change is again identified. No findings to suggest acute hemorrhage, acute infarction or space-occupying mass lesion are seen. Basal ganglia calcifications are noted.  IMPRESSION: Chronic atrophic and ischemic changes are noted. No acute abnormality is noted.   Electronically Signed   By: Terrance Adams M.D.   On: 04/23/2014 15:20   Mr Terrance Adams XB Contrast  04/24/2014   CLINICAL DATA:  81 year old male with altered mental status, confusion, frequent falls. Initial encounter.  EXAM: MRI HEAD WITHOUT AND WITH CONTRAST  TECHNIQUE: Multiplanar, multiecho pulse sequences of the brain and surrounding structures were obtained without and with intravenous contrast.  CONTRAST:  10mL MULTIHANCE GADOBENATE DIMEGLUMINE 529 MG/ML IV SOLN  COMPARISON:  Head CT without contrast 04/23/2014. Brain MRI 12/18/2013 and earlier.  FINDINGS: Stable cerebral volume since 2015. Major intracranial vascular flow voids are stable, with intracranial artery dolichoectasia again noted.  Study is intermittently degraded by motion artifact despite repeated imaging attempts.  No convincing restricted diffusion to suggest acute infarction. No midline shift, mass effect, evidence of mass lesion, ventriculomegaly, extra-axial collection or acute intracranial hemorrhage. Cervicomedullary junction and pituitary are within normal limits. Stable visualized cervical spine. Normal bone marrow signal.  Stable gray and white matter signal throughout the brain, with patchy nonspecific cerebral white matter signal changes and moderate for age T2 heterogeneity in the deep gray matter nuclei. Scattered small chronic lacunar infarcts suspected, including in the right superior cerebellum, and stable.  Visible internal auditory structures appear normal. Chronic paranasal sinus inflammation. Stable orbits soft tissues. Mastoids are clear. Visualized scalp soft tissues are within normal limits.   IMPRESSION: No acute intracranial abnormality identified.  Chronic intracranial artery dolichoectasia, small vessel disease, and paranasal sinusitis.   Electronically Signed   By: Terrance Adams M.D.   On: 04/24/2014 17:18    History of Present Illness: Pt was admitted 2/2 increased falls, weakness and altered mental status     Hospital Course: Metabolic encephalopathy - Acute confusion state likely multifactorial 2/2 chronic untreated sinusitis and progressive dementia/delerium - confusion improved w/ IV rocephin for sinus infection for 3 days. He will complete 1 week course for his sinusitis w/ cefdinir  bid w/ last dose on 2/1 evening.  PT/OT/speech recommended rehab w/ transition back to ALF, and he medically stable for discharge today . Skilled SLP may be beneficial at next level of care briefly to help transition pt into new environment using repetition to support generalization of skills.   Dementia - progressive but seems back to baseline  Sinusitis- cefdinir at discharge to complete 1 week course of therapy   Dehydration - resolved w/ IV hydration . Encourage PO hydration after discharge   Conjunctivitis - polytrim drops 4x daily in each eye for 1 week w/ last dose on 2/2  Protein calorie malnutrition - ensure shakes b/w meals       Day  of Discharge Exam BP 126/54 mmHg  Pulse 70  Temp(Src) 98.4 F (36.9 C) (Axillary)  Resp 16  Ht 5\' 9"  (1.753 m)  Wt 122 lb 5.7 oz (55.5 kg)  BMI 18.06 kg/m2  SpO2 97%  Physical Exam: General appearance: WF in NAD, resting comfortably, oriented to person  Eyes: matted w/ some conjuncitivitis B/L  Throat: oropharynx moist without erythema Resp: CTAB, no wr Cardio: rrr, no mrg GI: soft, non-tender; bowel sounds normal; no masses,  no organomegaly Extremities: no clubbing, cyanosis or edema  Discharge Labs:  Results for Terrance Adams, Terrance Adams (MRN [id number]) as of 04/28/2014 08:52  Ref. Range 04/24/2014 08:26 04/24/2014 16:51 04/25/2014  04:45 04/26/2014 04:09 04/27/2014 08:45  Sodium Latest Range: 135-145 mmol/L   135 135 136  Potassium Latest Range: 3.5-5.1 mmol/L   4.1 4.3 4.1  Chloride Latest Range: 96-112 mmol/L   105 101 102  CO2 Latest Range: 19-32 mmol/L   24 26 27   BUN Latest Range: 6-23 mg/dL   16 16 17   Creatinine Latest Range: 0.50-1.35 mg/dL   1.320.77 4.400.76 1.020.80  Calcium Latest Range: 8.4-10.5 mg/dL   8.5 8.8 8.5  GFR calc non Af Amer Latest Range: >90 mL/min   78 (L) 78 (L) 76 (L)  GFR calc Af Amer Latest Range: >90 mL/min   90 (L) >90 88 (L)  Glucose Latest Range: 70-99 mg/dL   91 725108 (H) 366121 (H)  Anion gap Latest Range: 5-15    6 8 7   Alkaline Phosphatase Latest Range: 39-117 U/L   71 72   Albumin Latest Range: 3.5-5.2 g/dL   3.2 (L) 3.3 (L)   AST Latest Range: 0-37 U/L   21 19   ALT Latest Range: 0-53 U/L   15 15   Total Protein Latest Range: 6.0-8.3 g/dL   6.3 6.5   Total Bilirubin Latest Range: 0.3-1.2 mg/dL   0.9 0.5   Cholesterol Latest Range: 0-200 mg/dL   440180    Triglycerides Latest Range: <150 mg/dL   79    HDL Latest Range: >39 mg/dL   57    LDL (calc) Latest Range: 0-99 mg/dL   347107 (H)    VLDL Latest Range: 0-40 mg/dL   16    Total CHOL/HDL Ratio Latest Units: RATIO   3.2    Vitamin B-12 Latest Range: 211-911 pg/mL 914 (H)      WBC Latest Range: 4.0-10.5 K/uL   10.5 11.1 (H) 10.9 (H)  RBC Latest Range: 4.22-5.81 MIL/uL   4.06 (L) 4.15 (L) 3.93 (L)  Hemoglobin Latest Range: 13.0-17.0 g/dL   42.511.8 (L) 95.611.9 (L) 38.711.4 (L)  HCT Latest Range: 39.0-52.0 %   36.3 (L) 36.5 (L) 35.0 (L)  MCV Latest Range: 78.0-100.0 fL   89.4 88.0 89.1  MCH Latest Range: 26.0-34.0 pg   29.1 28.7 29.0  MCHC Latest Range: 30.0-36.0 g/dL   56.432.5 33.232.6 95.132.6  RDW Latest Range: 11.5-15.5 %   14.5 14.3 14.3  Platelets Latest Range: 150-400 K/uL   307 301 296      Discharge instructions: Discharge Instructions    Diet - low sodium heart healthy    Complete by:  As directed      Increase activity slowly    Complete by:  As  directed           03-Skilled Nursing Facility   Disposition: [facility]   Follow-up Appts: [facility] after discharge from [facility]   Condition on Discharge: stable   Tests Needing Follow-up: none   Time spent in discharge (includes decision making & examination of pt): 45 minutes    Signed: Moniqua Engebretsen 04/28/2014, 8:51 AM

## 2014-05-01 ENCOUNTER — Non-Acute Institutional Stay (SKILLED_NURSING_FACILITY): Payer: Medicare Other | Admitting: Internal Medicine

## 2014-05-01 DIAGNOSIS — H109 Unspecified conjunctivitis: Secondary | ICD-10-CM

## 2014-05-01 DIAGNOSIS — R531 Weakness: Secondary | ICD-10-CM

## 2014-05-01 DIAGNOSIS — K5900 Constipation, unspecified: Secondary | ICD-10-CM

## 2014-05-01 DIAGNOSIS — J019 Acute sinusitis, unspecified: Secondary | ICD-10-CM

## 2014-05-01 DIAGNOSIS — N390 Urinary tract infection, site not specified: Secondary | ICD-10-CM

## 2014-05-01 DIAGNOSIS — G4700 Insomnia, unspecified: Secondary | ICD-10-CM

## 2014-05-01 DIAGNOSIS — D6489 Other specified anemias: Secondary | ICD-10-CM

## 2014-05-01 DIAGNOSIS — F0390 Unspecified dementia without behavioral disturbance: Secondary | ICD-10-CM

## 2014-05-01 DIAGNOSIS — E46 Unspecified protein-calorie malnutrition: Secondary | ICD-10-CM

## 2014-05-01 NOTE — Progress Notes (Signed)
Patient ID: Terrance JacquetCharles E Adams, male   DOB: 04/04/1921, 81 y.o.   MRN: [id number]     [facility]   PCP: [name],[name], MD  Code Status: DNR  No Known Allergies  Chief Complaint  Patient presents with  . New Admit To SNF     HPI:  81 year old patient is here for short term rehabilitation post hospital admission from 04/23/14-04/28/14 with frequent falls, generalized weakness and altered mental status. He was diagnosed with acute sinusitis. He was started on rocephin and iv fluids and made improvement. He has dementia, chronic constipation and hearing loss. He is seen in his room today. His brother is present during the visit  He feels his energy is coming back. He denies any concerns this visit He has been working with therapy team  Review of Systems:  Constitutional: Negative for fever, chills, diaphoresis.  HENT: Negative for headache, congestion, nasal discharge Eyes: Negative for eye pain, blurred vision, double vision and discharge.  Respiratory: Negative for cough, shortness of breath and wheezing.   Cardiovascular: Negative for chest pain, palpitations, leg swelling.  Gastrointestinal: Negative for heartburn, nausea, vomiting, abdominal pain Genitourinary: Negative for dysuria, flank pain.  Musculoskeletal: Negative for back pain, falls Skin: Negative for itching, rash.  Neurological: Negative for dizziness, tingling, focal weakness Psychiatric/Behavioral: Negative for depression. Has memory loss.    Past Medical History  Diagnosis Date  . UTI (urinary tract infection)     HX OF UTI   Past Surgical History  Procedure Laterality Date  . Hernia repair     Social History:   reports that he has never smoked. He has never used smokeless tobacco. He reports that he does not drink alcohol or use illicit drugs.  No family history on file.  Medications: Patient's Medications  New Prescriptions   No medications on file  Previous  Medications   ACETAMINOPHEN (TYLENOL) 325 MG TABLET    Take 2 tablets (650 mg total) by mouth every 4 (four) hours as needed for mild pain, fever or headache.   CEFDINIR (OMNICEF) 300 MG CAPSULE    Take 1 capsule by mouth twice daily w/ last dose on evening of 2/1   CYANOCOBALAMIN 500 MCG TABLET    Take 1 tablet (500 mcg total) by mouth daily.   FEEDING SUPPLEMENT, ENSURE COMPLETE, (ENSURE COMPLETE) LIQD    Take 237 mLs by mouth 3 (three) times daily between meals.   FERROUS SULFATE 325 (65 FE) MG TABLET    Take 325 mg by mouth at bedtime.   NITROFURANTOIN (MACRODANTIN) 50 MG CAPSULE    Take 50 mg by mouth at bedtime.   SENNA-DOCUSATE (SENOKOT-S) 8.6-50 MG PER TABLET    Take 1 tablet by mouth at bedtime.   SILVER SULFADIAZINE (SILVADENE) 1 % CREAM    Apply 1 application topically daily.   TRAZODONE (DESYREL) 50 MG TABLET    Take 0.5 tablets (25 mg total) by mouth at bedtime as needed for sleep.   TRIMETHOPRIM-POLYMYXIN B (POLYTRIM) OPHTHALMIC SOLUTION    Place 1 drop into both eyes 4 (four) times daily. Last dose is evening of 2/2  Modified Medications   No medications on file  Discontinued Medications   No medications on file     Physical Exam: Filed Vitals:   05/01/14 1942  BP: 114/60  Pulse: 74  Temp: 98.8 F (37.1 C)  Resp: 18  SpO2: 95%    General- elderly male, frail, in no acute distress Head-  normocephalic, atraumatic Nose- no maxillary or frontal sinus tenderness, no nasal discharge Eyes- bilateral conjunctivitis Throat- moist mucus membrane Neck- no cervical lymphadenopathy Cardiovascular- normal s1,s2, no murmurs, no leg edema Respiratory- bilateral clear to auscultation, no wheeze, no rhonchi, no crackles, no use of accessory muscles Abdomen- bowel sounds present, soft, non tender Musculoskeletal- able to move all 4 extremities, generalized weakness, on wheelchair Neurological- no focal deficit, alert and oriented to person only Skin- warm and dry Psychiatry-  normal mood and affect  Labs reviewed: Basic Metabolic Panel:  Recent Labs  16/10/96 1441  04/25/14 0445 04/26/14 0409 04/27/14 0845  NA 138  < > 135 135 136  K 3.8  < > 4.1 4.3 4.1  CL 103  < > 105 101 102  CO2 28  < > GLUCOSE 171*  < > 91 108* 121*  BUN 23  < > CREATININE 1.06  < > 0.77 0.76 0.80  CALCIUM 8.9  < > 8.5 8.8 8.5  MG 2.2  --   --   --   --   PHOS 4.0  --   --   --   --   < > = values in this interval not displayed. Liver Function Tests:  Recent Labs  04/24/14 0444 04/25/14 0445 04/26/14 0409  AST ALT ALKPHOS 59 71 72  BILITOT 0.3 0.9 0.5  PROT 5.6* 6.3 6.5  ALBUMIN 2.9* 3.2* 3.3*   No results for input(s): LIPASE, AMYLASE in the last 8760 hours.  Recent Labs  12/18/13 1222 04/24/14 0730  AMMONIA 14 19   CBC:  Recent Labs  04/24/14 0444 04/25/14 0445 04/26/14 0409 04/27/14 0845  WBC 9.2 10.5 11.1* 10.9*  NEUTROABS 5.9 7.2 6.7  --   HGB 10.5* 11.8* 11.9* 11.4*  HCT 33.0* 36.3* 36.5* 35.0*  MCV 89.9 89.4 88.0 89.1  PLT 259 307 301 296   Cardiac Enzymes:  Recent Labs  10/08/13 1916  10/10/13 0710 10/12/13 0119 12/18/13 1610 12/18/13 2150 12/19/13 0511  CKTOTAL 3940*  --  3621* 2172*  --   --   --   TROPONINI 0.35*  < >  --   --  <0.30 <0.30 <0.30  < > = values in this interval not displayed. BNP: Invalid input(s): POCBNP CBG:  Recent Labs  12/17/13 1222  GLUCAP 116*    Assessment/Plan  Generalized weakness Will have him work with physical therapy and occupational therapy team to help with gait training and muscle strengthening exercises.fall precautions. Skin care. Encourage to be out of bed.   Protein calorie malnutrition With dementia, decline anticipated. Continue medpass supplement, pressure ulcer prophylaxis, monitor po intake- to provide assistance if needed.monitor weight. Skin prep to bilateral heel for prevention. Continue b12 and iron supplements  Acute  sinusitis Completes course of omnicef today. Clinically stable  Anemia Continue b12 and iron supplement  Recurrent uti Continue macrodantin prohylactically to prevent uti  Conjunctivitis Continue polytrim drops qid for a week, completes on 05/02/14  Insomnia On Trazodone prn for sleep  Dementia No behavioral disturbances, to provide assistance with his ADLs, skin care, fall precuations, monitor po intake  Constipation Stable with current regimen of senokot s  Goals of care: short term rehabilitation   Labs/tests ordered: none  Family/ staff Communication: reviewed care plan with patient and nursing supervisor    Oneal Grout, MD  [facility] [phone] (Monday-Friday 8 am - 5 pm)  [phone] (afterhours)

## 2014-05-17 ENCOUNTER — Non-Acute Institutional Stay (SKILLED_NURSING_FACILITY): Payer: Medicare Other | Admitting: Adult Health

## 2014-05-17 DIAGNOSIS — E46 Unspecified protein-calorie malnutrition: Secondary | ICD-10-CM | POA: Diagnosis not present

## 2014-05-17 DIAGNOSIS — R531 Weakness: Secondary | ICD-10-CM

## 2014-05-17 DIAGNOSIS — G4700 Insomnia, unspecified: Secondary | ICD-10-CM | POA: Diagnosis not present

## 2014-05-17 DIAGNOSIS — K5900 Constipation, unspecified: Secondary | ICD-10-CM

## 2014-05-17 DIAGNOSIS — F0390 Unspecified dementia without behavioral disturbance: Secondary | ICD-10-CM

## 2014-05-17 DIAGNOSIS — D6489 Other specified anemias: Secondary | ICD-10-CM

## 2014-05-17 DIAGNOSIS — N390 Urinary tract infection, site not specified: Secondary | ICD-10-CM

## 2014-05-19 ENCOUNTER — Encounter: Payer: Self-pay | Admitting: Adult Health

## 2014-05-19 NOTE — Progress Notes (Signed)
Patient ID: Terrance Adams, male   DOB: [birth year], 81 y.o.   MRN: [id number]   05/17/14  Facility:  Nursing Home Location:  [facility] Room Number: 903-1 LEVEL OF CARE:  SNF (31)   Chief Complaint  Patient presents with  . Discharge Note    Generalized weakness, protein calorie malnutrition, dementia, recurrent UTI, constipation, insomnia and anemia    HISTORY OF PRESENT ILLNESS:  This is a 81 year old male who is for discharge to ALF with home health PT, OT and ST. He has been admitted to [facility] on 04/28/14 from [facility]. He has been having frequent falls, generalized weakness and altered mental status and was brought to hospital. He was treated for acute sinusitis and dehydration. He has past medical history of anemia and UTI. Patient was admitted to this facility for short-term rehabilitation after the patient's recent hospitalization.  Patient has completed SNF rehabilitation and therapy has cleared the patient for discharge.  PAST MEDICAL HISTORY:  Past Medical History  Diagnosis Date  . UTI (urinary tract infection)     HX OF UTI    CURRENT MEDICATIONS: Reviewed per MAR/see medication list  No Known Allergies   REVIEW OF SYSTEMS:  GENERAL: no change in appetite, no fatigue, no weight changes, no fever, chills or weakness RESPIRATORY: no cough, SOB, DOE, wheezing, hemoptysis CARDIAC: no chest pain, edema or palpitations GI: no abdominal pain, diarrhea, constipation, heart burn, nausea or vomiting  PHYSICAL EXAMINATION  GENERAL: no acute distress, normal body habitus EYES: conjunctivae slight redness, no discharge NECK: supple, trachea midline, no neck masses, no thyroid tenderness, no thyromegaly LYMPHATICS: no LAN in the neck, no supraclavicular LAN RESPIRATORY: breathing is even & unlabored, BS CTAB CARDIAC: RRR, no murmur,no extra heart sounds, no edema GI: abdomen soft, normal BS, no masses, no tenderness, no  hepatomegaly, no splenomegaly EXTREMITIES: Able to move 4 extremities; uses wheelchair PSYCHIATRIC: the patient is alert & oriented to person, affect & behavior appropriate  LABS/RADIOLOGY: Labs reviewed: Basic Metabolic Panel:  Recent Labs  40/98/11 1441  04/25/14 0445 04/26/14 0409 04/27/14 0845  NA 138  < > 135 135 136  K 3.8  < > 4.1 4.3 4.1  CL 103  < > 105 101 102  CO2 28  < > GLUCOSE 171*  < > 91 108* 121*  BUN 23  < > CREATININE 1.06  < > 0.77 0.76 0.80  CALCIUM 8.9  < > 8.5 8.8 8.5  MG 2.2  --   --   --   --   PHOS 4.0  --   --   --   --   < > = values in this interval not displayed.  Liver Function Tests:  Recent Labs  04/24/14 0444 04/25/14 0445 04/26/14 0409  AST ALT ALKPHOS 59 71 72  BILITOT 0.3 0.9 0.5  PROT 5.6* 6.3 6.5  ALBUMIN 2.9* 3.2* 3.3*    Recent Labs  12/18/13 1222 04/24/14 0730  AMMONIA 14 19   CBC:  Recent Labs  04/24/14 0444 04/25/14 0445 04/26/14 0409 04/27/14 0845  WBC 9.2 10.5 11.1* 10.9*  NEUTROABS 5.9 7.2 6.7  --   HGB 10.5* 11.8* 11.9* 11.4*  HCT 33.0* 36.3* 36.5* 35.0*  MCV 89.9 89.4 88.0 89.1  PLT 259 307 301 296   Lipid Panel:  Recent Labs  12/18/13 0421 04/25/14 0445  HDL 68 57   Cardiac Enzymes:  Recent Labs  10/08/13 1916  10/10/13 0710 10/12/13 0119 12/18/13 1610 12/18/13 2150 12/19/13 0511  CKTOTAL 3940*  --  3621* 2172*  --   --   --   TROPONINI 0.35*  < >  --   --  <0.30 <0.30 <0.30  < > = values in this interval not displayed.  CBG:  Recent Labs  12/17/13 1222  GLUCAP 116*    Dg Chest 2 View  04/23/2014   CLINICAL DATA:  Three falls in the last 3 days. Lethargy. Poor ambulation.  EXAM: CHEST  2 VIEW  COMPARISON:  Multiple exams, including 12/18/2013  FINDINGS: Elevated left hemidiaphragm. Tortuosity and atherosclerotic calcification of the aortic arch. Mild enlargement of the cardiopericardial silhouette. Faint Kerley B-lines at the right  lung base.  Eliminated acromion humeral distance on the right. Bony demineralization. Old healed right-sided rib fractures.  Continued bandlike opacities along both hemidiaphragms, likely from scarring or atelectasis. Vague density over the left chest is probably a skin fold.  Thoracolumbar spondylosis with poor definition of the vertebral bodies on the lateral projection due to motion.  IMPRESSION: 1. Mild cardiomegaly with faint Kerley B-lines which could reflect low-level interstitial edema. 2. Mild bibasilar atelectasis or scarring. Elevated left hemidiaphragm. 3. Atherosclerosis. 4. Suspected chronic rotator cuff tear on the right.   Electronically Signed   By: Herbie BaltimoreWalt  Liebkemann M.D.   On: 04/23/2014 16:32   Dg Pelvis 1-2 Views  04/23/2014   CLINICAL DATA:  Multiple recent falls over the last 3 days. Lethargy. Poor ambulation. Initial encounter.  EXAM: PELVIS - 1-2 VIEW  COMPARISON:  None.  FINDINGS: The bones are adequately mineralized for age. There is no evidence of acute fracture or dislocation. There are mild symmetric degenerative changes of the hips and sacroiliac joints. There are greater degenerative changes in the lower lumbar spine associated with a convex right scoliosis.  IMPRESSION: No evidence of acute pelvic fracture or subluxation.   Electronically Signed   By: Roxy HorsemanBill  Veazey M.D.   On: 04/23/2014 16:31   Ct Head Wo Contrast  04/23/2014   CLINICAL DATA:  Recent falls, increase lethargy  EXAM: CT HEAD WITHOUT CONTRAST  TECHNIQUE: Contiguous axial images were obtained from the base of the skull through the vertex without intravenous contrast.  COMPARISON:  12/17/2013  FINDINGS: Bony calvarium is intact. Chronic changes are noted throughout the paranasal sinuses. Diffuse atrophic changes and chronic white matter ischemic change is again identified. No findings to suggest acute hemorrhage, acute infarction or space-occupying mass lesion are seen. Basal ganglia calcifications are noted.   IMPRESSION: Chronic atrophic and ischemic changes are noted. No acute abnormality is noted.   Electronically Signed   By: Alcide CleverMark  Lukens M.D.   On: 04/23/2014 15:20   Mr Laqueta JeanBrain W WUWo Contrast  04/24/2014   CLINICAL DATA:  81 year old male with altered mental status, confusion, frequent falls. Initial encounter.  EXAM: MRI HEAD WITHOUT AND WITH CONTRAST  TECHNIQUE: Multiplanar, multiecho pulse sequences of the brain and surrounding structures were obtained without and with intravenous contrast.  CONTRAST:  10mL MULTIHANCE GADOBENATE DIMEGLUMINE 529 MG/ML IV SOLN  COMPARISON:  Head CT without contrast 04/23/2014. Brain MRI 12/18/2013 and earlier.  FINDINGS: Stable cerebral volume since 2015. Major intracranial vascular flow voids are stable, with intracranial artery dolichoectasia again noted.  Study is intermittently degraded by motion artifact despite repeated imaging attempts.  No convincing restricted diffusion to suggest acute infarction. No midline shift, mass effect,  evidence of mass lesion, ventriculomegaly, extra-axial collection or acute intracranial hemorrhage. Cervicomedullary junction and pituitary are within normal limits. Stable visualized cervical spine. Normal bone marrow signal.  Stable gray and white matter signal throughout the brain, with patchy nonspecific cerebral white matter signal changes and moderate for age T2 heterogeneity in the deep gray matter nuclei. Scattered small chronic lacunar infarcts suspected, including in the right superior cerebellum, and stable.  Visible internal auditory structures appear normal. Chronic paranasal sinus inflammation. Stable orbits soft tissues. Mastoids are clear. Visualized scalp soft tissues are within normal limits.  IMPRESSION: No acute intracranial abnormality identified.  Chronic intracranial artery dolichoectasia, small vessel disease, and paranasal sinusitis.   Electronically Signed   By: Augusto Gamble M.D.   On: 04/24/2014 17:18     ASSESSMENT/PLAN:  Generalized weakness - for home health PT, OT and ST Protein calorie malnutrition - continue supplementation Dementia without behavioral disturbance - stable Recurrent UTI - continue Macrodantin 50 mg 1 capsule by mouth daily at bedtime Constipation - continue senna S1 tab by mouth daily at bedtime Insomnia - continue trazodone 50 mg take 1/2 tab = 25 mg by mouth daily at bedtime Anemia - hemoglobin 11.4; continue first sulfate 325 mg 1 tab by mouth daily at bedtime    I have filled out patient's discharge paperwork and written prescriptions.  Patient will receive home health PT, OT and ST.  Total discharge time: Greater than 30 minutes  Discharge time involved coordination of the discharge process with social worker, nursing staff and therapy department. Medical justification for home health services/ verified.    [facility], NP [employer] [phone]

## 2014-05-30 ENCOUNTER — Emergency Department (HOSPITAL_COMMUNITY): Payer: Medicare Other

## 2014-05-30 ENCOUNTER — Emergency Department (HOSPITAL_COMMUNITY)
Admission: EM | Admit: 2014-05-30 | Discharge: 2014-05-31 | Disposition: A | Discharge status: Home / Self Care | Payer: Medicare Other | Attending: Emergency Medicine | Admitting: Emergency Medicine

## 2014-05-30 ENCOUNTER — Encounter (HOSPITAL_COMMUNITY): Payer: Self-pay | Admitting: Emergency Medicine

## 2014-05-30 DIAGNOSIS — Z79899 Other long term (current) drug therapy: Secondary | ICD-10-CM | POA: Diagnosis not present

## 2014-05-30 DIAGNOSIS — Y9289 Other specified places as the place of occurrence of the external cause: Secondary | ICD-10-CM | POA: Insufficient documentation

## 2014-05-30 DIAGNOSIS — W01198A Fall on same level from slipping, tripping and stumbling with subsequent striking against other object, initial encounter: Secondary | ICD-10-CM | POA: Insufficient documentation

## 2014-05-30 DIAGNOSIS — S0990XA Unspecified injury of head, initial encounter: Secondary | ICD-10-CM | POA: Diagnosis not present

## 2014-05-30 DIAGNOSIS — Z87440 Personal history of urinary (tract) infections: Secondary | ICD-10-CM | POA: Diagnosis not present

## 2014-05-30 DIAGNOSIS — Y998 Other external cause status: Secondary | ICD-10-CM | POA: Insufficient documentation

## 2014-05-30 DIAGNOSIS — Y939 Activity, unspecified: Secondary | ICD-10-CM | POA: Insufficient documentation

## 2014-05-30 DIAGNOSIS — W19XXXA Unspecified fall, initial encounter: Secondary | ICD-10-CM

## 2014-05-30 DIAGNOSIS — F0390 Unspecified dementia without behavioral disturbance: Secondary | ICD-10-CM | POA: Diagnosis not present

## 2014-05-30 NOTE — ED Notes (Signed)
Per EMS patient is coming from [facility] on [address]. Patient trip over a chair in his room and fell and hit his head. Patient is not on any blood thinners and denies any LOC. Patient denies any Neck or back pain with EMS. Vitals 138/82 HR 78 RR 18.

## 2014-05-30 NOTE — ED Provider Notes (Signed)
CSN: [id number]     Arrival date & time 05/30/14  2207 History   First MD Initiated Contact with Patient 05/30/14 2234     Chief Complaint  Patient presents with  . Fall     (Consider location/radiation/quality/duration/timing/severity/associated sxs/prior Treatment) HPI Comments: [facility] is a resident of [facility].  He was transported by EMS after a fall report is that he fell over a chair in his room, hitting his head.  Patient is not complaining of any pain at this point, but does have a history of dementia.  Patient is a 81 y.o. male presenting with fall. The history is provided by the nursing home, the EMS personnel and the patient.  Fall This is a new problem. The current episode started today. The problem occurs intermittently. The problem has been unchanged. Pertinent negatives include no arthralgias, chills, fever, headaches, myalgias, nausea, neck pain, numbness, vomiting or weakness. Nothing aggravates the symptoms. He has tried nothing for the symptoms. The treatment provided no relief.    Past Medical History  Diagnosis Date  . UTI (urinary tract infection)     HX OF UTI   Past Surgical History  Procedure Laterality Date  . Hernia repair     History reviewed. No pertinent family history. History  Substance Use Topics  . Smoking status: Never Smoker   . Smokeless tobacco: Never Used  . Alcohol Use: No    Review of Systems  Constitutional: Negative for fever and chills.  Gastrointestinal: Negative for nausea and vomiting.  Musculoskeletal: Negative for myalgias, arthralgias and neck pain.  Neurological: Negative for dizziness, weakness, numbness and headaches.  All other systems reviewed and are negative.     Allergies  Review of patient's allergies indicates no known allergies.  Home Medications   Prior to Admission medications   Medication Sig Start Date End Date Taking? Authorizing Provider  acetaminophen (TYLENOL) 325 MG tablet Take 2  tablets (650 mg total) by mouth every 4 (four) hours as needed for mild pain, fever or headache. 12/21/13  Yes Osvaldo ShipperGokul Krishnan, MD  cyanocobalamin 500 MCG tablet Take 1 tablet (500 mcg total) by mouth daily. 12/21/13  Yes Osvaldo ShipperGokul Krishnan, MD  ferrous sulfate 325 (65 FE) MG tablet Take 325 mg by mouth at bedtime.   Yes Historical Provider, MD  Multiple Vitamins-Minerals (DECUBI-VITE PO) Take 1 tablet by mouth daily.   Yes Historical Provider, MD  nitrofurantoin (MACRODANTIN) 50 MG capsule Take 50 mg by mouth at bedtime.   Yes Historical Provider, MD  senna-docusate (SENOKOT-S) 8.6-50 MG per tablet Take 1 tablet by mouth at bedtime. 12/21/13  Yes Osvaldo ShipperGokul Krishnan, MD  traZODone (DESYREL) 50 MG tablet Take 0.5 tablets (25 mg total) by mouth at bedtime as needed for sleep. 12/21/13  Yes Osvaldo ShipperGokul Krishnan, MD  UNABLE TO FIND Take 120 mLs by mouth 3 (three) times daily. Med Name: MedPass   Yes Historical Provider, MD  cefdinir (OMNICEF) 300 MG capsule Take 1 capsule by mouth twice daily w/ last dose on evening of 2/1 Patient not taking: Reported on 05/30/2014 04/28/14   Alysia PennaScott Holwerda, MD  feeding supplement, ENSURE COMPLETE, (ENSURE COMPLETE) LIQD Take 237 mLs by mouth 3 (three) times daily between meals. Patient not taking: Reported on 05/30/2014 04/28/14   Alysia PennaScott Holwerda, MD  silver sulfADIAZINE (SILVADENE) 1 % cream Apply 1 application topically daily. Patient not taking: Reported on 04/23/2014 11/30/13   Ovid CurdMatthew Wagoner, DPM  trimethoprim-polymyxin b (POLYTRIM) ophthalmic solution Place 1 drop into both eyes 4 (  four) times daily. Last dose is evening of 2/2 Patient not taking: Reported on 05/30/2014 04/28/14   Alysia Penna, MD   BP 127/69 mmHg  Pulse 69  Temp(Src) 97.8 F (36.6 C) (Oral)  Resp 18  SpO2 98% Physical Exam  Constitutional: He appears well-developed and well-nourished.  HENT:  Head: Normocephalic.  Eyes: Pupils are equal, round, and reactive to light.  Patient is having a hard time keeping his  eyes open during interview and examination  Neck: No spinous process tenderness and no muscular tenderness present. Decreased range of motion present. No tracheal deviation present.  Cardiovascular: Normal rate and regular rhythm.   Pulmonary/Chest: Effort normal and breath sounds normal.  Abdominal: Soft. He exhibits no distension. There is no tenderness.  Musculoskeletal: He exhibits no edema or tenderness.  Patient does not complaining any pain with passive range of motion of all extremities, no obvious deformity, no leg shortening or rotation  Neurological: He is alert.  Skin: Skin is warm.  Vitals reviewed.   ED Course  Procedures (including critical care time) Labs Review Labs Reviewed - No data to display  Imaging Review Ct Head Wo Contrast  05/31/2014   CLINICAL DATA:  Fall with head injury.  EXAM: CT HEAD WITHOUT CONTRAST  CT CERVICAL SPINE WITHOUT CONTRAST  TECHNIQUE: Multidetector CT imaging of the head and cervical spine was performed following the standard protocol without intravenous contrast. Multiplanar CT image reconstructions of the cervical spine were also generated.  COMPARISON:  04/23/2014 head CT  FINDINGS: CT HEAD FINDINGS  Skull and Sinuses:Chronic sinusitis with diffuse mucosal thickening and a chronic fluid level in the thick walled right maxillary sinus. There is sclerosis in the right mastoid tip without air cell opacification.  No indication of acute fracture.  Orbits: Bilateral cataract resection.  No traumatic findings  Brain: No evidence of acute infarction, hemorrhage, hydrocephalus, or mass lesion/mass effect. There is generalized brain atrophy which is stable over multiple years. Chronic small-vessel disease with ischemic gliosis throughout the periventricular white matter. Indistinct deep gray nuclei from dilated perivascular spaces. Chronic lacunar infarct in the central left thalamus.  CT CERVICAL SPINE FINDINGS  There is no evidence of cervical spine fracture.  C4-5 and C7-T1 anterolisthesis, up to 4 mm at the cervicothoracic junction, is associated with advanced facet arthropathy, the most likely cause. There is no gross cervical canal hematoma or prevertebral edema.  Facet degeneration is advanced and diffuse, with ankylosis across the C2-3 and C5-6 levels. Diffuse degenerative disc disease, with narrowing severe at C5-6 to C7-T1. Chronic appearing cystic change in the posterior dens, without ligament ossification or clear pannus.  IMPRESSION: 1. No evidence of acute intracranial injury or cervical spine fracture. 2. C4-5 and C7-T1 anterolisthesis, likely from advanced facet arthropathy. 3. Brain atrophy and chronic small vessel disease. 4. Chronic sinusitis.   Electronically Signed   By: Marnee Spring M.D.   On: 05/31/2014 00:59   Ct Cervical Spine Wo Contrast  05/31/2014   CLINICAL DATA:  Fall with head injury.  EXAM: CT HEAD WITHOUT CONTRAST  CT CERVICAL SPINE WITHOUT CONTRAST  TECHNIQUE: Multidetector CT imaging of the head and cervical spine was performed following the standard protocol without intravenous contrast. Multiplanar CT image reconstructions of the cervical spine were also generated.  COMPARISON:  04/23/2014 head CT  FINDINGS: CT HEAD FINDINGS  Skull and Sinuses:Chronic sinusitis with diffuse mucosal thickening and a chronic fluid level in the thick walled right maxillary sinus. There is sclerosis in the right mastoid  tip without air cell opacification.  No indication of acute fracture.  Orbits: Bilateral cataract resection.  No traumatic findings  Brain: No evidence of acute infarction, hemorrhage, hydrocephalus, or mass lesion/mass effect. There is generalized brain atrophy which is stable over multiple years. Chronic small-vessel disease with ischemic gliosis throughout the periventricular white matter. Indistinct deep gray nuclei from dilated perivascular spaces. Chronic lacunar infarct in the central left thalamus.  CT CERVICAL SPINE FINDINGS   There is no evidence of cervical spine fracture. C4-5 and C7-T1 anterolisthesis, up to 4 mm at the cervicothoracic junction, is associated with advanced facet arthropathy, the most likely cause. There is no gross cervical canal hematoma or prevertebral edema.  Facet degeneration is advanced and diffuse, with ankylosis across the C2-3 and C5-6 levels. Diffuse degenerative disc disease, with narrowing severe at C5-6 to C7-T1. Chronic appearing cystic change in the posterior dens, without ligament ossification or clear pannus.  IMPRESSION: 1. No evidence of acute intracranial injury or cervical spine fracture. 2. C4-5 and C7-T1 anterolisthesis, likely from advanced facet arthropathy. 3. Brain atrophy and chronic small vessel disease. 4. Chronic sinusitis.   Electronically Signed   By: Marnee Spring M.D.   On: 05/31/2014 00:59     EKG Interpretation   Date/Time:  Tuesday May 30 2014 22:17:50 EST Ventricular Rate:  70 PR Interval:  159 QRS Duration: 89 QT Interval:  376 QTC Calculation: 406 R Axis:   57 Text Interpretation:  Sinus rhythm No significant change since last  tracing Confirmed by HARRISON  MD, FORREST ([id number]) on 05/30/2014 11:26:21 PM      MDM  CT scan of head and neck are both fractures or intracranial bleed.  Patient has been sent back to his nursing facility Final diagnoses:  Ernesto Rutherford, NP 05/31/14 0112  Arman Filter, NP 05/31/14 2130  Purvis Sheffield, MD 06/01/14 1039

## 2014-05-31 NOTE — ED Notes (Signed)
Pt made aware to return if symptoms worsen or if any life threatening symptoms occur.  Attempted report to [facility].

## 2014-05-31 NOTE — ED Notes (Signed)
PTar called  

## 2014-05-31 NOTE — Discharge Instructions (Signed)
Fall Prevention in Hospitals As a hospital patient, your condition and the treatments you receive can increase your risk for falls. Some additional risk factors for falls in a hospital include:  Being in an unfamiliar environment.  Being on bed rest.  Your surgery.  Taking certain medicines.  Your tubing requirements, such as intravenous (IV) therapy or catheters. It is important that you learn how to decrease fall risks while at the hospital. Below are important tips that can help prevent falls. SAFETY TIPS FOR PREVENTING FALLS Talk about your risk of falling.  Ask your caregiver why you are at risk for falling. Is it your medicine, illness, tubing placement, or something else?  Make a plan with your caregiver to keep you safe from falls.  Ask your caregiver or pharmacist about side effect of your medicines. Some medicines can make you dizzy or affect your coordination. Ask for help.  Ask for help before getting out of bed. You may need to press your call button.  Ask for assistance in getting you safely to the toilet.  Ask for a walker or cane to be put at your bedside. Ask that most of the side rails on your bed be placed up before your caregiver leaves the room.  Ask family or friends to sit with you.  Ask for things that are out of your reach, such as your glasses, hearing aids, telephone, bedside table, or call button. Follow these tips to avoid falling:  Stay lying or seated, rather than standing, while waiting for help.  Wear rubber-soled slippers or shoes whenever you walk in the hospital.  Avoid quick, sudden movements.  Change positions slowly.  Sit on the side of your bed before standing.  Stand up slowly and wait before you start to walk.  Let your caregiver know if there is a spill on the floor.  Pay careful attention to the medical equipment, electrical cords, and tubes around you.  When you need help, use your call button by your bed or in the  bathroom. Wait for one of your caregivers to help you.  If you feel dizzy or unsure of your footing, return to bed and wait for assistance.  Avoid being distracted by the TV, telephone, or another person in your room.  Do not lean or support yourself on rolling objects, such as IV poles or bedside tables. Document Released: 03/14/2000 Document Revised: 03/03/2012 Document Reviewed: 11/23/2011 [facility] Patient Information 2015 [employer] [place]. This information is not intended to replace advice given to you by your health care provider. Make sure you discuss any questions you have with your health care provider. Tonight, [name] head and neck or CT after his fall, over within normal limits.  Please try to provide a safe environment in his room

## 2014-08-10 ENCOUNTER — Emergency Department (HOSPITAL_COMMUNITY): Payer: Medicare Other

## 2014-08-10 ENCOUNTER — Emergency Department (HOSPITAL_COMMUNITY)
Admission: EM | Admit: 2014-08-10 | Discharge: 2014-08-11 | Disposition: A | Discharge status: Home / Self Care | Payer: Medicare Other | Attending: Emergency Medicine | Admitting: Emergency Medicine

## 2014-08-10 ENCOUNTER — Encounter (HOSPITAL_COMMUNITY): Payer: Self-pay | Admitting: Emergency Medicine

## 2014-08-10 DIAGNOSIS — Z87440 Personal history of urinary (tract) infections: Secondary | ICD-10-CM | POA: Diagnosis not present

## 2014-08-10 DIAGNOSIS — Y9389 Activity, other specified: Secondary | ICD-10-CM | POA: Diagnosis not present

## 2014-08-10 DIAGNOSIS — S161XXA Strain of muscle, fascia and tendon at neck level, initial encounter: Secondary | ICD-10-CM | POA: Insufficient documentation

## 2014-08-10 DIAGNOSIS — Y92128 Other place in nursing home as the place of occurrence of the external cause: Secondary | ICD-10-CM | POA: Diagnosis not present

## 2014-08-10 DIAGNOSIS — S0990XA Unspecified injury of head, initial encounter: Secondary | ICD-10-CM | POA: Insufficient documentation

## 2014-08-10 DIAGNOSIS — Y998 Other external cause status: Secondary | ICD-10-CM | POA: Insufficient documentation

## 2014-08-10 DIAGNOSIS — Z79899 Other long term (current) drug therapy: Secondary | ICD-10-CM | POA: Insufficient documentation

## 2014-08-10 DIAGNOSIS — W06XXXA Fall from bed, initial encounter: Secondary | ICD-10-CM | POA: Diagnosis not present

## 2014-08-10 NOTE — ED Notes (Signed)
[facility] presents with a [age] [facility] male from [facility] with [facility].  Staff placed pt in bed per [facility]; however, pt attempted to get out of bed by himself and fell unwitnessed.  No obvious deformities, No c/o pain, hx of dementia, right sided abrasion on head but appears to be an older injury.  Pt is oriented to self.  Vitals stable.  CBG 154.  Walks normally with assistance and walker.  No blood thinners.

## 2014-08-10 NOTE — ED Provider Notes (Signed)
CSN: [id number]     Arrival date & time 08/10/14  1903 History   First MD Initiated Contact with Patient 08/10/14 1948     Chief Complaint  Patient presents with  . Fall     Patient is a 81 y.o. male presenting with fall. The history is provided by the patient.  Fall This is a new problem. Episode onset: earlier tonight. The problem occurs constantly. Pertinent negatives include no chest pain and no abdominal pain. Associated symptoms comments: Mild HA . The symptoms are aggravated by walking. The symptoms are relieved by rest.  pt presents from nursing facility for fall Per nursing director (spoke to her via phone) Pt tried to walk on his own and he fell back hitting his head No LOC She reports he did report his head hurt but he currently denies this at this time No neck or back pain No cp No extremity injury Pt reports he feels well currently Nursing director at [facility] reports he is usually alert/interactive and can ambulate with assistance He is not on anticoagulants  Past Medical History  Diagnosis Date  . UTI (urinary tract infection)     HX OF UTI   Past Surgical History  Procedure Laterality Date  . Hernia repair     History reviewed. No pertinent family history. History  Substance Use Topics  . Smoking status: Never Smoker   . Smokeless tobacco: Never Used  . Alcohol Use: No    Review of Systems  Cardiovascular: Negative for chest pain.  Gastrointestinal: Negative for abdominal pain.  Musculoskeletal: Negative for back pain and neck pain.  All other systems reviewed and are negative.     Allergies  Review of patient's allergies indicates no known allergies.  Home Medications   Prior to Admission medications   Medication Sig Start Date End Date Taking? Authorizing Provider  cyanocobalamin 500 MCG tablet Take 1 tablet (500 mcg total) by mouth daily. 12/21/13  Yes Osvaldo ShipperGokul Krishnan, MD  ferrous sulfate 325 (65 FE) MG tablet Take 325 mg by mouth at  bedtime.   Yes Historical Provider, MD  Multiple Vitamins-Minerals (DECUBI-VITE PO) Take 1 tablet by mouth daily.   Yes Historical Provider, MD  nitrofurantoin (MACRODANTIN) 50 MG capsule Take 50 mg by mouth at bedtime.   Yes Historical Provider, MD  senna-docusate (SENOKOT-S) 8.6-50 MG per tablet Take 1 tablet by mouth at bedtime. Patient taking differently: Take 1 tablet by mouth at bedtime as needed for mild constipation.  12/21/13  Yes Osvaldo ShipperGokul Krishnan, MD  UNABLE TO FIND Take 120 mLs by mouth 3 (three) times daily. Med Name: MedPass   Yes Historical Provider, MD  acetaminophen (TYLENOL) 325 MG tablet Take 2 tablets (650 mg total) by mouth every 4 (four) hours as needed for mild pain, fever or headache. 12/21/13   Osvaldo ShipperGokul Krishnan, MD  cefdinir (OMNICEF) 300 MG capsule Take 1 capsule by mouth twice daily w/ last dose on evening of 2/1 Patient not taking: Reported on 05/30/2014 04/28/14   Alysia PennaScott Holwerda, MD  feeding supplement, ENSURE COMPLETE, (ENSURE COMPLETE) LIQD Take 237 mLs by mouth 3 (three) times daily between meals. Patient not taking: Reported on 05/30/2014 04/28/14   Alysia PennaScott Holwerda, MD  silver sulfADIAZINE (SILVADENE) 1 % cream Apply 1 application topically daily. Patient not taking: Reported on 04/23/2014 11/30/13   Vivi BarrackMatthew R Wagoner, DPM  traZODone (DESYREL) 50 MG tablet Take 0.5 tablets (25 mg total) by mouth at bedtime as needed for sleep. 12/21/13   Osvaldo ShipperGokul Krishnan,  MD  trimethoprim-polymyxin b (POLYTRIM) ophthalmic solution Place 1 drop into both eyes 4 (four) times daily. Last dose is evening of 2/2 Patient not taking: Reported on 05/30/2014 04/28/14   Alysia Penna, MD   BP 125/61 mmHg  Pulse 72  Resp 20  SpO2 96% Physical Exam CONSTITUTIONAL: elderly, frail hard of hearing but no distress HEAD: Normocephalic/atraumatic, chronic skin changes noted but no signs of trauma EYES: EOMI ENMT: Mucous membranes moist, No evidence of facial/nasal trauma NECK: supple no meningeal  signs SPINE/BACK:entire spine nontender, No bruising/crepitance/stepoffs noted to spine CV: S1/S2 noted LUNGS: Lungs are clear to auscultation bilaterally, no apparent distress ABDOMEN: soft, nontender, no rebound or guarding, bowel sounds noted throughout abdomen NEURO: Pt is awake/alert/appropriate, moves all extremitiesx4.  No facial droop.   EXTREMITIES: pulses normal/equal, full ROM, All other extremities/joints palpated/ranged and nontender SKIN: warm, color normal PSYCH: no abnormalities of mood noted, alert and oriented to situation  ED Course  Procedures  8:37 PM Will ambulate and reassess No signs of trauma at this time 9:47 PM Pt ambulated in the ED but now reporting neck pain  Will obtain CT imaging due to fall and image cspine/head due to fall and hitting his head and his age 81:09 PM Imaging negative Will d/c home  Labs Review Labs Reviewed - No data to display  Imaging Review Ct Head Wo Contrast  08/10/2014   CLINICAL DATA:  Fall.  EXAM: CT HEAD WITHOUT CONTRAST  CT CERVICAL SPINE WITHOUT CONTRAST  TECHNIQUE: Multidetector CT imaging of the head and cervical spine was performed following the standard protocol without intravenous contrast. Multiplanar CT image reconstructions of the cervical spine were also generated.  COMPARISON:  CT head and cervical spine 05/31/2014  FINDINGS: CT HEAD FINDINGS  Diffuse cerebral atrophy. Mild ventricular dilatation consistent with central atrophy. Low-attenuation changes in the deep white matter consistent with small vessel ischemia. Basal ganglia calcifications. Small subcutaneous scalp hematoma over the left frontal region. No mass effect or midline shift. No abnormal extra-axial fluid collections. Gray-white matter junctions are distinct. Basal cisterns are not effaced. No evidence of acute intracranial hemorrhage. No depressed skull fractures. Mucosal thickening throughout the paranasal sinuses. Retention cyst in the left frontal sinus.  Air-fluid level in the right maxillary antrum. Prominent trabecular architecture of the right maxilla and sphenoid bone with sclerosis in the mastoid air cells. 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 Vascular calcifications.  CT CERVICAL SPINE FINDINGS  Diffuse bone demineralization. Diffuse degenerative changes throughout the cervical spine and facet joints as well as at C1 to. Degenerative cysts in the base of the odontoid process. There is mild anterior subluxation of C4 on C5 and of C7 on T1. Appearance is similar to prior study and likely due to degenerative change. No vertebral compression deformities. No prevertebral soft tissue swelling. C1-2 articulation appears intact. No focal bone destruction. Bone cortex appears intact. Vascular calcifications. Scarring in the lung apices.  IMPRESSION: No acute intracranial abnormalities. Chronic atrophy and small vessel ischemic changes. 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  Prominent diffuse degenerative change throughout the cervical spine. Alignment is unchanged since prior study with mild anterior subluxation of C4 on C5 and C7 on T1, likely degenerative. No acute displaced fractures are identified.   Electronically Signed   By: Burman Nieves M.D.   On: 08/10/2014 22:44   Ct Cervical Spine Wo Contrast  08/10/2014   CLINICAL DATA:  Fall.  EXAM: CT HEAD WITHOUT CONTRAST  CT CERVICAL SPINE WITHOUT CONTRAST  TECHNIQUE: Multidetector CT imaging of the head and cervical spine was performed following the standard protocol without intravenous contrast. Multiplanar CT image reconstructions of the cervical spine were also generated.  COMPARISON:  CT head and cervical spine 05/31/2014  FINDINGS: CT HEAD FINDINGS  Diffuse cerebral atrophy. Mild ventricular dilatation consistent with central atrophy. Low-attenuation changes in the deep white matter consistent with small vessel ischemia. Basal ganglia calcifications. Small subcutaneous scalp hematoma over  the left frontal region. No mass effect or midline shift. No abnormal extra-axial fluid collections. Gray-white matter junctions are distinct. Basal cisterns are not effaced. No evidence of acute intracranial hemorrhage. No depressed skull fractures. Mucosal thickening throughout the paranasal sinuses. Retention cyst in the left frontal sinus. Air-fluid level in the right maxillary antrum. Prominent trabecular architecture of the right maxilla and sphenoid bone with sclerosis in the mastoid air cells. 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 Vascular calcifications.  CT CERVICAL SPINE FINDINGS  Diffuse bone demineralization. Diffuse degenerative changes throughout the cervical spine and facet joints as well as at C1 to. Degenerative cysts in the base of the odontoid process. There is mild anterior subluxation of C4 on C5 and of C7 on T1. Appearance is similar to prior study and likely due to degenerative change. No vertebral compression deformities. No prevertebral soft tissue swelling. C1-2 articulation appears intact. No focal bone destruction. Bone cortex appears intact. Vascular calcifications. Scarring in the lung apices.  IMPRESSION: No acute intracranial abnormalities. Chronic atrophy and small vessel ischemic changes. 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  Prominent diffuse degenerative change throughout the cervical spine. Alignment is unchanged since prior study with mild anterior subluxation of C4 on C5 and C7 on T1, likely degenerative. No acute displaced fractures are identified.   Electronically Signed   By: Burman NievesWilliam  Stevens M.D.   On: 08/10/2014 22:44     EKG Interpretation None      MDM   Final diagnoses:  Minor head injury, initial encounter  Cervical strain, acute, initial encounter    Nursing notes including past medical history and social history reviewed and considered in documentation     Zadie Rhineonald Yaziel Brandon, MD 08/10/14 2309

## 2014-08-10 NOTE — Discharge Instructions (Signed)
You have had a head injury which does not appear to require admission at this time. A concussion is a state of changed mental ability from trauma. ° °SEEK IMMEDIATE MEDICAL ATTENTION IF: °There is confusion or drowsiness (although children frequently become drowsy after injury).  °You cannot awaken the injured person.  °There is nausea (feeling sick to your stomach) or continued, forceful vomiting.  °You notice dizziness or unsteadiness which is getting worse, or inability to walk.  °You have convulsions or unconsciousness.  °You experience severe, persistent headaches not relieved by Tylenol. (Do not take aspirin as this impairs clotting abilities). Take other pain medications only as directed.  °You cannot use arms or legs normally.  °There are changes in pupil sizes. (This is the black center in the colored part of the eye)  °There is clear or bloody discharge from the nose or ears.  °Change in speech, vision, swallowing, or understanding.  °Localized weakness, numbness, tingling, or change in bowel or bladder control. ° °You have neck pain, possibly from a cervical strain and/or pinched nerve.  ° °SEEK IMMEDIATE MEDICAL ATTENTION IF: °You develop difficulties swallowing or breathing.  °You have new or worse numbness, weakness, tingling, or movement problems in your arms or legs.  °You develop increasing pain which is uncontrolled with medications.  °You have change in bowel or bladder function, or other concerns. ° ° °

## 2014-08-10 NOTE — Progress Notes (Signed)
CSW met with pt. However pt appears to be confused. CSW was not able to assess. There was no family present.   Per note, pt presents from facility due to fall.  CSW checked the pt's chart, which states that the pt is from [facility] and does have an altered mental status.  Willette Brace [phone] ED CSW 08/10/2014 10:21 PM    .

## 2014-08-11 NOTE — ED Notes (Signed)
PTAR picked up patient.  

## 2014-10-13 ENCOUNTER — Emergency Department (HOSPITAL_COMMUNITY): Payer: Medicare Other

## 2014-10-13 ENCOUNTER — Emergency Department (HOSPITAL_COMMUNITY)
Admission: EM | Admit: 2014-10-13 | Discharge: 2014-10-13 | Disposition: A | Discharge status: Home / Self Care | Payer: Medicare Other | Attending: Emergency Medicine | Admitting: Emergency Medicine

## 2014-10-13 ENCOUNTER — Encounter (HOSPITAL_COMMUNITY): Payer: Self-pay | Admitting: Emergency Medicine

## 2014-10-13 DIAGNOSIS — S0001XA Abrasion of scalp, initial encounter: Secondary | ICD-10-CM | POA: Diagnosis not present

## 2014-10-13 DIAGNOSIS — R4182 Altered mental status, unspecified: Secondary | ICD-10-CM | POA: Diagnosis present

## 2014-10-13 DIAGNOSIS — Y998 Other external cause status: Secondary | ICD-10-CM | POA: Insufficient documentation

## 2014-10-13 DIAGNOSIS — Z87440 Personal history of urinary (tract) infections: Secondary | ICD-10-CM | POA: Insufficient documentation

## 2014-10-13 DIAGNOSIS — Y92129 Unspecified place in nursing home as the place of occurrence of the external cause: Secondary | ICD-10-CM

## 2014-10-13 DIAGNOSIS — Y92128 Other place in nursing home as the place of occurrence of the external cause: Secondary | ICD-10-CM | POA: Insufficient documentation

## 2014-10-13 DIAGNOSIS — Z792 Long term (current) use of antibiotics: Secondary | ICD-10-CM | POA: Insufficient documentation

## 2014-10-13 DIAGNOSIS — S79919A Unspecified injury of unspecified hip, initial encounter: Secondary | ICD-10-CM | POA: Diagnosis not present

## 2014-10-13 DIAGNOSIS — L219 Seborrheic dermatitis, unspecified: Secondary | ICD-10-CM | POA: Insufficient documentation

## 2014-10-13 DIAGNOSIS — W1839XA Other fall on same level, initial encounter: Secondary | ICD-10-CM | POA: Insufficient documentation

## 2014-10-13 DIAGNOSIS — W19XXXA Unspecified fall, initial encounter: Secondary | ICD-10-CM

## 2014-10-13 DIAGNOSIS — Y9389 Activity, other specified: Secondary | ICD-10-CM | POA: Diagnosis not present

## 2014-10-13 DIAGNOSIS — Z79899 Other long term (current) drug therapy: Secondary | ICD-10-CM | POA: Diagnosis not present

## 2014-10-13 LAB — COMPREHENSIVE METABOLIC PANEL
ALT: 19 U/L (ref 17–63)
ANION GAP: 8 (ref 5–15)
AST: 25 U/L (ref 15–41)
Albumin: 3.5 g/dL (ref 3.5–5.0)
Alkaline Phosphatase: 68 U/L (ref 38–126)
BILIRUBIN TOTAL: 0.7 mg/dL (ref 0.3–1.2)
BUN: 26 mg/dL — AB (ref 6–20)
CALCIUM: 9 mg/dL (ref 8.9–10.3)
CO2: 26 mmol/L (ref 22–32)
CREATININE: 1.1 mg/dL (ref 0.61–1.24)
Chloride: 103 mmol/L (ref 101–111)
GFR calc Af Amer: >60 mL/min (ref >60)
GFR calc non Af Amer: 57 mL/min — ABNORMAL LOW (ref >60)
Glucose, Bld: 97 mg/dL (ref 65–99)
Potassium: 4 mmol/L (ref 3.5–5.1)
SODIUM: 137 mmol/L (ref 135–145)
Total Protein: 6.7 g/dL (ref 6.5–8.1)

## 2014-10-13 LAB — CBC WITH DIFFERENTIAL/PLATELET
Basophils Absolute: 0.1 10*3/uL (ref 0.0–0.1)
Basophils Relative: 1 % (ref 0–1)
EOS PCT: 4 % (ref 0–5)
Eosinophils Absolute: 0.3 10*3/uL (ref 0.0–0.7)
HCT: 35 % — ABNORMAL LOW (ref 39.0–52.0)
HEMOGLOBIN: 11.6 g/dL — AB (ref 13.0–17.0)
LYMPHS ABS: 1.5 10*3/uL (ref 0.7–4.0)
Lymphocytes Relative: 19 % (ref 12–46)
MCH: 30.3 pg (ref 26.0–34.0)
MCHC: 33.1 g/dL (ref 30.0–36.0)
MCV: 91.4 fL (ref 78.0–100.0)
MONO ABS: 1.3 10*3/uL — AB (ref 0.1–1.0)
MONOS PCT: 16 % — AB (ref 3–12)
NEUTROS PCT: 60 % (ref 43–77)
Neutro Abs: 5 10*3/uL (ref 1.7–7.7)
PLATELETS: 212 10*3/uL (ref 150–400)
RBC: 3.83 MIL/uL — ABNORMAL LOW (ref 4.22–5.81)
RDW: 13.6 % (ref 11.5–15.5)
WBC: 8.1 10*3/uL (ref 4.0–10.5)

## 2014-10-13 LAB — URINALYSIS, ROUTINE W REFLEX MICROSCOPIC
BILIRUBIN URINE: NEGATIVE
GLUCOSE, UA: NEGATIVE mg/dL
HGB URINE DIPSTICK: NEGATIVE
Ketones, ur: NEGATIVE mg/dL
Leukocytes, UA: NEGATIVE
Nitrite: NEGATIVE
PH: 6.5 (ref 5.0–8.0)
Protein, ur: NEGATIVE mg/dL
SPECIFIC GRAVITY, URINE: 1.008 (ref 1.005–1.030)
UROBILINOGEN UA: 0.2 mg/dL (ref 0.0–1.0)

## 2014-10-13 LAB — TROPONIN I: Troponin I: <0.03 ng/mL (ref <0.031)

## 2014-10-13 NOTE — ED Provider Notes (Signed)
CSN: [id number]     Arrival date & time 10/13/14  0003 History  This chart was scribed for Devoria AlbeIva Haadiya Frogge, MD by Evon Slackerrance Branch, ED Scribe. This patient was seen in room A01C/A01C and the patient's care was started at 12:52 AM.    Chief Complaint  Patient presents with  . Altered Mental Status    [facility] and staff says he has been altered for several months.  According to EMS, staff says his AMS has been increasingly worse over a month.   The history is provided by medical records and the EMS personnel.   HPI Comments: Level 5 Caveat: Age   Terrance Adams is a 81 y.o. male with PMHx of UTI brought in by ambulance, who presents to the Emergency Department complaining of worsening AMS onset 1 month.   Per nursing note pt had an unwitnessed fall tonight. Pt presents with bruising to the right side of his head with associated bruising and abrasions. Per nursing note pt was complaining of hip pain with palpation.   Pt is complaining of pain due to something hard around his neck which is from being placed in the cervical collar by EMS. He is able to answer simple questions. All his commands.  PCP [facility] [place]   Past Medical History  Diagnosis Date  . UTI (urinary tract infection)     HX OF UTI   Past Surgical History  Procedure Laterality Date  . Hernia repair     History reviewed. No pertinent family history. History  Substance Use Topics  . Smoking status: Never Smoker   . Smokeless tobacco: Never Used  . Alcohol Use: No  lives in NH  Review of Systems  Unable to perform ROS: Age  Skin: Positive for wound.     Allergies  Review of patient's allergies indicates no known allergies.  Home Medications   Prior to Admission medications   Medication Sig Start Date End Date Taking? Authorizing Provider  acetaminophen (TYLENOL) 325 MG tablet Take 2 tablets (650 mg total) by mouth every 4 (four) hours as needed for mild pain, fever or headache. 12/21/13  Yes  Osvaldo ShipperGokul Krishnan, MD  cyanocobalamin 500 MCG tablet Take 1 tablet (500 mcg total) by mouth daily. 12/21/13  Yes Osvaldo ShipperGokul Krishnan, MD  ferrous sulfate 325 (65 FE) MG tablet Take 325 mg by mouth at bedtime.   Yes Historical Provider, MD  Multiple Vitamins-Minerals (DECUBI-VITE PO) Take 1 tablet by mouth daily.   Yes Historical Provider, MD  nitrofurantoin (MACRODANTIN) 50 MG capsule Take 50 mg by mouth at bedtime.   Yes Historical Provider, MD  senna-docusate (SENOKOT-S) 8.6-50 MG per tablet Take 1 tablet by mouth at bedtime. Patient taking differently: Take 1 tablet by mouth at bedtime as needed for mild constipation.  12/21/13  Yes Osvaldo ShipperGokul Krishnan, MD  traZODone (DESYREL) 50 MG tablet Take 0.5 tablets (25 mg total) by mouth at bedtime as needed for sleep. 12/21/13  Yes Osvaldo ShipperGokul Krishnan, MD  UNABLE TO FIND Take 120 mLs by mouth 3 (three) times daily. Med Name: MedPass   Yes Historical Provider, MD  cefdinir (OMNICEF) 300 MG capsule Take 1 capsule by mouth twice daily w/ last dose on evening of 2/1 Patient not taking: Reported on 05/30/2014 04/28/14   Alysia PennaScott Holwerda, MD  feeding supplement, ENSURE COMPLETE, (ENSURE COMPLETE) LIQD Take 237 mLs by mouth 3 (three) times daily between meals. Patient not taking: Reported on 05/30/2014 04/28/14   Alysia PennaScott Holwerda, MD  silver  sulfADIAZINE (SILVADENE) 1 % cream Apply 1 application topically daily. Patient not taking: Reported on 04/23/2014 11/30/13   Vivi Barrack, DPM  trimethoprim-polymyxin b (POLYTRIM) ophthalmic solution Place 1 drop into both eyes 4 (four) times daily. Last dose is evening of 2/2 Patient not taking: Reported on 05/30/2014 04/28/14   Alysia Penna, MD   BP 111/83 mmHg  Pulse 70  Resp 16  SpO2 100%  Vital signs normal     Physical Exam  Constitutional: He is oriented to person, place, and time.  Non-toxic appearance. He does not appear ill. No distress.  Frail elderly male.   HENT:  Head: Normocephalic.  Right Ear: External ear normal.  Left  Ear: External ear normal.  Nose: Nose normal. No mucosal edema or rhinorrhea.  Mouth/Throat: Oropharynx is clear and moist and mucous membranes are normal. No dental abscesses or uvula swelling.  Patient is noted to have 2 abrasions on his scalp, please see photo.  Eyes: Conjunctivae and EOM are normal. Pupils are equal, round, and reactive to light.  Neck: Normal range of motion and full passive range of motion without pain. Neck supple.  Cardiovascular: Normal rate, regular rhythm and normal heart sounds.  Exam reveals no gallop and no friction rub.   No murmur heard. Pulmonary/Chest: Effort normal and breath sounds normal. No respiratory distress. He has no wheezes. He has no rhonchi. He has no rales. He exhibits no tenderness and no crepitus.  Abdominal: Soft. Normal appearance and bowel sounds are normal. He exhibits no distension. There is no tenderness. There is no rebound and no guarding.  Musculoskeletal: Normal range of motion. He exhibits no edema or tenderness.  Good ROM of lower extremities without pain.   Neurological: He is alert and oriented to person, place, and time. He has normal strength. No cranial nerve deficit.  Skin: Skin is warm, dry and intact. No rash noted. No erythema. No pallor.  Patient is noted to have seborrheic changes on his scalp.  Psychiatric: He has a normal mood and affect. His speech is normal and behavior is normal. His mood appears not anxious.  Nursing note and vitals reviewed.      ED Course  Procedures (including critical care time) DIAGNOSTIC STUDIES: Oxygen Saturation is 96% on RA, adequate by my interpretation.    COORDINATION OF CARE:  Patient remained stable during his ED visit. His c-collar was removed by nursing staff after his CT of his cervical spine had been resulted. He is incontinent of urine and in and out cath was done to obtain urine sample which was also normal. Patient was discharged back to his facility.   Labs  Review Comprehensive metabolic panel all normal except for you and slightly elevated at 26, and GFR slightly low at 57  Troponin is less than 0.03 which is normal  CBC shows hemoglobin mildly low at 11.6 otherwise normal  UA normal  Imaging Review Ct Head Wo Contrast   Ct Cervical Spine Wo Contrast  10/13/2014   CLINICAL DATA:  81 year old male with fall  EXAM: CT HEAD WITHOUT CONTRAST  CT CERVICAL SPINE WITHOUT CONTRAST  TECHNIQUE: Multidetector CT imaging of the head and cervical spine was performed following the standard protocol without intravenous contrast. Multiplanar CT image reconstructions of the cervical spine were also generated.  COMPARISON:  Head CT dated 08/10/2014  FINDINGS: CT HEAD FINDINGS  The ventricles are dilated and the sulci are prominent compatible with age-related atrophy. Periventricular and deep white matter hypodensities represent chronic  microvascular ischemic changes. There is no intracranial hemorrhage. No mass effect or midline shift identified.  Diffuse mucoperiosteal thickening of paranasal sinuses. No air-fluid level. The calvarium is intact.  CT CERVICAL SPINE FINDINGS  There is no acute fracture or subluxation of the cervical spine.There is extensive multilevel degenerative changes with osteophyte formation and facet hypertrophy. grade 1 C7-T1 anterolisthesis.The odontoid and spinous processes are intact.There is normal anatomic alignment of the C1-C2 lateral masses. The visualized soft tissues appear unremarkable.  Biapical scarring.  IMPRESSION: No acute intracranial pathology.  Age-related atrophy and chronic microvascular ischemic disease.  No acute cervical spine fracture.   Electronically Signed   By: Elgie Collard M.D.   On: 10/13/2014 02:35     EKG Interpretation   Date/Time:  Friday October 13 2014 00:22:18 EDT Ventricular Rate:  63 PR Interval:  169 QRS Duration: 96 QT Interval:  405 QTC Calculation: 415 R Axis:   57 Text Interpretation:  Sinus  rhythm Normal ECG No significant change since  last tracing 30 May 2014 Confirmed by Lorine Iannaccone  MD-I, Telissa Palmisano ([id number]) on  10/13/2014 1:41:13 AM      MDM   Final diagnoses:  Fall at nursing home, initial encounter  Abrasion of scalp, initial encounter   Plan discharge  Devoria Albe, MD, FACEP    I personally performed the services described in this documentation, which was scribed in my presence. The recorded information has been reviewed and considered.  Devoria Albe, MD, Concha Pyo, MD 10/13/14 [phone]

## 2014-10-13 NOTE — ED Notes (Signed)
Patient transported to CT 

## 2014-10-13 NOTE — ED Notes (Signed)
[facility] and staff says he has been altered for several months.  According to EMS, staff says his AMS has been increasingly worse over a month.  Staff says he had an unwitnessed fall.  The patient has bruising to the right side of his head with two abrasions and bruising.  EMS advised the skull is firm not mushy.  He did complain of pain to the right hip upon palpation.  No bruising, no shortening of the right hip.

## 2014-10-13 NOTE — Discharge Instructions (Signed)
He had a CT scan of his head and neck which did not show any acute changes. His basic labs were normal including a normal urinalysis. Keep the abrasion clean and dry. You can use anabolic ointment on the wound if you wish. Have him rechecked for any problems listed on the head injury sheet.   Abrasion An abrasion is a cut or scrape of the skin. Abrasions do not extend through all layers of the skin and most heal within 10 days. It is important to care for your abrasion properly to prevent infection. CAUSES  Most abrasions are caused by falling on, or gliding across, the ground or other surface. When your skin rubs on something, the outer and inner layer of skin rubs off, causing an abrasion. DIAGNOSIS  Your caregiver will be able to diagnose an abrasion during a physical exam.  TREATMENT  Your treatment depends on how large and deep the abrasion is. Generally, your abrasion will be cleaned with water and a mild soap to remove any dirt or debris. An antibiotic ointment may be put over the abrasion to prevent an infection. A bandage (dressing) may be wrapped around the abrasion to keep it from getting dirty.  You may need a tetanus shot if:  You cannot remember when you had your last tetanus shot.  You have never had a tetanus shot.  The injury broke your skin. If you get a tetanus shot, your arm may swell, get red, and feel warm to the touch. This is common and not a problem. If you need a tetanus shot and you choose not to have one, there is a rare chance of getting tetanus. Sickness from tetanus can be serious.  HOME CARE INSTRUCTIONS   If a dressing was applied, change it at least once a day or as directed by your caregiver. If the bandage sticks, soak it off with warm water.   Wash the area with water and a mild soap to remove all the ointment 2 times a day. Rinse off the soap and pat the area dry with a clean towel.   Reapply any ointment as directed by your caregiver. This will help  prevent infection and keep the bandage from sticking. Use gauze over the wound and under the dressing to help keep the bandage from sticking.   Change your dressing right away if it becomes wet or dirty.   Only take over-the-counter or prescription medicines for pain, discomfort, or fever as directed by your caregiver.   Follow up with your caregiver within 24-48 hours for a wound check, or as directed. If you were not given a wound-check appointment, look closely at your abrasion for redness, swelling, or pus. These are signs of infection. SEEK IMMEDIATE MEDICAL CARE IF:   You have increasing pain in the wound.   You have redness, swelling, or tenderness around the wound.   You have pus coming from the wound.   You have a fever or persistent symptoms for more than 2-3 days.  You have a fever and your symptoms suddenly get worse.  You have a bad smell coming from the wound or dressing.  MAKE SURE YOU:   Understand these instructions.  Will watch your condition.  Will get help right away if you are not doing well or get worse. Document Released: 12/25/2004 Document Revised: 03/03/2012 Document Reviewed: 02/18/2011 [facility] Patient Information 2015 [place], [place]. This information is not intended to replace advice given to you by your health care provider. Make sure  you discuss any questions you have with your health care provider.  Head Injury You have a head injury. Headaches and throwing up (vomiting) are common after a head injury. It should be easy to wake up from sleeping. Sometimes you must stay in the hospital. Most problems happen within the first 24 hours. Side effects may occur up to 7-10 days after the injury.  WHAT ARE THE TYPES OF HEAD INJURIES? Head injuries can be as minor as a bump. Some head injuries can be more severe. More severe head injuries include:  A jarring injury to the brain (concussion).  A bruise of the brain (contusion). This mean there is  bleeding in the brain that can cause swelling.  A cracked skull (skull fracture).  Bleeding in the brain that collects, clots, and forms a bump (hematoma). WHEN SHOULD I GET HELP RIGHT AWAY?   You are confused or sleepy.  You cannot be woken up.  You feel sick to your stomach (nauseous) or keep throwing up (vomiting).  Your dizziness or unsteadiness is getting worse.  You have very bad, lasting headaches that are not helped by medicine. Take medicines only as told by your doctor.  You cannot use your arms or legs like normal.  You cannot walk.  You notice changes in the black spots in the center of the colored part of your eye (pupil).  You have clear or bloody fluid coming from your nose or ears.  You have trouble seeing. During the next 24 hours after the injury, you must stay with someone who can watch you. This person should get help right away (call 911 in the [place] if you start to shake and are not able to control it (have seizures), you pass out, or you are unable to wake up. HOW CAN I PREVENT A HEAD INJURY IN THE FUTURE?  Wear seat belts.  Wear a helmet while bike riding and playing sports like football.  Stay away from dangerous activities around the house. WHEN CAN I RETURN TO NORMAL ACTIVITIES AND ATHLETICS? See your doctor before doing these activities. You should not do normal activities or play contact sports until 1 week after the following symptoms have stopped:  Headache that does not go away.  Dizziness.  Poor attention.  Confusion.  Memory problems.  Sickness to your stomach or throwing up.  Tiredness.  Fussiness.  Bothered by bright lights or loud noises.  Anxiousness or depression.  Restless sleep. MAKE SURE YOU:   Understand these instructions.  Will watch your condition.  Will get help right away if you are not doing well or get worse. Document Released: 02/28/2008 Document Revised: 08/01/2013 Document Reviewed:  11/22/2012 [facility] Patient Information 2015 [place]. This information is not intended to replace advice given to you by your health care provider. Make sure you discuss any questions you have with your health care provider.

## 2014-11-05 ENCOUNTER — Emergency Department (HOSPITAL_COMMUNITY): Payer: Medicare Other

## 2014-11-05 ENCOUNTER — Encounter (HOSPITAL_COMMUNITY): Payer: Self-pay | Admitting: Emergency Medicine

## 2014-11-05 ENCOUNTER — Emergency Department (HOSPITAL_COMMUNITY)
Admission: EM | Admit: 2014-11-05 | Discharge: 2014-11-05 | Disposition: A | Discharge status: Skilled Nursing Facility | Payer: Medicare Other | Attending: Emergency Medicine | Admitting: Emergency Medicine

## 2014-11-05 ENCOUNTER — Emergency Department (HOSPITAL_COMMUNITY)
Admission: EM | Admit: 2014-11-05 | Discharge: 2014-11-06 | Disposition: A | Discharge status: Home / Self Care | Payer: Medicare Other | Attending: Emergency Medicine | Admitting: Emergency Medicine

## 2014-11-05 DIAGNOSIS — W19XXXA Unspecified fall, initial encounter: Secondary | ICD-10-CM | POA: Insufficient documentation

## 2014-11-05 DIAGNOSIS — Z79899 Other long term (current) drug therapy: Secondary | ICD-10-CM | POA: Diagnosis not present

## 2014-11-05 DIAGNOSIS — I6782 Cerebral ischemia: Secondary | ICD-10-CM | POA: Insufficient documentation

## 2014-11-05 DIAGNOSIS — Y92128 Other place in nursing home as the place of occurrence of the external cause: Secondary | ICD-10-CM | POA: Insufficient documentation

## 2014-11-05 DIAGNOSIS — Z87440 Personal history of urinary (tract) infections: Secondary | ICD-10-CM | POA: Insufficient documentation

## 2014-11-05 DIAGNOSIS — Z043 Encounter for examination and observation following other accident: Secondary | ICD-10-CM | POA: Insufficient documentation

## 2014-11-05 DIAGNOSIS — Y998 Other external cause status: Secondary | ICD-10-CM | POA: Diagnosis not present

## 2014-11-05 DIAGNOSIS — R109 Unspecified abdominal pain: Secondary | ICD-10-CM | POA: Insufficient documentation

## 2014-11-05 DIAGNOSIS — W1809XA Striking against other object with subsequent fall, initial encounter: Secondary | ICD-10-CM

## 2014-11-05 DIAGNOSIS — H9190 Unspecified hearing loss, unspecified ear: Secondary | ICD-10-CM | POA: Diagnosis not present

## 2014-11-05 DIAGNOSIS — Y9389 Activity, other specified: Secondary | ICD-10-CM | POA: Diagnosis not present

## 2014-11-05 DIAGNOSIS — F0390 Unspecified dementia without behavioral disturbance: Secondary | ICD-10-CM | POA: Insufficient documentation

## 2014-11-05 LAB — BASIC METABOLIC PANEL
Anion gap: 11 (ref 5–15)
BUN: 31 mg/dL — ABNORMAL HIGH (ref 6–20)
CO2: 21 mmol/L — ABNORMAL LOW (ref 22–32)
CREATININE: 1.05 mg/dL (ref 0.61–1.24)
Calcium: 9.4 mg/dL (ref 8.9–10.3)
Chloride: 108 mmol/L (ref 101–111)
GFR calc Af Amer: >60 mL/min (ref >60)
GFR, EST NON AFRICAN AMERICAN: 60 mL/min — AB (ref >60)
GLUCOSE: 205 mg/dL — AB (ref 65–99)
POTASSIUM: 5.6 mmol/L — AB (ref 3.5–5.1)
Sodium: 140 mmol/L (ref 135–145)

## 2014-11-05 NOTE — ED Notes (Signed)
Awake. Verbally responsive. A/O. Resp even and unlabored. No audible adventitious breath sounds noted. ABC's intact.  

## 2014-11-05 NOTE — ED Notes (Addendum)
C collar in place by EMS, non tender to palpation, skin tears noted to left and right elbow, clean dressings applied, no rotation or deformities noted.

## 2014-11-05 NOTE — Discharge Instructions (Signed)

## 2014-11-05 NOTE — ED Provider Notes (Signed)
CSN: [id number]     Arrival date & time 11/05/14  2154 History   First MD Initiated Contact with Patient 11/05/14 2202     Chief Complaint  Patient presents with  . Fall  . Flank Pain     (Consider location/radiation/quality/duration/timing/severity/associated sxs/prior Treatment) HPI 81 year old male who presents after unwitnessed fall. [facility]. He was seen earlier this morning after unwitnessed fall and had an unremarkable CT head and cervical spine. This evening staff members found him lying on the ground again and had a presumed unwitnessed fall. EMS was called and cervical collar was placed he was complaining of severe left flank pain. According to staff members he was behaving like his normal self, and has a history of dementia. No history of anticoagulation usage. Patient is unable to provide any history, but is alert and oriented 0.    Past Medical History  Diagnosis Date  . UTI (urinary tract infection)     HX OF UTI   Past Surgical History  Procedure Laterality Date  . Hernia repair     History reviewed. No pertinent family history. History  Substance Use Topics  . Smoking status: Never Smoker   . Smokeless tobacco: Never Used  . Alcohol Use: No    Review of Systems  Unable to perform ROS: Dementia      Allergies  Review of patient's allergies indicates no known allergies.  Home Medications   Prior to Admission medications   Medication Sig Start Date End Date Taking? Authorizing Provider  acetaminophen (TYLENOL) 325 MG tablet Take 2 tablets (650 mg total) by mouth every 4 (four) hours as needed for mild pain, fever or headache. 12/21/13   Osvaldo Shipper, MD  cefdinir (OMNICEF) 300 MG capsule Take 1 capsule by mouth twice daily w/ last dose on evening of 2/1 Patient not taking: Reported on 05/30/2014 04/28/14   Alysia Penna, MD  cyanocobalamin 500 MCG tablet Take 1 tablet (500 mcg total) by mouth daily.  12/21/13   Osvaldo Shipper, MD  feeding supplement, ENSURE COMPLETE, (ENSURE COMPLETE) LIQD Take 237 mLs by mouth 3 (three) times daily between meals. Patient not taking: Reported on 05/30/2014 04/28/14   Alysia Penna, MD  ferrous sulfate 325 (65 FE) MG tablet Take 325 mg by mouth at bedtime.    Historical Provider, MD  Multiple Vitamins-Minerals (DECUBI-VITE PO) Take 1 tablet by mouth daily.    Historical Provider, MD  nitrofurantoin (MACRODANTIN) 50 MG capsule Take 50 mg by mouth at bedtime.    Historical Provider, MD  senna-docusate (SENOKOT-S) 8.6-50 MG per tablet Take 1 tablet by mouth at bedtime. Patient taking differently: Take 1 tablet by mouth at bedtime as needed for mild constipation.  12/21/13   Osvaldo Shipper, MD  silver sulfADIAZINE (SILVADENE) 1 % cream Apply 1 application topically daily. Patient not taking: Reported on 04/23/2014 11/30/13   Vivi Barrack, DPM  traZODone (DESYREL) 50 MG tablet Take 0.5 tablets (25 mg total) by mouth at bedtime as needed for sleep. 12/21/13   Osvaldo Shipper, MD  trimethoprim-polymyxin b (POLYTRIM) ophthalmic solution Place 1 drop into both eyes 4 (four) times daily. Last dose is evening of 2/2 Patient not taking: Reported on 05/30/2014 04/28/14   Alysia Penna, MD  UNABLE TO FIND Take 120 mLs by mouth 3 (three) times daily. Med Name: MedPass    Historical Provider, MD   BP 142/66 mmHg  Pulse 83  Temp(Src) 99.8 F (37.7 C) (Oral)  Resp 20  SpO2 92% Physical Exam  Nursing note and vitals reviewed. Physical Exam  Nursing note and vitals reviewed. Constitutional: Elderly man, awake, and cervical collar, in no acute distress and is nontoxic in appearance Head: Normocephalic and atraumatic.  Mouth/Throat: Oropharynx is clear and moist.  Neck: Normal range of motion. Neck supple.  no cervical spine tenderness to palpation, step-offs, or malalignment. Cardiovascular: Normal rate and regular rhythm.   no edema. Pulmonary/Chest: Effort normal and breath  sounds normal.  Abdominal: Soft. Nondistended. Unable to localize pain, but grunts on palpation of abdomen. There is no rebound and no guarding.  Musculoskeletal: Normal range of motion of all 4 extremities.  no deformities.  Neurological: Alert, oriented 0, no facial droop, withdraws to pain symmetrically in all 4 extremities Skin: Skin is warm and dry.  Psychiatric: Cooperative   ED Course  Procedures (including critical care time) Labs Review Labs Reviewed  BASIC METABOLIC PANEL    Imaging Review Ct Head Wo Contrast  11/05/2014   CLINICAL DATA:  Unwitnessed fall, posterior neck contusion. No loss of consciousness.  EXAM: CT HEAD WITHOUT CONTRAST  CT CERVICAL SPINE WITHOUT CONTRAST  TECHNIQUE: Multidetector CT imaging of the head and cervical spine was performed following the standard protocol without intravenous contrast. Multiplanar CT image reconstructions of the cervical spine were also generated.  COMPARISON:  CT head and cervical spine October 13, 2014  FINDINGS: CT HEAD FINDINGS  The ventricles and sulci are normal for age. No intraparenchymal hemorrhage, mass effect nor midline shift. Patchy supratentorial white matter hypodensities are within normal range for patient's age and though non-specific suggest sequelae of chronic small vessel ischemic disease. No acute large vascular territory infarcts.  No abnormal extra-axial fluid collections. Basal cisterns are patent. Mild to moderate calcific atherosclerosis of the carotid siphons and included vertebral arteries.  No skull fracture. The included ocular globes and orbital contents are non-suspicious. The mastoid aircells and included paranasal sinuses are well-aerated. RIGHT maxillary antral wall thickening consistent with chronic sinusitis, mild paranasal sinus mucosal thickening. The mastoid air cells are well aerated. Hearing aid LEFT external auditory canal.  CT CERVICAL SPINE FINDINGS  Cervical vertebral bodies and posterior elements  intact. Straightened cervical lordosis. Grade 1 C4-5 anterolisthesis, grade 1 C7-T1 anterolisthesis without spondylolysis. Mild broad levoscoliosis. Severe RIGHT cervical facet arthropathy with C2-3 and C5-6 facet fusion on degenerative basis. Severe C5-6 and C6-7 disc height loss, endplate sclerosis and marginal spurring consistent with degenerative disc, moderate to severe at C3-4. Severe RIGHT C3-4, RIGHT C4-5 and RIGHT C6-7 neural foraminal narrowing. C1-2 articulation maintained with severe arthropathy, subchondral cysts consistent with degenerative change.  Prevertebral and paraspinal soft tissues are nonacute. Moderate calcific atherosclerosis the carotid bulbs. Mild fibronodular scarring in the lung apices.  IMPRESSION: CT HEAD: No acute intracranial process.  Stable appearance of the head from October 13, 2014: Involutional changes. Moderate chronic small vessel ischemic disease.  CT CERVICAL SPINE: No acute fracture. Stable grade 1 C3-4 and C7-T1 anterolisthesis on degenerative basis.   Electronically Signed   By: Awilda Metro M.D.   On: 11/05/2014 05:35   Ct Cervical Spine Wo Contrast  11/05/2014   CLINICAL DATA:  Unwitnessed fall, posterior neck contusion. No loss of consciousness.  EXAM: CT HEAD WITHOUT CONTRAST  CT CERVICAL SPINE WITHOUT CONTRAST  TECHNIQUE: Multidetector CT imaging of the head and cervical spine was performed following the standard protocol without intravenous contrast. Multiplanar CT image reconstructions of the cervical spine were also generated.  COMPARISON:  CT head and cervical spine October 13, 2014  FINDINGS: CT HEAD FINDINGS  The ventricles and sulci are normal for age. No intraparenchymal hemorrhage, mass effect nor midline shift. Patchy supratentorial white matter hypodensities are within normal range for patient's age and though non-specific suggest sequelae of chronic small vessel ischemic disease. No acute large vascular territory infarcts.  No abnormal extra-axial fluid  collections. Basal cisterns are patent. Mild to moderate calcific atherosclerosis of the carotid siphons and included vertebral arteries.  No skull fracture. The included ocular globes and orbital contents are non-suspicious. The mastoid aircells and included paranasal sinuses are well-aerated. RIGHT maxillary antral wall thickening consistent with chronic sinusitis, mild paranasal sinus mucosal thickening. The mastoid air cells are well aerated. Hearing aid LEFT external auditory canal.  CT CERVICAL SPINE FINDINGS  Cervical vertebral bodies and posterior elements intact. Straightened cervical lordosis. Grade 1 C4-5 anterolisthesis, grade 1 C7-T1 anterolisthesis without spondylolysis. Mild broad levoscoliosis. Severe RIGHT cervical facet arthropathy with C2-3 and C5-6 facet fusion on degenerative basis. Severe C5-6 and C6-7 disc height loss, endplate sclerosis and marginal spurring consistent with degenerative disc, moderate to severe at C3-4. Severe RIGHT C3-4, RIGHT C4-5 and RIGHT C6-7 neural foraminal narrowing. C1-2 articulation maintained with severe arthropathy, subchondral cysts consistent with degenerative change.  Prevertebral and paraspinal soft tissues are nonacute. Moderate calcific atherosclerosis the carotid bulbs. Mild fibronodular scarring in the lung apices.  IMPRESSION: CT HEAD: No acute intracranial process.  Stable appearance of the head from October 13, 2014: Involutional changes. Moderate chronic small vessel ischemic disease.  CT CERVICAL SPINE: No acute fracture. Stable grade 1 C3-4 and C7-T1 anterolisthesis on degenerative basis.   Electronically Signed   By: Awilda Metro M.D.   On: 11/05/2014 05:35     EKG Interpretation None      MDM   Final diagnoses:  None    In short, this is a 81 year old male with history of dementia who presents from nursing facility with unwitnessed fall. He arrives with a cervical collar, but is awake and at his baseline mental status per nursing  home. He is nontoxic and in no acute distress. Vital signs are non-concerning. He has no evidence of trauma on exam, but does appear to have some pain with palpation of his abdomen. Cervical collar is in place on arrival. Given his age and fall, CT head, cervical neck, and abdomen pelvis are performed. This shows no acute traumatic injuries. He is felt stable for discharge back to nursing home. On repeat exam he has a benign abdomen.    Lavera Guise, MD 11/06/14 [phone]

## 2014-11-05 NOTE — ED Notes (Signed)
[facility] contacted for transport 

## 2014-11-05 NOTE — ED Notes (Signed)
Bed: WA03 Expected date:  Expected time:  Means of arrival:  Comments: EMS 59 M Fall

## 2014-11-05 NOTE — ED Notes (Signed)
Pt presents from [facility] at [facility]) via EMS for unwitnessed fall. Skin tear left elbow, contusion to posterior neck. C-collar in place. Denies LOC, back or neck pain. No anticoagulants.   Last VS: 130/84, 64hr, 16resp.

## 2014-11-05 NOTE — ED Notes (Signed)
Pt's family was contacted and information received that pt lives at [facility], not [facility] per EMS' report.  Staff at [facility] was called and information on pt received: pt has "mild dementia" but still lives at the assisted living , is [facility], speaks and responds slowly, ambulates slowly with a walker; pt has no complaints of pain to the staff after his fall tonight; pt was sent to ED for evaluation and assessment of injuries, if there are any.

## 2014-11-05 NOTE — ED Notes (Signed)
Brought in by EMS from [facility] with c/o left flank pain after his unwitnessed fall tonight.  Staff at the facility reported that pt was found lying in his bathroom floor.  Pt c/o left flank pain after his fall, no other complaints.  Pt exhibits no s/s deformities or obvious deformities by EMS.  Pt arrived to ED on c-collar.

## 2014-11-05 NOTE — ED Provider Notes (Signed)
CSN: [id number]     Arrival date & time 11/05/14  0406 History   First MD Initiated Contact with Patient 11/05/14 [phone]     Chief Complaint  Patient presents with  . Fall     (Consider location/radiation/quality/duration/timing/severity/associated sxs/prior Treatment) Patient is a 81 y.o. male presenting with fall. The history is provided by the EMS personnel. History limited by: extreme hard of hearing.  Fall This is a recurrent problem. The current episode started less than 1 hour ago. The problem occurs constantly. The problem has not changed since onset.Pertinent negatives include no chest pain, no abdominal pain, no headaches and no shortness of breath. Nothing aggravates the symptoms. Nothing relieves the symptoms. He has tried nothing for the symptoms. The treatment provided no relief.  Struck head and neck reportedly  Past Medical History  Diagnosis Date  . UTI (urinary tract infection)     HX OF UTI   Past Surgical History  Procedure Laterality Date  . Hernia repair     No family history on file. History  Substance Use Topics  . Smoking status: Never Smoker   . Smokeless tobacco: Never Used  . Alcohol Use: No    Review of Systems  Unable to perform ROS Respiratory: Negative for shortness of breath.   Cardiovascular: Negative for chest pain.  Gastrointestinal: Negative for abdominal pain.  Neurological: Negative for headaches.      Allergies  Review of patient's allergies indicates no known allergies.  Home Medications   Prior to Admission medications   Medication Sig Start Date End Date Taking? Authorizing Provider  acetaminophen (TYLENOL) 325 MG tablet Take 2 tablets (650 mg total) by mouth every 4 (four) hours as needed for mild pain, fever or headache. 12/21/13   Osvaldo Shipper, MD  cefdinir (OMNICEF) 300 MG capsule Take 1 capsule by mouth twice daily w/ last dose on evening of 2/1 Patient not taking: Reported on 05/30/2014 04/28/14   Alysia Penna, MD   cyanocobalamin 500 MCG tablet Take 1 tablet (500 mcg total) by mouth daily. 12/21/13   Osvaldo Shipper, MD  feeding supplement, ENSURE COMPLETE, (ENSURE COMPLETE) LIQD Take 237 mLs by mouth 3 (three) times daily between meals. Patient not taking: Reported on 05/30/2014 04/28/14   Alysia Penna, MD  ferrous sulfate 325 (65 FE) MG tablet Take 325 mg by mouth at bedtime.    Historical Provider, MD  Multiple Vitamins-Minerals (DECUBI-VITE PO) Take 1 tablet by mouth daily.    Historical Provider, MD  nitrofurantoin (MACRODANTIN) 50 MG capsule Take 50 mg by mouth at bedtime.    Historical Provider, MD  senna-docusate (SENOKOT-S) 8.6-50 MG per tablet Take 1 tablet by mouth at bedtime. Patient taking differently: Take 1 tablet by mouth at bedtime as needed for mild constipation.  12/21/13   Osvaldo Shipper, MD  silver sulfADIAZINE (SILVADENE) 1 % cream Apply 1 application topically daily. Patient not taking: Reported on 04/23/2014 11/30/13   Vivi Barrack, DPM  traZODone (DESYREL) 50 MG tablet Take 0.5 tablets (25 mg total) by mouth at bedtime as needed for sleep. 12/21/13   Osvaldo Shipper, MD  trimethoprim-polymyxin b (POLYTRIM) ophthalmic solution Place 1 drop into both eyes 4 (four) times daily. Last dose is evening of 2/2 Patient not taking: Reported on 05/30/2014 04/28/14   Alysia Penna, MD  UNABLE TO FIND Take 120 mLs by mouth 3 (three) times daily. Med Name: MedPass    Historical Provider, MD   BP 150/78 mmHg  Pulse 75  Temp(Src) 97.7 F (  36.5 C) (Oral)  Resp 16  SpO2 96% Physical Exam  Constitutional: He appears well-developed and well-nourished. No distress.  HENT:  Head: Normocephalic and atraumatic. Head is without raccoon's eyes and without Battle's sign.  Right Ear: No mastoid tenderness. No hemotympanum.  Left Ear: No mastoid tenderness. No hemotympanum.  Eyes: Conjunctivae and EOM are normal.  Neck: No tracheal deviation present.  c collar in place  Cardiovascular: Normal rate,  regular rhythm and intact distal pulses.   Pulmonary/Chest: Effort normal and breath sounds normal. No respiratory distress. He has no wheezes. He has no rales.  Abdominal: Soft. Bowel sounds are normal. There is no tenderness. There is no rebound and no guarding.  Pelvis stable  Musculoskeletal: Normal range of motion.  FROM of B hips without pain.  No foreshortening or rotation no snuff box tenderness of either wrist  Neurological: He is alert. He has normal reflexes.  Skin: Skin is warm and dry.    ED Course  Procedures (including critical care time) Labs Review Labs Reviewed - No data to display  Imaging Review No results found.   EKG Interpretation None      MDM   Final diagnoses:  None    Safe for discharge back to nursing home    Deannie Resetar, MD 11/05/14 [phone]

## 2014-11-05 NOTE — ED Notes (Signed)
RN will get blood.

## 2014-11-05 NOTE — ED Notes (Signed)
Bed: WA09 Expected date:  Expected time:  Means of arrival:  Comments: EMS 81yo M Fall

## 2014-11-06 ENCOUNTER — Encounter (HOSPITAL_COMMUNITY): Payer: Self-pay

## 2014-11-06 MED ORDER — IOHEXOL 300 MG/ML  SOLN
100.0000 mL | Freq: Once | INTRAMUSCULAR | Status: AC | PRN
  Administered 2014-11-06: 100 mL via INTRAVENOUS

## 2014-11-06 NOTE — ED Notes (Signed)
[facility] here to transport pt back to [facility]. 

## 2014-11-06 NOTE — ED Notes (Signed)
[facility] was called for pt's transportation back to [facility] at [place]. 

## 2014-11-06 NOTE — Discharge Instructions (Signed)
Return without fail for worsening symptoms, including new altered mental status, fevers, vomiting and unable to keep down food/fluids, or any other symptoms concerning to you.   Fall Prevention and Home Safety Falls cause injuries and can affect all age groups. It is possible to use preventive measures to significantly decrease the likelihood of falls. There are many simple measures which can make your home safer and prevent falls. OUTDOORS  Repair cracks and edges of walkways and driveways.  Remove high doorway thresholds.  Trim shrubbery on the main path into your home.  Have good outside lighting.  Clear walkways of tools, rocks, debris, and clutter.  Check that handrails are not broken and are securely fastened. Both sides of steps should have handrails.  Have leaves, snow, and ice cleared regularly.  Use sand or salt on walkways during winter months.  In the garage, clean up grease or oil spills. BATHROOM  Install night lights.  Install grab bars by the toilet and in the tub and shower.  Use non-skid mats or decals in the tub or shower.  Place a plastic non-slip stool in the shower to sit on, if needed.  Keep floors dry and clean up all water on the floor immediately.  Remove soap buildup in the tub or shower on a regular basis.  Secure bath mats with non-slip, double-sided rug tape.  Remove throw rugs and tripping hazards from the floors. BEDROOMS  Install night lights.  Make sure a bedside light is easy to reach.  Do not use oversized bedding.  Keep a telephone by your bedside.  Have a firm chair with side arms to use for getting dressed.  Remove throw rugs and tripping hazards from the floor. KITCHEN  Keep handles on pots and pans turned toward the center of the stove. Use back burners when possible.  Clean up spills quickly and allow time for drying.  Avoid walking on wet floors.  Avoid hot utensils and knives.  Position shelves so they are not  too high or low.  Place commonly used objects within easy reach.  If necessary, use a sturdy step stool with a grab bar when reaching.  Keep electrical cables out of the way.  Do not use floor polish or wax that makes floors slippery. If you must use wax, use non-skid floor wax.  Remove throw rugs and tripping hazards from the floor. STAIRWAYS  Never leave objects on stairs.  Place handrails on both sides of stairways and use them. Fix any loose handrails. Make sure handrails on both sides of the stairways are as long as the stairs.  Check carpeting to make sure it is firmly attached along stairs. Make repairs to worn or loose carpet promptly.  Avoid placing throw rugs at the top or bottom of stairways, or properly secure the rug with carpet tape to prevent slippage. Get rid of throw rugs, if possible.  Have an electrician put in a light switch at the top and bottom of the stairs. OTHER FALL PREVENTION TIPS  Wear low-heel or rubber-soled shoes that are supportive and fit well. Wear closed toe shoes.  When using a stepladder, make sure it is fully opened and both spreaders are firmly locked. Do not climb a closed stepladder.  Add color or contrast paint or tape to grab bars and handrails in your home. Place contrasting color strips on first and last steps.  Learn and use mobility aids as needed. Install an electrical emergency response system.  Turn on lights to  avoid dark areas. Replace light bulbs that burn out immediately. Get light switches that glow.  Arrange furniture to create clear pathways. Keep furniture in the same place.  Firmly attach carpet with non-skid or double-sided tape.  Eliminate uneven floor surfaces.  Select a carpet pattern that does not visually hide the edge of steps.  Be aware of all pets. OTHER HOME SAFETY TIPS  Set the water temperature for 120 F (48.8 C).  Keep emergency numbers on or near the telephone.  Keep smoke detectors on every  level of the home and near sleeping areas. Document Released: 03/07/2002 Document Revised: 09/16/2011 Document Reviewed: 06/06/2011 [facility] Patient Information 2015 [place], [place]. This information is not intended to replace advice given to you by your health care provider. Make sure you discuss any questions you have with your health care provider.

## 2015-02-02 ENCOUNTER — Emergency Department (HOSPITAL_COMMUNITY): Payer: Medicare Other

## 2015-02-02 ENCOUNTER — Emergency Department (HOSPITAL_COMMUNITY)
Admission: EM | Admit: 2015-02-02 | Discharge: 2015-02-02 | Disposition: A | Discharge status: Home / Self Care | Payer: Medicare Other | Attending: Emergency Medicine | Admitting: Emergency Medicine

## 2015-02-02 ENCOUNTER — Other Ambulatory Visit: Payer: Self-pay

## 2015-02-02 ENCOUNTER — Encounter (HOSPITAL_COMMUNITY): Payer: Self-pay

## 2015-02-02 DIAGNOSIS — Y9289 Other specified places as the place of occurrence of the external cause: Secondary | ICD-10-CM | POA: Insufficient documentation

## 2015-02-02 DIAGNOSIS — H01006 Unspecified blepharitis left eye, unspecified eyelid: Secondary | ICD-10-CM | POA: Diagnosis not present

## 2015-02-02 DIAGNOSIS — S0081XA Abrasion of other part of head, initial encounter: Secondary | ICD-10-CM | POA: Insufficient documentation

## 2015-02-02 DIAGNOSIS — Y9389 Activity, other specified: Secondary | ICD-10-CM | POA: Diagnosis not present

## 2015-02-02 DIAGNOSIS — W07XXXA Fall from chair, initial encounter: Secondary | ICD-10-CM | POA: Diagnosis not present

## 2015-02-02 DIAGNOSIS — Z79899 Other long term (current) drug therapy: Secondary | ICD-10-CM | POA: Diagnosis not present

## 2015-02-02 DIAGNOSIS — F0390 Unspecified dementia without behavioral disturbance: Secondary | ICD-10-CM | POA: Insufficient documentation

## 2015-02-02 DIAGNOSIS — Y998 Other external cause status: Secondary | ICD-10-CM | POA: Diagnosis not present

## 2015-02-02 DIAGNOSIS — S0990XA Unspecified injury of head, initial encounter: Secondary | ICD-10-CM | POA: Diagnosis present

## 2015-02-02 DIAGNOSIS — Z87440 Personal history of urinary (tract) infections: Secondary | ICD-10-CM | POA: Diagnosis not present

## 2015-02-02 DIAGNOSIS — S0091XA Abrasion of unspecified part of head, initial encounter: Secondary | ICD-10-CM

## 2015-02-02 DIAGNOSIS — W19XXXA Unspecified fall, initial encounter: Secondary | ICD-10-CM

## 2015-02-02 MED ORDER — BACITRACIN ZINC 500 UNIT/GM EX OINT
TOPICAL_OINTMENT | Freq: Two times a day (BID) | CUTANEOUS | Status: DC
  Administered 2015-02-02: 1 via TOPICAL

## 2015-02-02 MED ORDER — ERYTHROMYCIN 5 MG/GM OP OINT
1.0000 "application " | TOPICAL_OINTMENT | Freq: Once | OPHTHALMIC | Status: AC
  Administered 2015-02-02: 1 via OPHTHALMIC
  Filled 2015-02-02: qty 3.5

## 2015-02-02 MED ORDER — ERYTHROMYCIN 5 MG/GM OP OINT: TOPICAL_OINTMENT | OPHTHALMIC | Status: AC

## 2015-02-02 NOTE — ED Notes (Addendum)
[facility]- pt coming from nursing home after a fall. Fall was unwitnessed by staff. Pt fell from chair to floor, abrasion noted to right forehead. Pt has hx of dementia but is at baseline per staff at facility. Pt does not take blood thinners.

## 2015-02-02 NOTE — Discharge Instructions (Signed)
No new injuries noted to head, neck, chest, or pelvis.  Use eye ointment in L eye twice daily for 1 week.  Abrasion An abrasion is a cut or scrape on the outer surface of your skin. An abrasion does not extend through all of the layers of your skin. It is important to care for your abrasion properly to prevent infection. CAUSES Most abrasions are caused by falling on or gliding across the ground or another surface. When your skin rubs on something, the outer and inner layer of skin rubs off.  SYMPTOMS A cut or scrape is the main symptom of this condition. The scrape may be bleeding, or it may appear red or pink. If there was an associated fall, there may be an underlying bruise. DIAGNOSIS An abrasion is diagnosed with a physical exam. TREATMENT Treatment for this condition depends on how large and deep the abrasion is. Usually, your abrasion will be cleaned with water and mild soap. This removes any dirt or debris that may be stuck. An antibiotic ointment may be applied to the abrasion to help prevent infection. A bandage (dressing) may be placed on the abrasion to keep it clean. You may also need a tetanus shot. HOME CARE INSTRUCTIONS Medicines  Take or apply medicines only as directed by your health care provider.  If you were prescribed an antibiotic ointment, finish all of it even if you start to feel better. Wound Care  Clean the wound with mild soap and water 2-3 times per day or as directed by your health care provider. Pat your wound dry with a clean towel. Do not rub it.  There are many different ways to close and cover a wound. Follow instructions from your health care provider about:  Wound care.  Dressing changes and removal.  Check your wound every day for signs of infection. Watch for:  Redness, swelling, or pain.  Fluid, blood, or pus. General Instructions  Keep the dressing dry as directed by your health care provider. Do not take baths, swim, use a hot tub, or do  anything that would put your wound underwater until your health care provider approves.  If there is swelling, raise (elevate) the injured area above the level of your heart while you are sitting or lying down.  Keep all follow-up visits as directed by your health care provider. This is important. SEEK MEDICAL CARE IF:  You received a tetanus shot and you have swelling, severe pain, redness, or bleeding at the injection site.  Your pain is not controlled with medicine.  You have increased redness, swelling, or pain at the site of your wound. SEEK IMMEDIATE MEDICAL CARE IF:  You have a red streak going away from your wound.  You have a fever.  You have fluid, blood, or pus coming from your wound.  You notice a bad smell coming from your wound or your dressing.   This information is not intended to replace advice given to you by your health care provider. Make sure you discuss any questions you have with your health care provider.   Document Released: 12/25/2004 Document Revised: 12/06/2014 Document Reviewed: 03/15/2014 Elsevier Interactive Patient Education 2016 [employer]    Blepharitis Blepharitis is inflammation of the eyelids. Blepharitis may happen with:  Reddish, scaly skin around the scalp and eyebrows.  Burning or itching of the eyelids.  Eye discharge at night that causes the eyelashes to stick together in the morning.  Eyelashes that fall out.  Sensitivity to light. HOME  CARE INSTRUCTIONS Pay attention to any changes in how you look or feel. Follow these instructions to help with your condition: Keeping Clean  Wash your hands often.  Wash your eyelids with warm water or with warm water that is mixed with a small amount of baby shampoo. Do this two times per day or as often as needed.  Wash your face and eyebrows at least once a day.  Use a clean towel each time you dry your eyelids. Do not use this towel to clean or dry other areas of your body. Do not  share your towel with anyone. General Instructions  Avoid wearing makeup until you get better. Do not share makeup with anyone.  Avoid rubbing your eyes.  Apply warm compresses to your eyes 2 times per day for 10 minutes at a time, or as told by your health care provider.  If you were prescribed an antibiotic ointment or steroid drops, apply or use the medicine as told by your health care provider. Do not stop using the medicine even if you feel better.  Keep all follow-up visits as told by your health care provider. This is important. SEEK MEDICAL CARE IF:  Your eyelids feel hot.  You have blisters or a rash on your eyelids.  The condition does not go away in 2-4 days.  The inflammation gets worse. SEEK IMMEDIATE MEDICAL CARE IF:  You have pain or redness that gets worse or spreads to other parts of your face.  Your vision changes.  You have pain when looking at lights or moving objects.  You have a fever.   This information is not intended to replace advice given to you by your health care provider. Make sure you discuss any questions you have with your health care provider.   Document Released: 03/14/2000 Document Revised: 12/06/2014 Document Reviewed: 07/10/2014 Elsevier Interactive Patient Education [employer]

## 2015-02-02 NOTE — ED Provider Notes (Signed)
CSN: [id number]     Arrival date & time 02/02/15  4098 History   First MD Initiated Contact with Patient 02/02/15 [phone]     Chief Complaint  Patient presents with  . Fall     (Consider location/radiation/quality/duration/timing/severity/associated sxs/prior Treatment) Patient is a 81 y.o. male presenting with fall. The history is provided by medical records and the EMS personnel. The history is limited by the condition of the patient.  Fall This is a recurrent problem. The current episode started today. The problem occurs intermittently. The problem has been unchanged. Nothing aggravates the symptoms. He has tried nothing for the symptoms.    Past Medical History  Diagnosis Date  . UTI (urinary tract infection)     HX OF UTI   Past Surgical History  Procedure Laterality Date  . Hernia repair     History reviewed. No pertinent family history. Social History  Substance Use Topics  . Smoking status: Never Smoker   . Smokeless tobacco: Never Used  . Alcohol Use: No    Review of Systems  Unable to perform ROS: Dementia  Skin: Positive for wound.      Allergies  Review of patient's allergies indicates no known allergies.  Home Medications   Prior to Admission medications   Medication Sig Start Date End Date Taking? Authorizing Provider  cyanocobalamin 500 MCG tablet Take 1 tablet (500 mcg total) by mouth daily. 12/21/13  Yes Osvaldo Shipper, MD  feeding supplement, ENSURE COMPLETE, (ENSURE COMPLETE) LIQD Take 237 mLs by mouth 3 (three) times daily between meals. 04/28/14  Yes Alysia Penna, MD  ferrous sulfate 325 (65 FE) MG tablet Take 325 mg by mouth at bedtime.   Yes Historical Provider, MD  Multiple Vitamins-Minerals (DECUBI-VITE PO) Take 1 tablet by mouth daily.   Yes Historical Provider, MD  nitrofurantoin (MACRODANTIN) 50 MG capsule Take 50 mg by mouth at bedtime.   Yes Historical Provider, MD  trimethoprim-polymyxin b (POLYTRIM) ophthalmic solution Place 2 drops into  the left eye 3 (three) times daily. For 7 days   Yes Historical Provider, MD  acetaminophen (TYLENOL) 325 MG tablet Take 2 tablets (650 mg total) by mouth every 4 (four) hours as needed for mild pain, fever or headache. 12/21/13   Osvaldo Shipper, MD  senna-docusate (SENOKOT-S) 8.6-50 MG per tablet Take 1 tablet by mouth at bedtime. Patient taking differently: Take 1 tablet by mouth at bedtime as needed for mild constipation.  12/21/13   Osvaldo Shipper, MD  traZODone (DESYREL) 50 MG tablet Take 0.5 tablets (25 mg total) by mouth at bedtime as needed for sleep. 12/21/13   Osvaldo Shipper, MD   BP 148/81 mmHg  Pulse 65  Temp(Src) 97.5 F (36.4 C) (Axillary)  SpO2 98% Physical Exam  Constitutional: He appears well-developed and well-nourished. No distress.  HENT:  Head: Normocephalic and atraumatic.  Right Ear: External ear normal.  Left Ear: External ear normal.  Nose: Nose normal.  Mouth/Throat: Oropharynx is clear and moist. No oropharyngeal exudate.  Eyes: Conjunctivae and EOM are normal. Pupils are equal, round, and reactive to light. Right eye exhibits no discharge. Left eye exhibits no discharge. No scleral icterus.  Neck: Normal range of motion. Neck supple. No JVD present. No tracheal deviation present. No thyromegaly present.  Cardiovascular: Normal rate, regular rhythm and intact distal pulses.   Pulmonary/Chest: Effort normal. No stridor. No respiratory distress. He has no wheezes. He has no rales. He exhibits no tenderness.  Abdominal: Soft. He exhibits no distension. There is  no tenderness.  Musculoskeletal: Normal range of motion. He exhibits no edema or tenderness.  Lymphadenopathy:    He has no cervical adenopathy.  Neurological: He is alert. He displays atrophy. He displays no tremor. No cranial nerve deficit or sensory deficit. He exhibits normal muscle tone. He displays no seizure activity.  Skin: Skin is warm and dry. Abrasion noted. No rash noted. He is not diaphoretic. No  erythema. No pallor.  Abrasion to R parietal area.  Psychiatric: His affect is blunt. He is withdrawn. Cognition and memory are impaired. He is noncommunicative.  Nursing note and vitals reviewed.   ED Course  Procedures (including critical care time) Labs Review Labs Reviewed - No data to display  Imaging Review Dg Chest 2 View  02/02/2015  CLINICAL DATA:  Unwitnessed fall at nursing home. Forehead abrasions. Initial encounter. EXAM: CHEST - 2 VIEW COMPARISON:  11/05/2014 and other prior chest x-rays. FINDINGS: Stable cardiac enlargement and tortuosity of the thoracic aorta. Stable elevation of the left hemidiaphragm with associated volume loss and atelectasis at the left lung base. Stable scarring at the right lung base. There is no evidence of pulmonary edema, consolidation, pneumothorax, nodule or pleural fluid. Stable healed right-sided rib fractures. Lateral view shows osteopenia of the thoracic spine and 2 probable significant compression fractures of a mid and lower thoracic vertebral body. These are difficult to appreciate on prior lateral chest x-rays from 2016 which were suboptimal in technique. Fractures appear to be new since 2015. IMPRESSION: No active pulmonary disease. Osteopenic compression fracture of mid and lower thoracic vertebral bodies. These appear to be likely new since 2015 but otherwise age indeterminate. Correlation suggested with any acute thoracic back pain. Electronically Signed   By: Irish Lack M.D.   On: 02/02/2015 10:02   Dg Pelvis 1-2 Views  02/02/2015  CLINICAL DATA:  Unwitnessed fall from chair at nursing home. EXAM: PELVIS - 1-2 VIEW COMPARISON:  None. FINDINGS: Diffuse osteopenia is noted. Hip joints appear intact. No definite fracture or dislocation is noted. IMPRESSION: No acute abnormality seen in the pelvis. Electronically Signed   By: Lupita Raider, M.D.   On: 02/02/2015 09:48   Ct Head Wo Contrast  02/02/2015  CLINICAL DATA:  Pain following fall  EXAM: CT HEAD WITHOUT CONTRAST CT CERVICAL SPINE WITHOUT CONTRAST TECHNIQUE: Multidetector CT imaging of the head and cervical spine was performed following the standard protocol without intravenous contrast. Multiplanar CT image reconstructions of the cervical spine were also generated. COMPARISON:  CT head and CT cervical spine November 05, 2014 FINDINGS: CT HEAD FINDINGS Moderate diffuse atrophy is stable. There is no intracranial mass, hemorrhage, extra-axial fluid collection, or midline shift. There is patchy small vessel disease throughout the centra semiovale bilaterally. Scattered foci of basal ganglia calcification is felt to be normal in this age group. No acute infarct is evident. Calcification is noted in each distal vertebral artery. The bony calvarium appears intact. The mastoid air cells are clear. There is stable sclerosis along the inferior right temporal bone. There is an air-fluid level in the right maxillary antrum. CT CERVICAL SPINE FINDINGS Bones are diffusely osteoporotic. There is no fracture. There is stable mild anterolisthesis of C4 on C5 and mild anterolisthesis of C7 on T1, felt to be due to underlying spondylosis. There is no new spondylolisthesis. The prevertebral soft tissues and predental space regions are normal. There is marked disc space narrowing at C5-6 and C6-7, stable. There is moderately severe narrowing at C3-4, stable. There is facet  hypertrophy at multiple levels, unchanged. These changes tend to be more severe on the right than on the left. There is extensive cystic change in the odontoid, stable. There are calcifications in both carotid arteries. There is scarring in the lung apices. IMPRESSION: CT head: Atrophy with periventricular small vessel disease, stable. No intracranial mass, hemorrhage, or extra-axial fluid collection. No acute infarct. There is an air-fluid level in the right maxillary antrum, a finding that may indicate acute sinusitis. No fracture evident.  Sclerosis in the inferior right temporal bone, stable. 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 CT cervical spine: Multilevel arthropathy, stable. Areas of mild spondylolisthesis at C4-5 and C7-T1 are stable and felt to be due to underlying spondylosis. No acute fracture. No new spondylolisthesis. Extensive cystic arthropathy in the odontoid is stable. Bones osteoporotic. Areas of arterial vascular calcification. Electronically Signed   By: Bretta Bang III M.D.   On: 02/02/2015 09:46   Ct Cervical Spine Wo Contrast  02/02/2015  CLINICAL DATA:  Pain following fall EXAM: CT HEAD WITHOUT CONTRAST CT CERVICAL SPINE WITHOUT CONTRAST TECHNIQUE: Multidetector CT imaging of the head and cervical spine was performed following the standard protocol without intravenous contrast. Multiplanar CT image reconstructions of the cervical spine were also generated. COMPARISON:  CT head and CT cervical spine November 05, 2014 FINDINGS: CT HEAD FINDINGS Moderate diffuse atrophy is stable. There is no intracranial mass, hemorrhage, extra-axial fluid collection, or midline shift. There is patchy small vessel disease throughout the centra semiovale bilaterally. Scattered foci of basal ganglia calcification is felt to be normal in this age group. No acute infarct is evident. Calcification is noted in each distal vertebral artery. The bony calvarium appears intact. The mastoid air cells are clear. There is stable sclerosis along the inferior right temporal bone. There is an air-fluid level in the right maxillary antrum. CT CERVICAL SPINE FINDINGS Bones are diffusely osteoporotic. There is no fracture. There is stable mild anterolisthesis of C4 on C5 and mild anterolisthesis of C7 on T1, felt to be due to underlying spondylosis. There is no new spondylolisthesis. The prevertebral soft tissues and predental space regions are normal. There is marked disc space narrowing at C5-6 and C6-7, stable. There  is moderately severe narrowing at C3-4, stable. There is facet hypertrophy at multiple levels, unchanged. These changes tend to be more severe on the right than on the left. There is extensive cystic change in the odontoid, stable. There are calcifications in both carotid arteries. There is scarring in the lung apices. IMPRESSION: CT head: Atrophy with periventricular small vessel disease, stable. No intracranial mass, hemorrhage, or extra-axial fluid collection. No acute infarct. There is an air-fluid level in the right maxillary antrum, a finding that may indicate acute sinusitis. No fracture evident. Sclerosis in the inferior right temporal bone, stable. 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 CT cervical spine: Multilevel arthropathy, stable. Areas of mild spondylolisthesis at C4-5 and C7-T1 are stable and felt to be due to underlying spondylosis. No acute fracture. No new spondylolisthesis. Extensive cystic arthropathy in the odontoid is stable. Bones osteoporotic. Areas of arterial vascular calcification. Electronically Signed   By: Bretta Bang III M.D.   On: 02/02/2015 09:46   I have personally reviewed and evaluated these images and lab results as part of my medical decision-making.   MDM   Final diagnoses:  Fall   Pt arrived after unwitnessed fall at SNF from chair.  Hx of frequent falls.  Dementia hx.  Very hard of hearing.  W/O complaints and no obvious extremity or abdomen/thorax pain to palpation on exam. No TTP of back or c-spine on exam.  CT imaging of head, C-spine, and plain films of chest and pelvis, are negative for acute findings. Compared imaging to upper spinal levels of prior A/P CT and T-spine had prior wedging of vertebral bodies.  Pt otherwise in NAD, VSS, AF.  Pt given erythromycin ointment for blepharitis vs. conjuctivitis to L eye.  Bacitracin to abrasions.  No other interventions required in the ED.  Pt instructions sent to SNF  with d/c paperwork given to EMS.  Advised PCP follow up in 1 week for repeat evaluation.  Sent with return precautions and instructions related to falls.  Patient care was discussed with my attending, Dr. Patria Maneampos.       Gavin PoundJustin Theia Dezeeuw, MD 02/02/15 1449  Azalia BilisKevin Campos, MD 02/02/15 [phone]

## 2015-02-07 ENCOUNTER — Emergency Department (HOSPITAL_COMMUNITY): Payer: Medicare Other

## 2015-02-07 ENCOUNTER — Encounter (HOSPITAL_COMMUNITY): Payer: Self-pay

## 2015-02-07 ENCOUNTER — Emergency Department (HOSPITAL_COMMUNITY)
Admission: EM | Admit: 2015-02-07 | Discharge: 2015-02-07 | Disposition: A | Discharge status: Home / Self Care | Payer: Medicare Other | Attending: Emergency Medicine | Admitting: Emergency Medicine

## 2015-02-07 DIAGNOSIS — S0001XA Abrasion of scalp, initial encounter: Secondary | ICD-10-CM | POA: Insufficient documentation

## 2015-02-07 DIAGNOSIS — Y92128 Other place in nursing home as the place of occurrence of the external cause: Secondary | ICD-10-CM | POA: Insufficient documentation

## 2015-02-07 DIAGNOSIS — Y9389 Activity, other specified: Secondary | ICD-10-CM | POA: Diagnosis not present

## 2015-02-07 DIAGNOSIS — Y998 Other external cause status: Secondary | ICD-10-CM | POA: Diagnosis not present

## 2015-02-07 DIAGNOSIS — Z79899 Other long term (current) drug therapy: Secondary | ICD-10-CM | POA: Diagnosis not present

## 2015-02-07 DIAGNOSIS — Z87440 Personal history of urinary (tract) infections: Secondary | ICD-10-CM | POA: Diagnosis not present

## 2015-02-07 DIAGNOSIS — W19XXXA Unspecified fall, initial encounter: Secondary | ICD-10-CM

## 2015-02-07 DIAGNOSIS — W1839XA Other fall on same level, initial encounter: Secondary | ICD-10-CM | POA: Diagnosis not present

## 2015-02-07 DIAGNOSIS — S0990XA Unspecified injury of head, initial encounter: Secondary | ICD-10-CM

## 2015-02-07 DIAGNOSIS — F0390 Unspecified dementia without behavioral disturbance: Secondary | ICD-10-CM

## 2015-02-07 LAB — BASIC METABOLIC PANEL
Anion gap: 8 (ref 5–15)
BUN: 32 mg/dL — ABNORMAL HIGH (ref 6–20)
CHLORIDE: 108 mmol/L (ref 101–111)
CO2: 24 mmol/L (ref 22–32)
Calcium: 9.1 mg/dL (ref 8.9–10.3)
Creatinine, Ser: 1.2 mg/dL (ref 0.61–1.24)
GFR, EST AFRICAN AMERICAN: 59 mL/min — AB (ref >60)
GFR, EST NON AFRICAN AMERICAN: 51 mL/min — AB (ref >60)
Glucose, Bld: 132 mg/dL — ABNORMAL HIGH (ref 65–99)
POTASSIUM: 4.2 mmol/L (ref 3.5–5.1)
SODIUM: 140 mmol/L (ref 135–145)

## 2015-02-07 LAB — CBC WITH DIFFERENTIAL/PLATELET
BASOS ABS: 0 10*3/uL (ref 0.0–0.1)
Basophils Relative: 0 %
Eosinophils Absolute: 0.2 10*3/uL (ref 0.0–0.7)
Eosinophils Relative: 2 %
HCT: 35 % — ABNORMAL LOW (ref 39.0–52.0)
HEMOGLOBIN: 11.4 g/dL — AB (ref 13.0–17.0)
LYMPHS ABS: 1.2 10*3/uL (ref 0.7–4.0)
LYMPHS PCT: 12 %
MCH: 30.3 pg (ref 26.0–34.0)
MCHC: 32.6 g/dL (ref 30.0–36.0)
MCV: 93.1 fL (ref 78.0–100.0)
Monocytes Absolute: 1.2 10*3/uL — ABNORMAL HIGH (ref 0.1–1.0)
Monocytes Relative: 11 %
Neutro Abs: 7.7 10*3/uL (ref 1.7–7.7)
Neutrophils Relative %: 75 %
Platelets: 224 10*3/uL (ref 150–400)
RBC: 3.76 MIL/uL — AB (ref 4.22–5.81)
RDW: 13.6 % (ref 11.5–15.5)
WBC: 10.4 10*3/uL (ref 4.0–10.5)

## 2015-02-07 LAB — TROPONIN I

## 2015-02-07 NOTE — ED Notes (Addendum)
[facility] called for transport back to facility 

## 2015-02-07 NOTE — Discharge Instructions (Signed)
Head Injury, Adult °You have a head injury. Headaches and throwing up (vomiting) are common after a head injury. It should be easy to wake up from sleeping. Sometimes you must stay in the hospital. Most problems happen within the first 24 hours. Side effects may occur up to 7-10 days after the injury.  °WHAT ARE THE TYPES OF HEAD INJURIES? °Head injuries can be as minor as a bump. Some head injuries can be more severe. More severe head injuries include: °· A jarring injury to the brain (concussion). °· A bruise of the brain (contusion). This mean there is bleeding in the brain that can cause swelling. °· A cracked skull (skull fracture). °· Bleeding in the brain that collects, clots, and forms a bump (hematoma). °WHEN SHOULD I GET HELP RIGHT AWAY?  °· You are confused or sleepy. °· You cannot be woken up. °· You feel sick to your stomach (nauseous) or keep throwing up (vomiting). °· Your dizziness or unsteadiness is getting worse. °· You have very bad, lasting headaches that are not helped by medicine. Take medicines only as told by your doctor. °· You cannot use your arms or legs like normal. °· You cannot walk. °· You notice changes in the black spots in the center of the colored part of your eye (pupil). °· You have clear or bloody fluid coming from your nose or ears. °· You have trouble seeing. °During the next 24 hours after the injury, you must stay with someone who can watch you. This person should get help right away (call 911 in the [place] if you start to shake and are not able to control it (have seizures), you pass out, or you are unable to wake up. °HOW CAN I PREVENT A HEAD INJURY IN THE FUTURE? °· Wear seat belts. °· Wear a helmet while bike riding and playing sports like football. °· Stay away from dangerous activities around the house. °WHEN CAN I RETURN TO NORMAL ACTIVITIES AND ATHLETICS? °See your doctor before doing these activities. You should not do normal activities or play contact sports until 1  week after the following symptoms have stopped: °· Headache that does not go away. °· Dizziness. °· Poor attention. °· Confusion. °· Memory problems. °· Sickness to your stomach or throwing up. °· Tiredness. °· Fussiness. °· Bothered by bright lights or loud noises. °· Anxiousness or depression. °· Restless sleep. °MAKE SURE YOU:  °· Understand these instructions. °· Will watch your condition. °· Will get help right away if you are not doing well or get worse. °  °This information is not intended to replace advice given to you by your health care provider. Make sure you discuss any questions you have with your health care provider. °  °Document Released: 02/28/2008 Document Revised: 04/07/2014 Document Reviewed: 11/22/2012 °Elsevier Interactive Patient Education ©2016 [employer] ° °

## 2015-02-07 NOTE — ED Notes (Signed)
Off floor for testing 

## 2015-02-07 NOTE — ED Provider Notes (Signed)
CSN: [id number]     Arrival date & time 02/07/15  1134 History   First MD Initiated Contact with Patient 02/07/15 1137     Chief Complaint  Patient presents with  . Fall  . Head Injury     (Consider location/radiation/quality/duration/timing/severity/associated sxs/prior Treatment) HPI Comments: Level 5 Caveat related to dementia. Pt states that he tripped and fell. Facility staff went in there and he was alert. Per staff he is acting at hit baseline.ems states that he is more sleepy here then when they picked him up  The history is provided by the EMS personnel. No language interpreter was used.    Past Medical History  Diagnosis Date  . UTI (urinary tract infection)     HX OF UTI   Past Surgical History  Procedure Laterality Date  . Hernia repair     No family history on file. Social History  Substance Use Topics  . Smoking status: Never Smoker   . Smokeless tobacco: Never Used  . Alcohol Use: No    Review of Systems  Unable to perform ROS: Dementia      Allergies  Review of patient's allergies indicates no known allergies.  Home Medications   Prior to Admission medications   Medication Sig Start Date End Date Taking? Authorizing Provider  acetaminophen (TYLENOL) 325 MG tablet Take 2 tablets (650 mg total) by mouth every 4 (four) hours as needed for mild pain, fever or headache. 12/21/13   Osvaldo ShipperGokul Krishnan, MD  cyanocobalamin 500 MCG tablet Take 1 tablet (500 mcg total) by mouth daily. 12/21/13   Osvaldo ShipperGokul Krishnan, MD  erythromycin ophthalmic ointment Place a 1/2 inch ribbon of ointment into the lower L eyelid.  BID x 7 days 02/02/15 02/08/15  Gavin PoundJustin Brooten, MD  feeding supplement, ENSURE COMPLETE, (ENSURE COMPLETE) LIQD Take 237 mLs by mouth 3 (three) times daily between meals. 04/28/14   Alysia PennaScott Holwerda, MD  ferrous sulfate 325 (65 FE) MG tablet Take 325 mg by mouth at bedtime.    Historical Provider, MD  Multiple Vitamins-Minerals (DECUBI-VITE PO) Take 1 tablet by mouth  daily.    Historical Provider, MD  nitrofurantoin (MACRODANTIN) 50 MG capsule Take 50 mg by mouth at bedtime.    Historical Provider, MD  senna-docusate (SENOKOT-S) 8.6-50 MG per tablet Take 1 tablet by mouth at bedtime. Patient taking differently: Take 1 tablet by mouth at bedtime as needed for mild constipation.  12/21/13   Osvaldo ShipperGokul Krishnan, MD  traZODone (DESYREL) 50 MG tablet Take 0.5 tablets (25 mg total) by mouth at bedtime as needed for sleep. 12/21/13   Osvaldo ShipperGokul Krishnan, MD  trimethoprim-polymyxin b (POLYTRIM) ophthalmic solution Place 2 drops into the left eye 3 (three) times daily. For 7 days    Historical Provider, MD   There were no vitals taken for this visit. Physical Exam  Constitutional: He appears well-developed and well-nourished.  HENT:  Abrasion to the right scalp  Cardiovascular: Normal rate and regular rhythm.   Pulmonary/Chest: Effort normal and breath sounds normal.  Musculoskeletal: Normal range of motion.  Moving all extremities without pain  Neurological: He is alert.  Oriented to place and self. Awakens with verbal stimulus but sleepy  Skin: Skin is warm and dry.  Psychiatric: He has a normal mood and affect.  Nursing note and vitals reviewed.   ED Course  Procedures (including critical care time) Labs Review Labs Reviewed  BASIC METABOLIC PANEL - Abnormal; Notable for the following:    Glucose, Bld 132 (*)  BUN 32 (*)    GFR calc non Af Amer 51 (*)    GFR calc Af Amer 59 (*)    All other components within normal limits  CBC WITH DIFFERENTIAL/PLATELET - Abnormal; Notable for the following:    RBC 3.76 (*)    Hemoglobin 11.4 (*)    HCT 35.0 (*)    Monocytes Absolute 1.2 (*)    All other components within normal limits  TROPONIN I  URINALYSIS, ROUTINE W REFLEX MICROSCOPIC (NOT AT [facility])    Imaging Review Dg Chest 2 View  02/07/2015  CLINICAL DATA:  Recent fall EXAM: CHEST - 2 VIEW COMPARISON:  07/04/2009 FINDINGS: Cardiac shadow is stable. Elevation  of left hemidiaphragm is again seen. Multiple healed rib fractures are again noted on the right. No focal infiltrate or sizable effusion is seen. Compression deformities are noted in the thoracic spine stable from the prior study. IMPRESSION: Chronic changes without acute abnormality. Electronically Signed   By: Alcide Clever M.D.   On: 02/07/2015 13:47   Ct Head Wo Contrast  02/07/2015  CLINICAL DATA:  Fall.  Head injury EXAM: CT HEAD WITHOUT CONTRAST CT CERVICAL SPINE WITHOUT CONTRAST TECHNIQUE: Multidetector CT imaging of the head and cervical spine was performed following the standard protocol without intravenous contrast. Multiplanar CT image reconstructions of the cervical spine were also generated. COMPARISON:  02/02/2015 FINDINGS: CT HEAD FINDINGS Moderate atrophy. Chronic microvascular ischemic changes in the white matter. Negative for acute infarct.  Negative for hemorrhage or mass lesion. Atherosclerotic calcification in the carotid and vertebral arteries. Air-fluid level right maxillary sinus with bony thickening consistent with chronic sinusitis. Mucosal edema in the ethmoid sinuses. Negative for skull fracture. CT CERVICAL SPINE FINDINGS 2 mm anterior slip C4-5. Diffuse disc degeneration and facet degeneration throughout the cervical spine. There is marked bony overgrowth of the facet on the right at C2-3 and C3-4. Cystic change in the dens appears degenerative. Negative for cervical spine fracture.  No mass lesion. Diffuse atherosclerotic calcification. IMPRESSION: Atrophy and chronic microvascular ischemia. No acute intracranial abnormality Diffuse atherosclerotic disease Cervical degenerative change multiple levels. Negative for cervical spine fracture. Electronically Signed   By: Marlan Palau M.D.   On: 02/07/2015 12:58   Ct Cervical Spine Wo Contrast  02/07/2015  CLINICAL DATA:  Fall.  Head injury EXAM: CT HEAD WITHOUT CONTRAST CT CERVICAL SPINE WITHOUT CONTRAST TECHNIQUE: Multidetector CT  imaging of the head and cervical spine was performed following the standard protocol without intravenous contrast. Multiplanar CT image reconstructions of the cervical spine were also generated. COMPARISON:  02/02/2015 FINDINGS: CT HEAD FINDINGS Moderate atrophy. Chronic microvascular ischemic changes in the white matter. Negative for acute infarct.  Negative for hemorrhage or mass lesion. Atherosclerotic calcification in the carotid and vertebral arteries. Air-fluid level right maxillary sinus with bony thickening consistent with chronic sinusitis. Mucosal edema in the ethmoid sinuses. Negative for skull fracture. CT CERVICAL SPINE FINDINGS 2 mm anterior slip C4-5. Diffuse disc degeneration and facet degeneration throughout the cervical spine. There is marked bony overgrowth of the facet on the right at C2-3 and C3-4. Cystic change in the dens appears degenerative. Negative for cervical spine fracture.  No mass lesion. Diffuse atherosclerotic calcification. IMPRESSION: Atrophy and chronic microvascular ischemia. No acute intracranial abnormality Diffuse atherosclerotic disease Cervical degenerative change multiple levels. Negative for cervical spine fracture. Electronically Signed   By: Marlan Palau M.D.   On: 02/07/2015 12:58   I have personally reviewed and evaluated these images and lab results as  part of my medical decision-making.  ED ECG REPORT   Date: 02/07/2015  Rate: 67  Rhythm: normal sinus rhythm  QRS Axis: normal  Intervals: normal  ST/T Wave abnormalities: normal  Conduction Disutrbances:none  Narrative Interpretation:   Old EKG Reviewed: unchanged  I have personally reviewed the EKG tracing and agree with the computerized printout as noted.   MDM   Final diagnoses:  Fall, initial encounter  Head injury, initial encounter  Dementia, without behavioral disturbance    No acute abnormality noted. Pt  Is okay to go back to assisted living.    Teressa Lower, NP 02/07/15  1428  Nelva Nay, MD 02/08/15 2306

## 2015-02-07 NOTE — ED Notes (Signed)
[facility] with c/o fall this morning, during which he struck his head.  He has edema/abrasion at upper right lat. Forehead area.  He is awake and confused, which is his normal mentation according to staff who regularly attend him.  This was an unwitnessed fall; however, they heard him fall and entered his room immediately after fall to find pt. Awake and in no distress.

## 2015-02-09 ENCOUNTER — Encounter (HOSPITAL_COMMUNITY): Payer: Self-pay | Admitting: Emergency Medicine

## 2015-02-09 ENCOUNTER — Emergency Department (HOSPITAL_COMMUNITY)
Admission: EM | Admit: 2015-02-09 | Discharge: 2015-02-09 | Disposition: A | Discharge status: Home / Self Care | Payer: Medicare Other | Attending: Emergency Medicine | Admitting: Emergency Medicine

## 2015-02-09 DIAGNOSIS — Y998 Other external cause status: Secondary | ICD-10-CM | POA: Diagnosis not present

## 2015-02-09 DIAGNOSIS — W1839XA Other fall on same level, initial encounter: Secondary | ICD-10-CM | POA: Diagnosis not present

## 2015-02-09 DIAGNOSIS — Z87440 Personal history of urinary (tract) infections: Secondary | ICD-10-CM | POA: Diagnosis not present

## 2015-02-09 DIAGNOSIS — S0990XA Unspecified injury of head, initial encounter: Secondary | ICD-10-CM | POA: Diagnosis not present

## 2015-02-09 DIAGNOSIS — S0532XA Ocular laceration without prolapse or loss of intraocular tissue, left eye, initial encounter: Secondary | ICD-10-CM | POA: Insufficient documentation

## 2015-02-09 DIAGNOSIS — Y9289 Other specified places as the place of occurrence of the external cause: Secondary | ICD-10-CM | POA: Insufficient documentation

## 2015-02-09 DIAGNOSIS — Z79899 Other long term (current) drug therapy: Secondary | ICD-10-CM | POA: Diagnosis not present

## 2015-02-09 DIAGNOSIS — Y9389 Activity, other specified: Secondary | ICD-10-CM | POA: Diagnosis not present

## 2015-02-09 DIAGNOSIS — W19XXXA Unspecified fall, initial encounter: Secondary | ICD-10-CM

## 2015-02-09 NOTE — ED Notes (Signed)
[facility] with c/o unwitnessed fall. Pt was found sitting in a chair with a bloody nose and a laceration to his L eyebrow, and states he remembers falling. Pt alert to self only - per EMS this is his baseline. Follows some commands, not all. Pt has 2-3 cm lac to L lateral eyebrow, bleeding controlled. No longer bleeding from nose.   Multiple visits this week for falls.

## 2015-02-09 NOTE — ED Provider Notes (Signed)
CSN: [id number]   Arrival date & time 02/09/15 0309  History  By signing my name below, I, Bethel Born, attest that this documentation has been prepared under the direction and in the presence of Azalia Bilis, MD. Electronically Signed: Bethel Born, ED Scribe. 02/09/2015. 3:38 AM.  Chief Complaint  Patient presents with  . Fall  . Head Injury   Level V caveat secondary to dementia   HPI The history is provided by the patient and the EMS personnel.   Brought in by EMS from [facility], Terrance Adams is a 81 y.o. male with dementia who presents to the Emergency Department complaining of an unwitnessed fall tonight at his nursing home. Pt was found by staff sitting in the chair with epistaxis and a laceration to his left eyebrow. Pt states that he has "very little pain".    Past Medical History  Diagnosis Date  . UTI (urinary tract infection)     HX OF UTI    Past Surgical History  Procedure Laterality Date  . Hernia repair      No family history on file.  Social History  Substance Use Topics  . Smoking status: Never Smoker   . Smokeless tobacco: Never Used  . Alcohol Use: No     Review of Systems  Unable to perform ROS: Dementia   . Home Medications   Prior to Admission medications   Medication Sig Start Date End Date Taking? Authorizing Provider  acetaminophen (TYLENOL) 325 MG tablet Take 2 tablets (650 mg total) by mouth every 4 (four) hours as needed for mild pain, fever or headache. 12/21/13   Osvaldo Shipper, MD  cyanocobalamin 500 MCG tablet Take 1 tablet (500 mcg total) by mouth daily. 12/21/13   Osvaldo Shipper, MD  feeding supplement, ENSURE COMPLETE, (ENSURE COMPLETE) LIQD Take 237 mLs by mouth 3 (three) times daily between meals. 04/28/14   Alysia Penna, MD  ferrous sulfate 325 (65 FE) MG tablet Take 325 mg by mouth at bedtime.    Historical Provider, MD  Multiple Vitamins-Minerals (DECUBI-VITE PO) Take 1 tablet by mouth daily.    Historical Provider,  MD  nitrofurantoin (MACRODANTIN) 50 MG capsule Take 50 mg by mouth at bedtime.    Historical Provider, MD  senna-docusate (SENOKOT-S) 8.6-50 MG per tablet Take 1 tablet by mouth at bedtime. Patient taking differently: Take 1 tablet by mouth at bedtime as needed for mild constipation.  12/21/13   Osvaldo Shipper, MD  traZODone (DESYREL) 50 MG tablet Take 0.5 tablets (25 mg total) by mouth at bedtime as needed for sleep. 12/21/13   Osvaldo Shipper, MD  trimethoprim-polymyxin b (POLYTRIM) ophthalmic solution Place 2 drops into the left eye 3 (three) times daily. For 7 days    Historical Provider, MD    Allergies  Review of patient's allergies indicates no known allergies.  Triage Vitals: BP 124/76 mmHg  Pulse 70  Temp(Src) 97.4 F (36.3 C) (Oral)  Resp 16  SpO2 98%  Physical Exam  Constitutional: He appears well-developed and well-nourished.  HENT:  Head: Normocephalic.  Small superficial laceration at the left supraorbital rim without active bleeding.   Eyes: EOM are normal.  Neck: Normal range of motion. Neck supple.  No cervical spine tenderness  Cardiovascular: Normal rate, regular rhythm, normal heart sounds and intact distal pulses.   Pulmonary/Chest: Effort normal and breath sounds normal. No respiratory distress.  Abdominal: Soft. He exhibits no distension. There is no tenderness.  Musculoskeletal: Normal range of motion.  FROM of  major joints of bilateral upper and lower extremities.   Neurological: He is alert.  Skin: Skin is warm and dry.  Psychiatric: He has a normal mood and affect. Judgment normal.  Nursing note and vitals reviewed.   ED Course  Procedures   DIAGNOSTIC STUDIES: Oxygen Saturation is 98% on RA, normal by my interpretation.    COORDINATION OF CARE:    Labs Reviewed - No data to display  Imaging Review Dg Chest 2 View  02/07/2015  CLINICAL DATA:  Recent fall EXAM: CHEST - 2 VIEW COMPARISON:  07/04/2009 FINDINGS: Cardiac shadow is stable. Elevation  of left hemidiaphragm is again seen. Multiple healed rib fractures are again noted on the right. No focal infiltrate or sizable effusion is seen. Compression deformities are noted in the thoracic spine stable from the prior study. IMPRESSION: Chronic changes without acute abnormality. Electronically Signed   By: Alcide CleverMark  Lukens M.D.   On: 02/07/2015 13:47   Ct Head Wo Contrast  02/07/2015  CLINICAL DATA:  Fall.  Head injury EXAM: CT HEAD WITHOUT CONTRAST CT CERVICAL SPINE WITHOUT CONTRAST TECHNIQUE: Multidetector CT imaging of the head and cervical spine was performed following the standard protocol without intravenous contrast. Multiplanar CT image reconstructions of the cervical spine were also generated. COMPARISON:  02/02/2015 FINDINGS: CT HEAD FINDINGS Moderate atrophy. Chronic microvascular ischemic changes in the white matter. Negative for acute infarct.  Negative for hemorrhage or mass lesion. Atherosclerotic calcification in the carotid and vertebral arteries. Air-fluid level right maxillary sinus with bony thickening consistent with chronic sinusitis. Mucosal edema in the ethmoid sinuses. Negative for skull fracture. CT CERVICAL SPINE FINDINGS 2 mm anterior slip C4-5. Diffuse disc degeneration and facet degeneration throughout the cervical spine. There is marked bony overgrowth of the facet on the right at C2-3 and C3-4. Cystic change in the dens appears degenerative. Negative for cervical spine fracture.  No mass lesion. Diffuse atherosclerotic calcification. IMPRESSION: Atrophy and chronic microvascular ischemia. No acute intracranial abnormality Diffuse atherosclerotic disease Cervical degenerative change multiple levels. Negative for cervical spine fracture. Electronically Signed   By: Marlan Palauharles  Clark M.D.   On: 02/07/2015 12:58   Ct Cervical Spine Wo Contrast  02/07/2015  CLINICAL DATA:  Fall.  Head injury EXAM: CT HEAD WITHOUT CONTRAST CT CERVICAL SPINE WITHOUT CONTRAST TECHNIQUE: Multidetector CT  imaging of the head and cervical spine was performed following the standard protocol without intravenous contrast. Multiplanar CT image reconstructions of the cervical spine were also generated. COMPARISON:  02/02/2015 FINDINGS: CT HEAD FINDINGS Moderate atrophy. Chronic microvascular ischemic changes in the white matter. Negative for acute infarct.  Negative for hemorrhage or mass lesion. Atherosclerotic calcification in the carotid and vertebral arteries. Air-fluid level right maxillary sinus with bony thickening consistent with chronic sinusitis. Mucosal edema in the ethmoid sinuses. Negative for skull fracture. CT CERVICAL SPINE FINDINGS 2 mm anterior slip C4-5. Diffuse disc degeneration and facet degeneration throughout the cervical spine. There is marked bony overgrowth of the facet on the right at C2-3 and C3-4. Cystic change in the dens appears degenerative. Negative for cervical spine fracture.  No mass lesion. Diffuse atherosclerotic calcification. IMPRESSION: Atrophy and chronic microvascular ischemia. No acute intracranial abnormality Diffuse atherosclerotic disease Cervical degenerative change multiple levels. Negative for cervical spine fracture. Electronically Signed   By: Marlan Palauharles  Clark M.D.   On: 02/07/2015 12:58    I personally reviewed and evaluated these images and lab results as a part of my medical decision-making.      MDM   Final  diagnoses:  Fall, initial encounter  Minor head injury, initial encounter    Minor head injury.  No use of anticoagulants.  Patient's artery had 2 head CTs this week.  I do not think he needs another one.  My suspicion for intracranial bleeding is very low.  C-spine is nontender.  Normal range of motion of bilateral upper and lower extremity major muscle groups.  Chest and abdomen are benign.  Discharge back to the nursing facility   I personally performed the services described in this documentation, which was scribed in my presence. The recorded  information has been reviewed and is accurate.       Azalia Bilis, MD 02/09/15 [phone]

## 2015-02-09 NOTE — ED Notes (Signed)
Pt has 1+ swelling around bilateeral eyes, per SNF records he has been receiving erythromycin ointment and drops. Pt also has bruising under both eyes, EMS was unable to report if bruising was present upon their arrival.
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
## 2015-03-01 ENCOUNTER — Emergency Department (HOSPITAL_COMMUNITY): Payer: Medicare Other

## 2015-03-01 ENCOUNTER — Emergency Department (HOSPITAL_COMMUNITY)
Admission: EM | Admit: 2015-03-01 | Discharge: 2015-03-02 | Disposition: A | Discharge status: Home / Self Care | Payer: Medicare Other | Attending: Emergency Medicine | Admitting: Emergency Medicine

## 2015-03-01 ENCOUNTER — Encounter (HOSPITAL_COMMUNITY): Payer: Self-pay | Admitting: Emergency Medicine

## 2015-03-01 DIAGNOSIS — S0001XA Abrasion of scalp, initial encounter: Secondary | ICD-10-CM | POA: Diagnosis not present

## 2015-03-01 DIAGNOSIS — Y998 Other external cause status: Secondary | ICD-10-CM | POA: Diagnosis not present

## 2015-03-01 DIAGNOSIS — F0390 Unspecified dementia without behavioral disturbance: Secondary | ICD-10-CM | POA: Insufficient documentation

## 2015-03-01 DIAGNOSIS — W1839XA Other fall on same level, initial encounter: Secondary | ICD-10-CM | POA: Diagnosis not present

## 2015-03-01 DIAGNOSIS — Y92128 Other place in nursing home as the place of occurrence of the external cause: Secondary | ICD-10-CM | POA: Insufficient documentation

## 2015-03-01 DIAGNOSIS — Z87440 Personal history of urinary (tract) infections: Secondary | ICD-10-CM | POA: Insufficient documentation

## 2015-03-01 DIAGNOSIS — Y9389 Activity, other specified: Secondary | ICD-10-CM | POA: Insufficient documentation

## 2015-03-01 DIAGNOSIS — Z79899 Other long term (current) drug therapy: Secondary | ICD-10-CM | POA: Insufficient documentation

## 2015-03-01 DIAGNOSIS — S0990XA Unspecified injury of head, initial encounter: Secondary | ICD-10-CM | POA: Diagnosis present

## 2015-03-01 DIAGNOSIS — W19XXXA Unspecified fall, initial encounter: Secondary | ICD-10-CM

## 2015-03-01 NOTE — ED Notes (Signed)
Patient is asleep so I woke him up.  I advised him he could go back to sleep.

## 2015-03-01 NOTE — ED Notes (Addendum)
The patient had an unwitnessed fall, they had just seen him after given meds so it was not long.  He was down maybe 5-9810minutes.  He denies LOC.  He does have some abrasions to the right side of his head.  Unknown what his baseline is but [facility].  According to EMS he is not on any blood thinners.  He denies any pain.

## 2015-03-01 NOTE — ED Provider Notes (Signed)
CSN: [id number]     Arrival date & time 03/01/15  1832 History   First MD Initiated Contact with Patient 03/01/15 1845     Chief Complaint  Patient presents with  . Fall    The patient had an unwitnessed fall, they had just seen him after given meds so it was not long.  He was down maybe 5-5410minutes.  He denies LOC.  He does have some abrasions to the right side of his head.     (Consider location/radiation/quality/duration/timing/severity/associated sxs/prior Treatment) HPI....... level V caveat for dementia. Status post fall today with abrasion to right parietal area. No change in behavior. No neck pain or extremity tenderness. [facility]  Past Medical History  Diagnosis Date  . UTI (urinary tract infection)     HX OF UTI   Past Surgical History  Procedure Laterality Date  . Hernia repair     History reviewed. No pertinent family history. Social History  Substance Use Topics  . Smoking status: Never Smoker   . Smokeless tobacco: Never Used  . Alcohol Use: No    Review of Systems  Reason unable to perform ROS: dementia.      Allergies  Review of patient's allergies indicates no known allergies.  Home Medications   Prior to Admission medications   Medication Sig Start Date End Date Taking? Authorizing Provider  acetaminophen (TYLENOL) 325 MG tablet Take 2 tablets (650 mg total) by mouth every 4 (four) hours as needed for mild pain, fever or headache. 12/21/13  Yes Osvaldo ShipperGokul Krishnan, MD  cyanocobalamin 500 MCG tablet Take 1 tablet (500 mcg total) by mouth daily. 12/21/13  Yes Osvaldo ShipperGokul Krishnan, MD  feeding supplement, ENSURE COMPLETE, (ENSURE COMPLETE) LIQD Take 237 mLs by mouth 3 (three) times daily between meals. 04/28/14  Yes Alysia PennaScott Holwerda, MD  ferrous sulfate 325 (65 FE) MG tablet Take 325 mg by mouth at bedtime.   Yes Historical Provider, MD  loperamide (IMODIUM A-D) 2 MG tablet Take 2 mg by mouth every 4 (four) hours  as needed for diarrhea or loose stools.   Yes Historical Provider, MD  Multiple Vitamins-Minerals (DECUBI-VITE PO) Take 1 tablet by mouth daily.   Yes Historical Provider, MD  nitrofurantoin (MACRODANTIN) 50 MG capsule Take 50 mg by mouth at bedtime.   Yes Historical Provider, MD  senna-docusate (SENOKOT-S) 8.6-50 MG per tablet Take 1 tablet by mouth at bedtime. Patient taking differently: Take 1 tablet by mouth at bedtime as needed for mild constipation.  12/21/13  Yes Osvaldo ShipperGokul Krishnan, MD  traZODone (DESYREL) 50 MG tablet Take 0.5 tablets (25 mg total) by mouth at bedtime as needed for sleep. 12/21/13  Yes Osvaldo ShipperGokul Krishnan, MD  trimethoprim-polymyxin b (POLYTRIM) ophthalmic solution Place 2 drops into the left eye 3 (three) times daily. For 7 days    Historical Provider, MD   BP 108/53 mmHg  Pulse 71  Temp(Src) 97.5 F (36.4 C) (Oral)  Resp 17  SpO2 93% Physical Exam  Constitutional: He is oriented to person, place, and time.  Demented, but pleasant.  HENT:  Head: Normocephalic and atraumatic.  Small abrasion on right parietal scalp  Eyes: Conjunctivae and EOM are normal. Pupils are equal, round, and reactive to light.  Neck: Normal range of motion. Neck supple.  No cervical tenderness.  Cardiovascular: Normal rate and regular rhythm.   Pulmonary/Chest: Effort normal and breath sounds normal.  Abdominal: Soft. Bowel sounds are normal.  Musculoskeletal: Normal  range of motion.  No extremity tenderness.  Neurological: He is alert and oriented to person, place, and time.  Skin: Skin is warm and dry.  Psychiatric: He has a normal mood and affect. His behavior is normal.  Nursing note and vitals reviewed.   ED Course  Procedures (including critical care time) Labs Review Labs Reviewed - No data to display  Imaging Review Ct Head Wo Contrast  03/01/2015  CLINICAL DATA:  81 year old male with fall. EXAM: CT HEAD WITHOUT CONTRAST TECHNIQUE: Contiguous axial images were obtained from the  base of the skull through the vertex without intravenous contrast. COMPARISON:  CT dated 02/07/2015 FINDINGS: The ventricles are dilated and the sulci are prominent compatible with age-related atrophy. Periventricular and deep white matter hypodensities represent chronic microvascular ischemic changes. There is no intracranial hemorrhage. No mass effect or midline shift identified. A 1.5 cm left maxillary sinus retention cyst or polyp. The visualized paranasal sinuses and mastoid air cells are otherwise well aerated. The calvarium is intact. IMPRESSION: No acute intracranial pathology. Age-related atrophy and chronic microvascular ischemic disease. Electronically Signed   By: Elgie Collard M.D.   On: 03/01/2015 20:26   I have personally reviewed and evaluated these images and lab results as part of my medical decision-making.   EKG Interpretation None      MDM   Final diagnoses:  Fall, initial encounter  Minor head injury, initial encounter    Patient appears to be at baseline. CT scan shows no acute pathology. Will d/c    Donnetta Hutching, MD 03/01/15 [phone]

## 2015-03-01 NOTE — Discharge Instructions (Signed)
CT scan of head was normal. Follow-up your primary care doctor.

## 2015-03-17 ENCOUNTER — Inpatient Hospital Stay (HOSPITAL_COMMUNITY)
Admission: EM | Admit: 2015-03-17 | Discharge: 2015-03-21 | DRG: 640 | Disposition: A | Discharge status: Hospice | Payer: Medicare Other | Attending: Internal Medicine | Admitting: Internal Medicine

## 2015-03-17 ENCOUNTER — Emergency Department (HOSPITAL_COMMUNITY): Payer: Medicare Other

## 2015-03-17 ENCOUNTER — Encounter (HOSPITAL_COMMUNITY): Payer: Self-pay | Admitting: *Deleted

## 2015-03-17 DIAGNOSIS — E162 Hypoglycemia, unspecified: Secondary | ICD-10-CM | POA: Diagnosis present

## 2015-03-17 DIAGNOSIS — Z66 Do not resuscitate: Secondary | ICD-10-CM | POA: Diagnosis present

## 2015-03-17 DIAGNOSIS — Z87440 Personal history of urinary (tract) infections: Secondary | ICD-10-CM

## 2015-03-17 DIAGNOSIS — G9341 Metabolic encephalopathy: Secondary | ICD-10-CM | POA: Diagnosis present

## 2015-03-17 DIAGNOSIS — R1012 Left upper quadrant pain: Secondary | ICD-10-CM | POA: Diagnosis not present

## 2015-03-17 DIAGNOSIS — E860 Dehydration: Principal | ICD-10-CM | POA: Diagnosis present

## 2015-03-17 DIAGNOSIS — F0390 Unspecified dementia without behavioral disturbance: Secondary | ICD-10-CM | POA: Diagnosis present

## 2015-03-17 DIAGNOSIS — R109 Unspecified abdominal pain: Secondary | ICD-10-CM | POA: Diagnosis present

## 2015-03-17 DIAGNOSIS — R4182 Altered mental status, unspecified: Secondary | ICD-10-CM | POA: Diagnosis present

## 2015-03-17 DIAGNOSIS — R509 Fever, unspecified: Secondary | ICD-10-CM | POA: Diagnosis present

## 2015-03-17 DIAGNOSIS — Z8659 Personal history of other mental and behavioral disorders: Secondary | ICD-10-CM

## 2015-03-17 DIAGNOSIS — R400 Somnolence: Secondary | ICD-10-CM | POA: Diagnosis not present

## 2015-03-17 DIAGNOSIS — G9340 Encephalopathy, unspecified: Secondary | ICD-10-CM | POA: Insufficient documentation

## 2015-03-17 DIAGNOSIS — D72829 Elevated white blood cell count, unspecified: Secondary | ICD-10-CM | POA: Diagnosis present

## 2015-03-17 DIAGNOSIS — D649 Anemia, unspecified: Secondary | ICD-10-CM | POA: Diagnosis present

## 2015-03-17 DIAGNOSIS — A419 Sepsis, unspecified organism: Secondary | ICD-10-CM

## 2015-03-17 HISTORY — DX: Dyspnea, unspecified: R06.00

## 2015-03-17 HISTORY — DX: Altered mental status, unspecified: R41.82

## 2015-03-17 LAB — CBC WITH DIFFERENTIAL/PLATELET
BASOS ABS: 0 10*3/uL (ref 0.0–0.1)
Basophils Relative: 0 %
EOS ABS: 0.6 10*3/uL (ref 0.0–0.7)
Eosinophils Relative: 4 %
HEMATOCRIT: 34.7 % — AB (ref 39.0–52.0)
Hemoglobin: 11.2 g/dL — ABNORMAL LOW (ref 13.0–17.0)
LYMPHS ABS: 1.2 10*3/uL (ref 0.7–4.0)
LYMPHS PCT: 8 %
MCH: 30.1 pg (ref 26.0–34.0)
MCHC: 32.3 g/dL (ref 30.0–36.0)
MCV: 93.3 fL (ref 78.0–100.0)
MONOS PCT: 8 %
Monocytes Absolute: 1.2 10*3/uL — ABNORMAL HIGH (ref 0.1–1.0)
Neutro Abs: 12 10*3/uL — ABNORMAL HIGH (ref 1.7–7.7)
Neutrophils Relative %: 80 %
PLATELETS: 284 10*3/uL (ref 150–400)
RBC: 3.72 MIL/uL — AB (ref 4.22–5.81)
RDW: 13.9 % (ref 11.5–15.5)
WBC: 15 10*3/uL — AB (ref 4.0–10.5)

## 2015-03-17 LAB — COMPREHENSIVE METABOLIC PANEL
ALK PHOS: 134 U/L — AB (ref 38–126)
ALT: 26 U/L (ref 17–63)
ANION GAP: 9 (ref 5–15)
AST: 30 U/L (ref 15–41)
Albumin: 3.1 g/dL — ABNORMAL LOW (ref 3.5–5.0)
BILIRUBIN TOTAL: 0.4 mg/dL (ref 0.3–1.2)
BUN: 23 mg/dL — ABNORMAL HIGH (ref 6–20)
CALCIUM: 8.9 mg/dL (ref 8.9–10.3)
CO2: 24 mmol/L (ref 22–32)
Chloride: 104 mmol/L (ref 101–111)
Creatinine, Ser: 1.13 mg/dL (ref 0.61–1.24)
GFR calc Af Amer: >60 mL/min (ref >60)
GFR, EST NON AFRICAN AMERICAN: 55 mL/min — AB (ref >60)
Glucose, Bld: 199 mg/dL — ABNORMAL HIGH (ref 65–99)
POTASSIUM: 4.2 mmol/L (ref 3.5–5.1)
Sodium: 137 mmol/L (ref 135–145)
TOTAL PROTEIN: 6.9 g/dL (ref 6.5–8.1)

## 2015-03-17 LAB — URINALYSIS, ROUTINE W REFLEX MICROSCOPIC
Bilirubin Urine: NEGATIVE
GLUCOSE, UA: NEGATIVE mg/dL
KETONES UR: NEGATIVE mg/dL
LEUKOCYTES UA: NEGATIVE
NITRITE: NEGATIVE
PROTEIN: NEGATIVE mg/dL
Specific Gravity, Urine: 1.026 (ref 1.005–1.030)
pH: 5 (ref 5.0–8.0)

## 2015-03-17 LAB — I-STAT CHEM 8, ED
BUN: 24 mg/dL — ABNORMAL HIGH (ref 6–20)
CALCIUM ION: 1.12 mmol/L — AB (ref 1.13–1.30)
Chloride: 104 mmol/L (ref 101–111)
Creatinine, Ser: 0.9 mg/dL (ref 0.61–1.24)
Glucose, Bld: 144 mg/dL — ABNORMAL HIGH (ref 65–99)
HEMATOCRIT: 36 % — AB (ref 39.0–52.0)
HEMOGLOBIN: 12.2 g/dL — AB (ref 13.0–17.0)
Potassium: 4 mmol/L (ref 3.5–5.1)
SODIUM: 138 mmol/L (ref 135–145)
TCO2: 21 mmol/L (ref 0–100)

## 2015-03-17 LAB — URINE MICROSCOPIC-ADD ON: BACTERIA UA: NONE SEEN

## 2015-03-17 LAB — I-STAT CG4 LACTIC ACID, ED
LACTIC ACID, VENOUS: 2.31 mmol/L — AB (ref 0.5–2.0)
Lactic Acid, Venous: 1.73 mmol/L (ref 0.5–2.0)

## 2015-03-17 MED ORDER — LORAZEPAM 2 MG/ML IJ SOLN
0.5000 mg | Freq: Once | INTRAMUSCULAR | Status: AC
  Administered 2015-03-17: 0.5 mg via INTRAVENOUS
  Filled 2015-03-17: qty 1

## 2015-03-17 MED ORDER — ACETAMINOPHEN 650 MG RE SUPP: 650.0000 mg | Freq: Four times a day (QID) | RECTAL | Status: DC | PRN

## 2015-03-17 MED ORDER — VANCOMYCIN HCL IN DEXTROSE 750-5 MG/150ML-% IV SOLN
750.0000 mg | INTRAVENOUS | Status: DC
  Administered 2015-03-18 – 2015-03-19 (×2): 750 mg via INTRAVENOUS
  Filled 2015-03-17 (×3): qty 150

## 2015-03-17 MED ORDER — SODIUM CHLORIDE 0.9 % IJ SOLN
3.0000 mL | Freq: Two times a day (BID) | INTRAMUSCULAR | Status: DC
  Administered 2015-03-17 – 2015-03-18 (×2): 3 mL via INTRAVENOUS

## 2015-03-17 MED ORDER — ONDANSETRON HCL 4 MG/2ML IJ SOLN: 4.0000 mg | Freq: Four times a day (QID) | INTRAMUSCULAR | Status: DC | PRN

## 2015-03-17 MED ORDER — SODIUM CHLORIDE 0.9 % IV SOLN
INTRAVENOUS | Status: AC
  Administered 2015-03-17: 23:00:00 via INTRAVENOUS

## 2015-03-17 MED ORDER — ENOXAPARIN SODIUM 40 MG/0.4ML ~~LOC~~ SOLN
40.0000 mg | SUBCUTANEOUS | Status: DC
  Administered 2015-03-17 – 2015-03-20 (×4): 40 mg via SUBCUTANEOUS
  Filled 2015-03-17 (×4): qty 0.4

## 2015-03-17 MED ORDER — PIPERACILLIN-TAZOBACTAM 3.375 G IVPB 30 MIN
3.3750 g | Freq: Once | INTRAVENOUS | Status: AC
  Administered 2015-03-17: 3.375 g via INTRAVENOUS
  Filled 2015-03-17: qty 50

## 2015-03-17 MED ORDER — ONDANSETRON HCL 4 MG PO TABS: 4.0000 mg | ORAL_TABLET | Freq: Four times a day (QID) | ORAL | Status: DC | PRN

## 2015-03-17 MED ORDER — PIPERACILLIN-TAZOBACTAM 3.375 G IVPB
3.3750 g | Freq: Three times a day (TID) | INTRAVENOUS | Status: DC
  Administered 2015-03-17 – 2015-03-20 (×8): 3.375 g via INTRAVENOUS
  Filled 2015-03-17 (×12): qty 50

## 2015-03-17 MED ORDER — VANCOMYCIN HCL IN DEXTROSE 1-5 GM/200ML-% IV SOLN
1000.0000 mg | Freq: Once | INTRAVENOUS | Status: AC
  Administered 2015-03-17: 1000 mg via INTRAVENOUS
  Filled 2015-03-17: qty 200

## 2015-03-17 MED ORDER — ACETAMINOPHEN 650 MG RE SUPP
650.0000 mg | Freq: Once | RECTAL | Status: AC
  Administered 2015-03-17: 650 mg via RECTAL
  Filled 2015-03-17: qty 1

## 2015-03-17 MED ORDER — SODIUM CHLORIDE 0.9 % IV BOLUS (SEPSIS)
1000.0000 mL | INTRAVENOUS | Status: AC
  Administered 2015-03-17 (×2): 1000 mL via INTRAVENOUS

## 2015-03-17 MED ORDER — ACETAMINOPHEN 325 MG PO TABS: 650.0000 mg | ORAL_TABLET | Freq: Four times a day (QID) | ORAL | Status: DC | PRN

## 2015-03-17 NOTE — ED Notes (Signed)
Called lab they stated cmp still in process.+

## 2015-03-17 NOTE — ED Provider Notes (Signed)
CSN: [id number]     Arrival date & time 03/17/15  1532 History   First MD Initiated Contact with Patient 03/17/15 1533     Chief Complaint  Patient presents with  . Altered Mental Status     (Consider location/radiation/quality/duration/timing/severity/associated sxs/prior Treatment) HPI Comments: 81 year old male with history of dementia and nursing home, anemia , urine infection presents with alered mental status worsenng for last 3 days. No focal neuro deficits. Unsure if full code, DNR on one report per EMS.  No known fevers, no family at bedside.  UTI hx.  Mild cough per report.   Patient is a 81 y.o. male presenting with altered mental status. The history is provided by medical records and the EMS personnel.  Altered Mental Status   Past Medical History  Diagnosis Date  . UTI (urinary tract infection)     HX OF UTI  . Dyspnea   . Altered mental status    Past Surgical History  Procedure Laterality Date  . Hernia repair     No family history on file. Social History  Substance Use Topics  . Smoking status: Never Smoker   . Smokeless tobacco: Never Used  . Alcohol Use: No    Review of Systems  Unable to perform ROS: Mental status change      Allergies  Review of patient's allergies indicates no known allergies.  Home Medications   Prior to Admission medications   Medication Sig Start Date End Date Taking? Authorizing Provider  acetaminophen (TYLENOL) 325 MG tablet Take 2 tablets (650 mg total) by mouth every 4 (four) hours as needed for mild pain, fever or headache. Patient taking differently: Take 650 mg by mouth See admin instructions. Take 2 tablets (650 mg) by mouth every 6 hours at 6am, 12pm, 6pm, 12am, may also take 2 tablets (650 mg) every 4 hours as needed for pain 12/21/13  Yes Osvaldo ShipperGokul Krishnan, MD  cyanocobalamin 500 MCG tablet Take 1 tablet (500 mcg total) by mouth daily. Patient taking differently: Take 500 mcg by mouth daily. Vitamin B12 12/21/13  Yes  Osvaldo ShipperGokul Krishnan, MD  feeding supplement, ENSURE COMPLETE, (ENSURE COMPLETE) LIQD Take 237 mLs by mouth 3 (three) times daily between meals. Patient taking differently: Take 237 mLs by mouth 3 (three) times daily. 8am, 2pm, 8pm 04/28/14  Yes Alysia PennaScott Holwerda, MD  ferrous sulfate (FEOSOL) 325 (65 FE) MG tablet Take 325 mg by mouth at bedtime. Gluten free   Yes Historical Provider, MD  Loperamide HCl (IMODIUM PO) Take 1 tablet by mouth every 4 (four) hours as needed (diarrhea).   Yes Historical Provider, MD  Multiple Vitamins-Minerals (DECUBI-VITE PO) Take 1 tablet by mouth daily.   Yes Historical Provider, MD  nitrofurantoin (MACRODANTIN) 50 MG capsule Take 50 mg by mouth at bedtime.   Yes Historical Provider, MD  senna-docusate (SENOKOT-S) 8.6-50 MG per tablet Take 1 tablet by mouth at bedtime. Patient taking differently: Take 1 tablet by mouth daily as needed for mild constipation.  12/21/13  Yes Osvaldo ShipperGokul Krishnan, MD  traZODone (DESYREL) 50 MG tablet Take 0.5 tablets (25 mg total) by mouth at bedtime as needed for sleep. 12/21/13  Yes Osvaldo ShipperGokul Krishnan, MD   BP 127/115 mmHg  Pulse 81  Temp(Src) 101.2 F (38.4 C) (Rectal)  Resp 20  Wt 126 lb (57.153 kg)  SpO2 94% Physical Exam  Constitutional: He appears well-developed.  HENT:  Head: Normocephalic and atraumatic.  Dry mm  Eyes: Right eye exhibits no discharge. Left eye exhibits no discharge.  Neck: Neck supple. No tracheal deviation present.  Cardiovascular: Normal rate and regular rhythm.   Pulmonary/Chest: Effort normal and breath sounds normal.  Abdominal: Soft. He exhibits no distension. There is no tenderness. There is no guarding.  Musculoskeletal: He exhibits no edema.  Neurological: A cranial nerve deficit is present. GCS eye subscore is 3. GCS verbal subscore is 2. GCS motor subscore is 4.  General encephalopathy, neck supple Difficult exam, responds to pain, pupils equal gen weakness all extremities  Skin: Skin is warm. No rash noted.   Psychiatric:  encephaloapthic  Nursing note and vitals reviewed.   ED Course  Procedures (including critical care time) Labs Review Labs Reviewed  CBC WITH DIFFERENTIAL/PLATELET - Abnormal; Notable for the following:    RBC 3.72 (*)    Hemoglobin 11.2 (*)    HCT 34.7 (*)    All other components within normal limits  URINALYSIS, ROUTINE W REFLEX MICROSCOPIC (NOT AT [facility]) - Abnormal; Notable for the following:    Hgb urine dipstick TRACE (*)    All other components within normal limits  URINE MICROSCOPIC-ADD ON - Abnormal; Notable for the following:    Squamous Epithelial / LPF 0-5 (*)    Crystals URIC ACID CRYSTALS (*)    All other components within normal limits  I-STAT CG4 LACTIC ACID, ED - Abnormal; Notable for the following:    Lactic Acid, Venous 2.31 (*)    All other components within normal limits  I-STAT CHEM 8, ED - Abnormal; Notable for the following:    BUN 24 (*)    Glucose, Bld 144 (*)    Calcium, Ion 1.12 (*)    Hemoglobin 12.2 (*)    HCT 36.0 (*)    All other components within normal limits  CULTURE, BLOOD (ROUTINE X 2)  CULTURE, BLOOD (ROUTINE X 2)  URINE CULTURE  COMPREHENSIVE METABOLIC PANEL  INFLUENZA PANEL BY PCR (TYPE A & B, H1N1)  I-STAT CG4 LACTIC ACID, ED    Imaging Review Dg Chest Port 1 View  03/17/2015  CLINICAL DATA:  81 year old male with sepsis and altered mental status. EXAM: PORTABLE CHEST 1 VIEW COMPARISON:  02/07/2015 and prior radiographs FINDINGS: Cardiomediastinal silhouette is unchanged. Bibasilar scarring is again noted. No definite airspace disease, pleural effusion or pneumothorax. Elevation of the left hemidiaphragm is again noted. IMPRESSION: No evidence of acute abnormality. Electronically Signed   By: Harmon Pier M.D.   On: 03/17/2015 16:58   I have personally reviewed and evaluated these images and lab results as part of my medical decision-making.   EKG Interpretation   Date/Time:  Saturday March 17 2015 16:21:52  EST Ventricular Rate:  78 PR Interval:  165 QRS Duration: 100 QT Interval:  394 QTC Calculation: 449 R Axis:   55 Text Interpretation:  Sinus rhythm Abnormal R-wave progression, early  transition Confirmed by Jovani Colquhoun  MD, [place] Souter ([id number]) on 03/17/2015 4:35:21 PM      MDM   Final diagnoses:  Sepsis, unspecified organism (HCC)  Acute encephalopathy   Patient presents with concern for sepsis, likely UTI from NH. Partially treated, finished cipro.  No family at bedside. Sepsis screening done, broad abx. Plan for admission.  [facility] agreed with tele and will see pt in ED.   The patients results and plan were reviewed and discussed.   Any x-rays performed were independently reviewed by myself.   Differential diagnosis were considered with the presenting HPI.  Medications  vancomycin (VANCOCIN) IVPB 750 mg/150 ml premix (not administered)  piperacillin-tazobactam (  ZOSYN) IVPB 3.375 g (not administered)  sodium chloride 0.9 % bolus 1,000 mL (1,000 mLs Intravenous New Bag/Given 03/17/15 1718)  piperacillin-tazobactam (ZOSYN) IVPB 3.375 g (0 g Intravenous Stopped 03/17/15 1647)  vancomycin (VANCOCIN) IVPB 1000 mg/200 mL premix (1,000 mg Intravenous New Bag/Given 03/17/15 1716)  acetaminophen (TYLENOL) suppository 650 mg (650 mg Rectal Given 03/17/15 1617)    Filed Vitals:   03/17/15 1700 03/17/15 1715 03/17/15 1745 03/17/15 1800  BP:  105/54 118/47 127/115  Pulse: 72 70 68 81  Temp:      TempSrc:      Resp: Weight:      SpO2: 98% 98% 100% 94%    Final diagnoses:  Sepsis, unspecified organism (HCC)  Acute encephalopathy    Admission/ observation were discussed with the admitting physician, patient and/or family and they are comfortable with the plan.      Blane Ohara, MD 03/17/15 [phone]

## 2015-03-17 NOTE — ED Notes (Signed)
Pt attempting to get out of bed

## 2015-03-17 NOTE — ED Notes (Signed)
PT becoming increasingly anxious.  Calling out "help, help".  Pt also pulled out IV.

## 2015-03-17 NOTE — ED Notes (Signed)
[facility] for increased altered mental status.  Hx of frequent UTI's, just finished tx of cipro on 03/05/15.  EMS vs 101.4, hr 82, bp 118/52, 97% RA.

## 2015-03-17 NOTE — Progress Notes (Addendum)
ANTIBIOTIC CONSULT NOTE - INITIAL  Pharmacy Consult for vanc/zosyn Indication: rule out sepsis  No Known Allergies  Patient Measurements: Weight: 126 lb (57.153 kg) Adjusted Body Weight:   Vital Signs: Temp: 101.2 F (38.4 C) (12/17 1550) Temp Source: Rectal (12/17 1550) BP: 127/48 mmHg (12/17 1600) Pulse Rate: 82 (12/17 1600) Intake/Output from previous day:   Intake/Output from this shift:    Labs: No results for input(s): WBC, HGB, PLT, LABCREA, CREATININE in the last 72 hours. CrCl cannot be calculated (Unknown ideal weight.). No results for input(s): VANCOTROUGH, VANCOPEAK, VANCORANDOM, GENTTROUGH, GENTPEAK, GENTRANDOM, TOBRATROUGH, TOBRAPEAK, TOBRARND, AMIKACINPEAK, AMIKACINTROU, AMIKACIN in the last 72 hours.   Microbiology: No results found for this or any previous visit (from the past 720 hour(s)).  Medical History: Past Medical History  Diagnosis Date  . UTI (urinary tract infection)     HX OF UTI  . Dyspnea   . Altered mental status     Medications:  Scheduled:   Infusions:  . piperacillin-tazobactam 3.375 g (03/17/15 1617)  . sodium chloride 1,000 mL (03/17/15 1605)  . vancomycin     Assessment: 81 yo who presented with AMS. He came from SNF. He has a hx UTI. Vanc/zosyn have been ordered to r/o sepsis.   Goal of Therapy:  Vancomycin trough level 15-20 mcg/ml  Plan:   Vanc 1g IV x1 then 750mg  IV q24 Zosyn 3.375g IV q8 Vanc trough as needed  Ulyses SouthwardMinh Pham, PharmD Pager: [phone] 03/17/2015 4:29 PM

## 2015-03-17 NOTE — H&P (Signed)
[facility] History and Physical  Terrance Adams YQI:[id number] DOB: [birth year] DOA: 03/17/2015  Referring physician: Blane OharaJoshua Zavitz, MD PCP: [name],[name], MD   Chief Complaint: Altered mental status  HPI: Terrance Adams is a 81 y.o. male with a past medical history of dementia, frequent UTIs, anemia who was referred from his nursing home facility for evaluation of altered mental status and fever. Apparently the patient has been having worsening dementia symptoms for the past 3 days, similar to the symptoms that he gets when he becomes uroseptic. No family member available and the patient is unable to provide further history at this time.   Review of Systems:  Unable to obtain.   Past Medical History  Diagnosis Date  . UTI (urinary tract infection)     HX OF UTI  . Dyspnea   . Altered mental status    Past Surgical History  Procedure Laterality Date  . Hernia repair     Social History:  reports that he has never smoked. He has never used smokeless tobacco. He reports that he does not drink alcohol or use illicit drugs.  No Known Allergies  No family history on file.  Prior to Admission medications   Medication Sig Start Date End Date Taking? Authorizing Provider  acetaminophen (TYLENOL) 325 MG tablet Take 2 tablets (650 mg total) by mouth every 4 (four) hours as needed for mild pain, fever or headache. Patient taking differently: Take 650 mg by mouth See admin instructions. Take 2 tablets (650 mg) by mouth every 6 hours at 6am, 12pm, 6pm, 12am, may also take 2 tablets (650 mg) every 4 hours as needed for pain 12/21/13  Yes Osvaldo ShipperGokul Krishnan, MD  cyanocobalamin 500 MCG tablet Take 1 tablet (500 mcg total) by mouth daily. Patient taking differently: Take 500 mcg by mouth daily. Vitamin B12 12/21/13  Yes Osvaldo ShipperGokul Krishnan, MD  feeding supplement, ENSURE COMPLETE, (ENSURE COMPLETE) LIQD Take 237 mLs by mouth 3 (three) times daily between meals. Patient taking differently:  Take 237 mLs by mouth 3 (three) times daily. 8am, 2pm, 8pm 04/28/14  Yes Alysia PennaScott Holwerda, MD  ferrous sulfate (FEOSOL) 325 (65 FE) MG tablet Take 325 mg by mouth at bedtime. Gluten free   Yes Historical Provider, MD  Loperamide HCl (IMODIUM PO) Take 1 tablet by mouth every 4 (four) hours as needed (diarrhea).   Yes Historical Provider, MD  Multiple Vitamins-Minerals (DECUBI-VITE PO) Take 1 tablet by mouth daily.   Yes Historical Provider, MD  nitrofurantoin (MACRODANTIN) 50 MG capsule Take 50 mg by mouth at bedtime.   Yes Historical Provider, MD  senna-docusate (SENOKOT-S) 8.6-50 MG per tablet Take 1 tablet by mouth at bedtime. Patient taking differently: Take 1 tablet by mouth daily as needed for mild constipation.  12/21/13  Yes Osvaldo ShipperGokul Krishnan, MD  traZODone (DESYREL) 50 MG tablet Take 0.5 tablets (25 mg total) by mouth at bedtime as needed for sleep. 12/21/13  Yes Osvaldo ShipperGokul Krishnan, MD   Physical Exam: Filed Vitals:   03/17/15 1900 03/17/15 1946 03/17/15 1956 03/17/15 2022  BP: 125/50 115/65  116/79  Pulse: 44   63  Temp:   99.1 F (37.3 C) 98.7 F (37.1 C)  TempSrc:   Axillary Oral  Resp: 18   18  Weight:      SpO2:    87%    Wt Readings from Last 3 Encounters:  03/17/15 57.153 kg (126 lb)  05/17/14 57.335 kg (126 lb 6.4 oz)  04/28/14 55.5 kg (122 lb  5.7 oz)    General: Confused and nonverbal. Eyes: PERRL, normal lids, irises & conjunctiva ENT: grossly normal hearing, lips & tongue are dry. Neck: no LAD, masses or thyromegaly Cardiovascular: RRR, no m/r/g. No LE edema. Telemetry: SR, no arrhythmias  Respiratory: CTA bilaterally, no w/r/r. Normal respiratory effort. Abdomen: soft, ntnd Skin: no rash or induration seen on limited exam Musculoskeletal: grossly normal tone BUE/BLE Psychiatric: Unable to evaluate Neurologic: Moves all extremities. Unable to fully evaluate due to altered mental status           Labs on Admission:  Basic Metabolic Panel:  Recent Labs Lab  03/17/15 1625 03/17/15 1832  NA 137 138  K 4.2 4.0  CL 104 104  CO2 24  --   GLUCOSE 199* 144*  BUN 23* 24*  CREATININE 1.13 0.90  CALCIUM 8.9  --    Liver Function Tests:  Recent Labs Lab 03/17/15 1625  AST 30  ALT 26  ALKPHOS 134*  BILITOT 0.4  PROT 6.9  ALBUMIN 3.1*   CBC:  Recent Labs Lab 03/17/15 1625 03/17/15 1832  WBC 15.0*  --   NEUTROABS 12.0*  --   HGB 11.2* 12.2*  HCT 34.7* 36.0*  MCV 93.3  --   PLT 284  --     Radiological Exams on Admission: Dg Chest Port 1 View  03/17/2015  CLINICAL DATA:  81 year old male with sepsis and altered mental status. EXAM: PORTABLE CHEST 1 VIEW COMPARISON:  02/07/2015 and prior radiographs FINDINGS: Cardiomediastinal silhouette is unchanged. Bibasilar scarring is again noted. No definite airspace disease, pleural effusion or pneumothorax. Elevation of the left hemidiaphragm is again noted. IMPRESSION: No evidence of acute abnormality. Electronically Signed   By: Harmon Pier M.D.   On: 03/17/2015 16:58    EKG Independently reviewed. Vent. rate 78 BPM PR interval 165 ms QRS duration 100 ms QT/QTc 394/449 ms P-R-T axes 68 55 56 Sinus rhythm Abnormal R-wave progression, early transition  Assessment/Plan Principal Problem:   Altered mental status Although the patient has been febrile, this episode does not seem to be related to an UTI. No specific source of infection at this time so will continue broad-spectrum antibiotics. Continue gentle hydration. Follow-up blood cultures and sensitivity.  Active Problems:   Leukocytosis Continue IV antibiotic therapy. Follow-up blood cultures. Monitor WBC.    Normocytic anemia Follow-up hematocrit and hemoglobin.    History of dementia Continue supportive care. Continue medical management of sepsis.    Code Status: DNR/DNI DVT Prophylaxis: Lovenox SQ Family Communication:  Disposition Plan: Admit to telemetry for IV antibiotic therapy and hydration.  Time spent:  Over 70 minutes were spent in the process of this admission.  Bobette Mo [facility] Pager [phone].

## 2015-03-18 ENCOUNTER — Other Ambulatory Visit: Payer: Self-pay | Admitting: Physician Assistant

## 2015-03-18 ENCOUNTER — Inpatient Hospital Stay (HOSPITAL_COMMUNITY): Payer: Medicare Other

## 2015-03-18 DIAGNOSIS — R400 Somnolence: Secondary | ICD-10-CM

## 2015-03-18 DIAGNOSIS — G9340 Encephalopathy, unspecified: Secondary | ICD-10-CM

## 2015-03-18 LAB — INFLUENZA PANEL BY PCR (TYPE A & B)
H1N1 flu by pcr: NOT DETECTED
Influenza A By PCR: NEGATIVE
Influenza B By PCR: NEGATIVE

## 2015-03-18 LAB — COMPREHENSIVE METABOLIC PANEL
ALT: 24 U/L (ref 17–63)
ANION GAP: 6 (ref 5–15)
AST: 24 U/L (ref 15–41)
Albumin: 2.5 g/dL — ABNORMAL LOW (ref 3.5–5.0)
Alkaline Phosphatase: 112 U/L (ref 38–126)
BUN: 14 mg/dL (ref 6–20)
CHLORIDE: 107 mmol/L (ref 101–111)
CO2: 26 mmol/L (ref 22–32)
Calcium: 8.3 mg/dL — ABNORMAL LOW (ref 8.9–10.3)
Creatinine, Ser: 1.04 mg/dL (ref 0.61–1.24)
GFR calc non Af Amer: >60 mL/min (ref >60)
Glucose, Bld: 91 mg/dL (ref 65–99)
POTASSIUM: 4.6 mmol/L (ref 3.5–5.1)
SODIUM: 139 mmol/L (ref 135–145)
Total Bilirubin: 0.6 mg/dL (ref 0.3–1.2)
Total Protein: 5.9 g/dL — ABNORMAL LOW (ref 6.5–8.1)

## 2015-03-18 LAB — CBC WITH DIFFERENTIAL/PLATELET
Basophils Absolute: 0 10*3/uL (ref 0.0–0.1)
Basophils Relative: 0 %
EOS ABS: 0.8 10*3/uL — AB (ref 0.0–0.7)
EOS PCT: 7 %
HCT: 31.5 % — ABNORMAL LOW (ref 39.0–52.0)
Hemoglobin: 10.1 g/dL — ABNORMAL LOW (ref 13.0–17.0)
LYMPHS ABS: 1.6 10*3/uL (ref 0.7–4.0)
Lymphocytes Relative: 15 %
MCH: 29.9 pg (ref 26.0–34.0)
MCHC: 32.1 g/dL (ref 30.0–36.0)
MCV: 93.2 fL (ref 78.0–100.0)
Monocytes Absolute: 1.4 10*3/uL — ABNORMAL HIGH (ref 0.1–1.0)
Monocytes Relative: 13 %
Neutro Abs: 7 10*3/uL (ref 1.7–7.7)
Neutrophils Relative %: 65 %
PLATELETS: 269 10*3/uL (ref 150–400)
RBC: 3.38 MIL/uL — AB (ref 4.22–5.81)
RDW: 13.9 % (ref 11.5–15.5)
WBC: 10.7 10*3/uL — AB (ref 4.0–10.5)

## 2015-03-18 LAB — MRSA PCR SCREENING: MRSA BY PCR: NEGATIVE

## 2015-03-18 LAB — GLUCOSE, CAPILLARY
GLUCOSE-CAPILLARY: 99 mg/dL (ref 65–99)
GLUCOSE-CAPILLARY: 91 mg/dL (ref 65–99)
Glucose-Capillary: 175 mg/dL — ABNORMAL HIGH (ref 65–99)

## 2015-03-18 LAB — PHOSPHORUS: PHOSPHORUS: 3.1 mg/dL (ref 2.5–4.6)

## 2015-03-18 LAB — MAGNESIUM: Magnesium: 2.1 mg/dL (ref 1.7–2.4)

## 2015-03-18 LAB — TSH: TSH: 0.59 u[IU]/mL (ref 0.350–4.500)

## 2015-03-18 LAB — VITAMIN B12: Vitamin B-12: 1006 pg/mL — ABNORMAL HIGH (ref 180–914)

## 2015-03-18 LAB — LIPASE, BLOOD: LIPASE: 23 U/L (ref 11–51)

## 2015-03-18 MED ORDER — LORAZEPAM 2 MG/ML IJ SOLN: 1.0000 mg | Freq: Once | INTRAMUSCULAR | Status: DC

## 2015-03-18 MED ORDER — SODIUM CHLORIDE 0.9 % IV SOLN
INTRAVENOUS | Status: DC
  Administered 2015-03-18 – 2015-03-21 (×5): via INTRAVENOUS

## 2015-03-18 MED ORDER — IOHEXOL 300 MG/ML  SOLN
100.0000 mL | Freq: Once | INTRAMUSCULAR | Status: AC | PRN
  Administered 2015-03-18: 80 mL via INTRAVENOUS

## 2015-03-18 MED ORDER — LORAZEPAM 2 MG/ML IJ SOLN
0.5000 mg | Freq: Once | INTRAMUSCULAR | Status: AC
  Administered 2015-03-18: 0.5 mg via INTRAVENOUS
  Filled 2015-03-18: qty 1

## 2015-03-18 NOTE — Progress Notes (Signed)
Pt was admitted in to the unit per stretcher accompanied by a nurse, pt was already asleep due to the ativan given at the ED, he woke up few hours of admission started calling his mama and wanted to get out of bed PA on call was paged ordered another dose of ativan, pt is calm in bed sleeping, admission HX unable to obtain, no pt family at bed side to obtain HX, Pt education not done since he is not at his rigtfull mind, treatment started will continue to monitor

## 2015-03-18 NOTE — Progress Notes (Addendum)
[facility] PROGRESS NOTE  Terrance Adams ZOX:[id number] DOB: 1921-06-04 DOA: 03/17/2015 PCP: Julian Hy, MD  Length of stay: 1   Assessment/Plan: Principal Problem:   Altered mental status Active Problems:   Leukocytosis   Normocytic anemia   History of dementia   HPI: Terrance Adams is a 81 y.o. male with a past medical history of dementia, frequent UTIs, anemia who was referred from his nursing home facility for evaluation of altered mental status and fever. Apparently the patient has been having worsening dementia symptoms for the past 3 days, similar to the symptoms that he gets when he becomes uroseptic. No family member available and the patient is unable to provide further history at this time.   Assessment and plan Altered mental status Fever to 101.2 yesterday No UTI, chest x-ray negative, will repeat chest x-ray, speech therapy evaluation to rule out aspiration CT head without any acute intracranial pathology Continue empiric antibiotics Continue gentle hydration. Altered mental status could be secondary to dehydration/elevated lactate Follow blood culture results Influenza PCR negative Complaining of abdominal pain, check lipase and CT abdomen/pelvis  Active Problems:  Leukocytosis Continue IV antibiotic therapy. Follow-up blood cultures. Monitor WBC.   Normocytic anemia Follow-up hematocrit and hemoglobin.   History of dementia Continue supportive care. Continue medical management of sepsis.    DVT prophylaxsis Lovenox  Code Status:      Code Status Orders        Start     Ordered   03/17/15 2115  Do not attempt resuscitation (DNR)   Continuous    Question Answer Comment  In the event of cardiac or respiratory ARREST Do not call a "code blue"   In the event of cardiac or respiratory ARREST Do not perform Intubation, CPR, defibrillation or ACLS   In the event of cardiac or respiratory ARREST Use medication by any route,  position, wound care, and other measures to relive pain and suffering. May use oxygen, suction and manual treatment of airway obstruction as needed for comfort.      03/17/15 2114      Family Communication: Discussed in detail with the patient, all imaging results, lab results explained to the patient   Disposition Plan:  As above      Consultants:  None   Procedures:  None   Antibiotics: Anti-infectives    Start     Dose/Rate Route Frequency Ordered Stop   03/18/15 1800  vancomycin (VANCOCIN) IVPB 750 mg/150 ml premix     750 mg 150 mL/hr over 60 Minutes Intravenous Every 24 hours 03/17/15 1631     03/18/15 0000  piperacillin-tazobactam (ZOSYN) IVPB 3.375 g     3.375 g 12.5 mL/hr over 240 Minutes Intravenous Every 8 hours 03/17/15 1631     03/17/15 1600  piperacillin-tazobactam (ZOSYN) IVPB 3.375 g     3.375 g 100 mL/hr over 30 Minutes Intravenous  Once 03/17/15 1548 03/17/15 1647   03/17/15 1600  vancomycin (VANCOCIN) IVPB 1000 mg/200 mL premix     1,000 mg 200 mL/hr over 60 Minutes Intravenous  Once 03/17/15 1548 03/17/15 1846         HPI/Subjective: Confused last night, but limited by his brother this morning, patient appears to be nontoxic  Objective: Filed Vitals:   03/17/15 2022 03/18/15 0552 03/18/15 1300 03/18/15 1333  BP: 116/79 102/54  109/82  Pulse: 63 63  73  Temp: 98.7 F (37.1 C) 98.7 F (37.1 C)  97.5 F (  36.4 C)  TempSrc: Oral Oral    Resp: Height:    (1.727 m)   Weight:      SpO2: 87% 91%  100%    Intake/Output Summary (Last 24 hours) at 03/18/15 1350 Last data filed at 03/18/15 0347  Gross per 24 hour  Intake   1050 ml  Output      1 ml  Net   1049 ml    Exam:  General: No acute respiratory distress Lungs: Clear to auscultation bilaterally without wheezes or crackles Cardiovascular: Regular rate and rhythm without murmur gallop or rub normal S1 and S2 Abdomen: Nontender, nondistended, soft, bowel sounds  positive, no rebound, no ascites, no appreciable mass Extremities: No significant cyanosis, clubbing, or edema bilateral lower extremities     Data Review   Micro Results Recent Results (from the past 240 hour(s))  Blood Culture (routine x 2)     Status: None (Preliminary result)   Collection Time: 03/17/15  4:02 PM  Result Value Ref Range Status   Specimen Description RIGHT ANTECUBITAL  Final   Special Requests PEDIATRICS 1CC  Final   Culture NO GROWTH < 24 HOURS  Final   Report Status PENDING  Incomplete  Blood Culture (routine x 2)     Status: None (Preliminary result)   Collection Time: 03/17/15  4:05 PM  Result Value Ref Range Status   Specimen Description LEFT ANTECUBITAL  Final   Special Requests BOTTLES DRAWN AEROBIC AND ANAEROBIC 5CC  Final   Culture NO GROWTH < 24 HOURS  Final   Report Status PENDING  Incomplete  Urine culture     Status: None (Preliminary result)   Collection Time: 03/17/15  5:16 PM  Result Value Ref Range Status   Specimen Description URINE, CATHETERIZED  Final   Special Requests NONE  Final   Culture NO GROWTH < 24 HOURS  Final   Report Status PENDING  Incomplete  MRSA PCR Screening     Status: None   Collection Time: 03/17/15 11:03 PM  Result Value Ref Range Status   MRSA by PCR NEGATIVE NEGATIVE Final    Comment:        The GeneXpert MRSA Assay (FDA approved for NASAL specimens only), is one component of a comprehensive MRSA colonization surveillance program. It is not intended to diagnose MRSA infection nor to guide or monitor treatment for MRSA infections.     Radiology Reports Ct Head Wo Contrast  03/01/2015  CLINICAL DATA:  81 year old male with fall. EXAM: CT HEAD WITHOUT CONTRAST TECHNIQUE: Contiguous axial images were obtained from the base of the skull through the vertex without intravenous contrast. COMPARISON:  CT dated 02/07/2015 FINDINGS: The ventricles are dilated and the sulci are prominent compatible with age-related  atrophy. Periventricular and deep white matter hypodensities represent chronic microvascular ischemic changes. There is no intracranial hemorrhage. No mass effect or midline shift identified. A 1.5 cm left maxillary sinus retention cyst or polyp. The visualized paranasal sinuses and mastoid air cells are otherwise well aerated. The calvarium is intact. IMPRESSION: No acute intracranial pathology. Age-related atrophy and chronic microvascular ischemic disease. Electronically Signed   By: Elgie Collard M.D.   On: 03/01/2015 20:26   Dg Chest Port 1 View  03/17/2015  CLINICAL DATA:  81 year old male with sepsis and altered mental status. EXAM: PORTABLE CHEST 1 VIEW COMPARISON:  02/07/2015 and prior radiographs FINDINGS: Cardiomediastinal silhouette is unchanged. Bibasilar scarring is again noted. No definite airspace disease, pleural  effusion or pneumothorax. Elevation of the left hemidiaphragm is again noted. IMPRESSION: No evidence of acute abnormality. Electronically Signed   By: Harmon PierJeffrey  Hu M.D.   On: 03/17/2015 16:58     CBC  Recent Labs Lab 03/17/15 1625 03/17/15 1832 03/18/15 0535  WBC 15.0*  --  10.7*  HGB 11.2* 12.2* 10.1*  HCT 34.7* 36.0* 31.5*  PLT 284  --  269  MCV 93.3  --  93.2  MCH 30.1  --  29.9  MCHC 32.3  --  32.1  RDW 13.9  --  13.9  LYMPHSABS 1.2  --  1.6  MONOABS 1.2*  --  1.4*  EOSABS 0.6  --  0.8*  BASOSABS 0.0  --  0.0    Chemistries   Recent Labs Lab 03/17/15 1625 03/17/15 1832 03/18/15 0535  NA 137 138 139  K 4.2 4.0 4.6  CL 104 104 107  CO2 24  --  26  GLUCOSE 199* 144* 91  BUN 23* 24* 14  CREATININE 1.13 0.90 1.04  CALCIUM 8.9  --  8.3*  MG  --   --  2.1  AST 30  --  24  ALT 26  --  24  ALKPHOS 134*  --  112  BILITOT 0.4  --  0.6   ------------------------------------------------------------------------------------------------------------------ estimated creatinine clearance is 37.4 mL/min (by C-G formula based on Cr of  1.04). ------------------------------------------------------------------------------------------------------------------ No results for input(s): HGBA1C in the last 72 hours. ------------------------------------------------------------------------------------------------------------------ No results for input(s): CHOL, HDL, LDLCALC, TRIG, CHOLHDL, LDLDIRECT in the last 72 hours. ------------------------------------------------------------------------------------------------------------------ No results for input(s): TSH, T4TOTAL, T3FREE, THYROIDAB in the last 72 hours.  Invalid input(s): FREET3 ------------------------------------------------------------------------------------------------------------------ No results for input(s): VITAMINB12, FOLATE, FERRITIN, TIBC, IRON, RETICCTPCT in the last 72 hours.  Coagulation profile No results for input(s): INR, PROTIME in the last 168 hours.  No results for input(s): DDIMER in the last 72 hours.  Cardiac Enzymes No results for input(s): CKMB, TROPONINI, MYOGLOBIN in the last 168 hours.  Invalid input(s): CK ------------------------------------------------------------------------------------------------------------------ Invalid input(s): POCBNP   CBG:  Recent Labs Lab 03/18/15 0753 03/18/15 1201  GLUCAP 175* 99       Studies: Dg Chest Port 1 View  03/17/2015  CLINICAL DATA:  81 year old male with sepsis and altered mental status. EXAM: PORTABLE CHEST 1 VIEW COMPARISON:  02/07/2015 and prior radiographs FINDINGS: Cardiomediastinal silhouette is unchanged. Bibasilar scarring is again noted. No definite airspace disease, pleural effusion or pneumothorax. Elevation of the left hemidiaphragm is again noted. IMPRESSION: No evidence of acute abnormality. Electronically Signed   By: Harmon PierJeffrey  Hu M.D.   On: 03/17/2015 16:58      Lab Results  Component Value Date   HGBA1C 6.4* 12/18/2013   Lab Results  Component Value Date   LDLCALC  107* 04/25/2014   CREATININE 1.04 03/18/2015       Scheduled Meds: . enoxaparin (LOVENOX) injection  40 mg Subcutaneous Q24H  . piperacillin-tazobactam (ZOSYN)  IV  3.375 g Intravenous Q8H  . sodium chloride  3 mL Intravenous Q12H  . vancomycin  750 mg Intravenous Q24H   Continuous Infusions: . sodium chloride 75 mL/hr at 03/17/15 2237    Principal Problem:   Altered mental status Active Problems:   Leukocytosis   Normocytic anemia   History of dementia    Time spent: 45 minutes   [facility] Eye Institute IncBROL,Kely Dohn  [facility] Pager [phone]. If 7PM-7AM, please contact night-coverage at [email or web address], password [email or web address] 03/18/2015, 1:50 PM  LOS: 1 day

## 2015-03-18 NOTE — Progress Notes (Signed)
Utilization Review Completed.Terrance Adams T12/18/2016  

## 2015-03-19 ENCOUNTER — Inpatient Hospital Stay (HOSPITAL_COMMUNITY): Payer: Medicare Other

## 2015-03-19 DIAGNOSIS — R109 Unspecified abdominal pain: Secondary | ICD-10-CM | POA: Insufficient documentation

## 2015-03-19 DIAGNOSIS — R1012 Left upper quadrant pain: Secondary | ICD-10-CM

## 2015-03-19 LAB — URINE CULTURE: CULTURE: NO GROWTH

## 2015-03-19 LAB — CBC WITH DIFFERENTIAL/PLATELET
BASOS ABS: 0.1 10*3/uL (ref 0.0–0.1)
BASOS PCT: 1 %
EOS ABS: 0.8 10*3/uL — AB (ref 0.0–0.7)
EOS PCT: 7 %
HCT: 32.7 % — ABNORMAL LOW (ref 39.0–52.0)
Hemoglobin: 10.9 g/dL — ABNORMAL LOW (ref 13.0–17.0)
LYMPHS ABS: 1.6 10*3/uL (ref 0.7–4.0)
Lymphocytes Relative: 15 %
MCH: 30.4 pg (ref 26.0–34.0)
MCHC: 33.3 g/dL (ref 30.0–36.0)
MCV: 91.3 fL (ref 78.0–100.0)
Monocytes Absolute: 1.3 10*3/uL — ABNORMAL HIGH (ref 0.1–1.0)
Monocytes Relative: 13 %
NEUTROS PCT: 64 %
Neutro Abs: 6.7 10*3/uL (ref 1.7–7.7)
PLATELETS: 285 10*3/uL (ref 150–400)
RBC: 3.58 MIL/uL — AB (ref 4.22–5.81)
RDW: 13.7 % (ref 11.5–15.5)
WBC: 10.5 10*3/uL (ref 4.0–10.5)

## 2015-03-19 LAB — GLUCOSE, CAPILLARY
GLUCOSE-CAPILLARY: 173 mg/dL — AB (ref 65–99)
GLUCOSE-CAPILLARY: 180 mg/dL — AB (ref 65–99)
GLUCOSE-CAPILLARY: 127 mg/dL — AB (ref 65–99)
Glucose-Capillary: 62 mg/dL — ABNORMAL LOW (ref 65–99)
Glucose-Capillary: 135 mg/dL — ABNORMAL HIGH (ref 65–99)

## 2015-03-19 LAB — COMPREHENSIVE METABOLIC PANEL
ALBUMIN: 2.5 g/dL — AB (ref 3.5–5.0)
ALT: 23 U/L (ref 17–63)
AST: 23 U/L (ref 15–41)
Alkaline Phosphatase: 104 U/L (ref 38–126)
Anion gap: 11 (ref 5–15)
BUN: 14 mg/dL (ref 6–20)
CHLORIDE: 104 mmol/L (ref 101–111)
CO2: 21 mmol/L — AB (ref 22–32)
CREATININE: 0.94 mg/dL (ref 0.61–1.24)
Calcium: 8.1 mg/dL — ABNORMAL LOW (ref 8.9–10.3)
GFR calc non Af Amer: >60 mL/min (ref >60)
GLUCOSE: 62 mg/dL — AB (ref 65–99)
Potassium: 3.9 mmol/L (ref 3.5–5.1)
SODIUM: 136 mmol/L (ref 135–145)
Total Bilirubin: 0.9 mg/dL (ref 0.3–1.2)
Total Protein: 5.8 g/dL — ABNORMAL LOW (ref 6.5–8.1)

## 2015-03-19 LAB — HEMOGLOBIN A1C
HEMOGLOBIN A1C: 6.8 % — AB (ref 4.8–5.6)
Mean Plasma Glucose: 148 mg/dL

## 2015-03-19 MED ORDER — RESOURCE THICKENUP CLEAR PO POWD
ORAL | Status: DC | PRN
  Filled 2015-03-19: qty 125

## 2015-03-19 MED ORDER — INSULIN ASPART 100 UNIT/ML ~~LOC~~ SOLN
0.0000 [IU] | Freq: Three times a day (TID) | SUBCUTANEOUS | Status: DC
  Administered 2015-03-19: 1 [IU] via SUBCUTANEOUS
  Administered 2015-03-20: 2 [IU] via SUBCUTANEOUS
  Administered 2015-03-21: 1 [IU] via SUBCUTANEOUS

## 2015-03-19 MED ORDER — DEXTROSE 50 % IV SOLN
1.0000 | Freq: Once | INTRAVENOUS | Status: AC
  Administered 2015-03-19: 50 mL via INTRAVENOUS
  Filled 2015-03-19: qty 50

## 2015-03-19 NOTE — Clinical Documentation Improvement (Signed)
Internal Medicine  Can the diagnosis of altered mental status be further specified? Worsening of dementia and encephalopathy documented. Please document response in next progress note not in BPA drop down box.   If encephalopathy ruled in, please identify type and etiology of: Metabolic, Toxic, Other  Encephalopathy ruled out  Worsening Dementia as documented  Sepsis  Sepsis ruled out  Dehydration as documented  Other  Clinically Undetermined  Document any associated diagnoses/conditions.  AMS  Leukocytosis: WBC was 15 on admission  Lactates: 2.31, 1.73  Fever of 101.2 on admission  Supporting Information:  Zosyn and Vancomycin initiated  Please exercise your independent, professional judgment when responding. A specific answer is not anticipated or expected.  Thank You,  Shellee MiloEileen T Maycee Blasco RN, BSN, CCDS Health Information Management [place] [phone]; Cell: [phone]

## 2015-03-19 NOTE — Progress Notes (Signed)
Hypoglycemic Event  CBG: 62  Treatment: D50 IV  Symptoms: none noted at this time  Follow-up CBG: Time:0858 CBG Result:173  Possible Reasons for Event: unknown  Comments/MD notified: MD Abrol made aware of CBG via text page.     Terrance Adams

## 2015-03-19 NOTE — Progress Notes (Signed)
[facility] PROGRESS NOTE  Terrance Adams ZOX:[id number] DOB: Apr 08, 1921 DOA: 03/17/2015 PCP: Julian Hy, MD  Length of stay: 2   Assessment/Plan: Principal Problem:   Altered mental status Active Problems:   Leukocytosis   Normocytic anemia   History of dementia   HPI: Terrance Adams is a 81 y.o. male with a past medical history of dementia, frequent UTIs, anemia who was referred from his nursing home facility for evaluation of altered mental status and fever. Apparently the patient has been having worsening dementia symptoms for the past 3 days, similar to the symptoms that he gets when he becomes uroseptic. No family member available and the patient is unable to provide further history at this time.   Assessment and plan Altered mental status, likely toxic metabolic encephalopathy Fever to 101.2 yesterday No UTI, chest x-ray negative, will repeat chest x-ray, speech therapy evaluation to rule out aspiration CT head without any acute intracranial pathology Continue empiric antibiotics, for another 24 hours Continue gentle hydration. Altered mental status could be secondary to dehydration/elevated lactate Influenza PCR negative Patient complaining of abdominal pain this is likely referred pain, secondary to unhealed T11/L2 fracture Repeat chest x-ray to rule out pneumonia completely before stopping antibiotics     Leukocytosis Continue IV antibiotic therapy as above. Follow-up blood cultures. White blood cell count improving   Normocytic anemia Follow-up hematocrit and hemoglobin.   History of dementia Continue supportive care. Continue medical management of sepsis. PT/OT eval  Hypoglycemia : continue accuchecks   DVT prophylaxsis Lovenox  Code Status:      Code Status Orders        Start     Ordered   03/17/15 2115  Do not attempt resuscitation (DNR)   Continuous    Question Answer Comment  In the event of cardiac or respiratory  ARREST Do not call a "code blue"   In the event of cardiac or respiratory ARREST Do not perform Intubation, CPR, defibrillation or ACLS   In the event of cardiac or respiratory ARREST Use medication by any route, position, wound care, and other measures to relive pain and suffering. May use oxygen, suction and manual treatment of airway obstruction as needed for comfort.      03/17/15 2114      Family Communication: Discussed in detail with the patient, all imaging results, lab results explained to the patient   Disposition Plan:  As above      Consultants:  None   Procedures:  None   Antibiotics: Anti-infectives    Start     Dose/Rate Route Frequency Ordered Stop   03/18/15 1800  vancomycin (VANCOCIN) IVPB 750 mg/150 ml premix     750 mg 150 mL/hr over 60 Minutes Intravenous Every 24 hours 03/17/15 1631     03/18/15 0000  piperacillin-tazobactam (ZOSYN) IVPB 3.375 g     3.375 g 12.5 mL/hr over 240 Minutes Intravenous Every 8 hours 03/17/15 1631     03/17/15 1600  piperacillin-tazobactam (ZOSYN) IVPB 3.375 g     3.375 g 100 mL/hr over 30 Minutes Intravenous  Once 03/17/15 1548 03/17/15 1647   03/17/15 1600  vancomycin (VANCOCIN) IVPB 1000 mg/200 mL premix     1,000 mg 200 mL/hr over 60 Minutes Intravenous  Once 03/17/15 1548 03/17/15 1846         HPI/Subjective: Afebrile overnight, hypoglycemic this morning  Objective: Filed Vitals:   03/18/15 1333 03/18/15 2127 03/19/15 0516 03/19/15 1345  BP: 109/82  138/86 148/64 143/75  Pulse: 73 60 59 72  Temp: 97.5 F (36.4 C) 98.9 F (37.2 C) 98.8 F (37.1 C) 97.9 F (36.6 C)  TempSrc:  Oral Oral Oral  Resp: 19 16 18 18   Height:      Weight:      SpO2: 100% 99% 99% 96%    Intake/Output Summary (Last 24 hours) at 03/19/15 1541 Last data filed at 03/19/15 1536  Gross per 24 hour  Intake    320 ml  Output   1000 ml  Net   -680 ml    Exam:  General: No acute respiratory distress Lungs: Clear to auscultation  bilaterally without wheezes or crackles Cardiovascular: Regular rate and rhythm without murmur gallop or rub normal S1 and S2 Abdomen: Nontender, nondistended, soft, bowel sounds positive, no rebound, no ascites, no appreciable mass Extremities: No significant cyanosis, clubbing, or edema bilateral lower extremities     Data Review   Micro Results Recent Results (from the past 240 hour(s))  Blood Culture (routine x 2)     Status: None (Preliminary result)   Collection Time: 03/17/15  4:02 PM  Result Value Ref Range Status   Specimen Description BLOOD RIGHT ANTECUBITAL  Final   Special Requests IN PEDIATRIC BOTTLE 1CC  Final   Culture NO GROWTH 2 DAYS  Final   Report Status PENDING  Incomplete  Blood Culture (routine x 2)     Status: None (Preliminary result)   Collection Time: 03/17/15  4:05 PM  Result Value Ref Range Status   Specimen Description BLOOD LEFT ANTECUBITAL  Final   Special Requests BOTTLES DRAWN AEROBIC AND ANAEROBIC 5CC  Final   Culture NO GROWTH 2 DAYS  Final   Report Status PENDING  Incomplete  Urine culture     Status: None   Collection Time: 03/17/15  5:16 PM  Result Value Ref Range Status   Specimen Description URINE, CATHETERIZED  Final   Special Requests NONE  Final   Culture NO GROWTH 2 DAYS  Final   Report Status 03/19/2015 FINAL  Final  MRSA PCR Screening     Status: None   Collection Time: 03/17/15 11:03 PM  Result Value Ref Range Status   MRSA by PCR NEGATIVE NEGATIVE Final    Comment:        The GeneXpert MRSA Assay (FDA approved for NASAL specimens only), is one component of a comprehensive MRSA colonization surveillance program. It is not intended to diagnose MRSA infection nor to guide or monitor treatment for MRSA infections.     Radiology Reports Ct Head Wo Contrast  03/01/2015  CLINICAL DATA:  81 year old male with fall. EXAM: CT HEAD WITHOUT CONTRAST TECHNIQUE: Contiguous axial images were obtained from the base of the skull  through the vertex without intravenous contrast. COMPARISON:  CT dated 02/07/2015 FINDINGS: The ventricles are dilated and the sulci are prominent compatible with age-related atrophy. Periventricular and deep white matter hypodensities represent chronic microvascular ischemic changes. There is no intracranial hemorrhage. No mass effect or midline shift identified. A 1.5 cm left maxillary sinus retention cyst or polyp. The visualized paranasal sinuses and mastoid air cells are otherwise well aerated. The calvarium is intact. IMPRESSION: No acute intracranial pathology. Age-related atrophy and chronic microvascular ischemic disease. Electronically Signed   By: Elgie Collard M.D.   On: 03/01/2015 20:26   Ct Abdomen Pelvis W Contrast  03/19/2015  CLINICAL DATA:  Abdominal pain.  Dementia. EXAM: CT ABDOMEN AND PELVIS WITH CONTRAST TECHNIQUE: Multidetector CT  imaging of the abdomen and pelvis was performed using the standard protocol following bolus administration of intravenous contrast. CONTRAST:  80mL OMNIPAQUE IOHEXOL 300 MG/ML  SOLN COMPARISON:  11/06/2014 FINDINGS: Lower chest and abdominal wall:  Symmetric gynecomastia. Chronic elevation the left diaphragm. New small bilateral pleural effusion with atelectasis dependently. Moderate aortic valve calcifications/sclerosis. Fatty left inguinal hernia. Gas in the right lower quadrant subcutaneous fat around the abdominal wall musculature. Although gas has a roughly linear morphology, appearance not typical of intravenous gas. There is no surrounding cellulitic change. Hepatobiliary: No focal liver abnormality.No evidence of biliary obstruction or stone. Pancreas: Unremarkable. Spleen: Unremarkable. Adrenals/Urinary Tract: Negative adrenals. Renal cortical thinning. No hydronephrosis. Chronic circumferential bladder wall thickening, likely from outlet obstruction. Reproductive:Negative for age. Stomach/Bowel: Moderate rectal stool volume without wall thickening.  No bowel obstruction. Extensive distal colonic diverticulosis. No appendicitis. Vascular/Lymphatic: No acute vascular abnormality. No mass or adenopathy. Peritoneal: Small volume ascites, likely volume overload given the pleural fluid. Musculoskeletal: L2 compression fracture with horizontal sclerotic fracture lines that is new from previous. There is no associated height loss. New T11 compression fracture with height loss greater than 75%. The fracture plane is still readily evident, but there is surrounding sclerosis suggesting an element of chronicity. Mild retropulsion without significant canal stenosis. Remote T8 compression fracture with advanced height loss. IMPRESSION: 1. No acute intra-abdominal finding. 2. Small pleural effusions with dependent atelectasis. 3. Right abdominal wall gas is likely from medication injection, but deeper than typically seen. Correlate with skin exam. 4. Non acute but unhealed T11 compression fracture with advanced height loss. 5. Healing L2 body fracture without height loss. Electronically Signed   By: Marnee Spring M.D.   On: 03/19/2015 02:50   Dg Chest Port 1 View  03/17/2015  CLINICAL DATA:  81 year old male with sepsis and altered mental status. EXAM: PORTABLE CHEST 1 VIEW COMPARISON:  02/07/2015 and prior radiographs FINDINGS: Cardiomediastinal silhouette is unchanged. Bibasilar scarring is again noted. No definite airspace disease, pleural effusion or pneumothorax. Elevation of the left hemidiaphragm is again noted. IMPRESSION: No evidence of acute abnormality. Electronically Signed   By: Harmon Pier M.D.   On: 03/17/2015 16:58     CBC  Recent Labs Lab 03/17/15 1625 03/17/15 1832 03/18/15 0535 03/19/15 0616  WBC 15.0*  --  10.7* 10.5  HGB 11.2* 12.2* 10.1* 10.9*  HCT 34.7* 36.0* 31.5* 32.7*  PLT 284  --  269 285  MCV 93.3  --  93.2 91.3  MCH 30.1  --  29.9 30.4  MCHC 32.3  --  32.1 33.3  RDW 13.9  --  13.9 13.7  LYMPHSABS 1.2  --  1.6 1.6  MONOABS  1.2*  --  1.4* 1.3*  EOSABS 0.6  --  0.8* 0.8*  BASOSABS 0.0  --  0.0 0.1    Chemistries   Recent Labs Lab 03/17/15 1625 03/17/15 1832 03/18/15 0535 03/19/15 0616  NA 137 138 139 136  K 4.2 4.0 4.6 3.9  CL 104 104 107 104  CO2 24  --  26 21*  GLUCOSE 199* 144* 91 62*  BUN 23* 24* 14 14  CREATININE 1.13 0.90 1.04 0.94  CALCIUM 8.9  --  8.3* 8.1*  MG  --   --  2.1  --   AST 30  --  24 23  ALT 26  --  24 23  ALKPHOS 134*  --  112 104  BILITOT 0.4  --  0.6 0.9   ------------------------------------------------------------------------------------------------------------------ estimated creatinine clearance  is 41.4 mL/min (by C-G formula based on Cr of 0.94). ------------------------------------------------------------------------------------------------------------------  Recent Labs  03/18/15 0535  HGBA1C 6.8*   ------------------------------------------------------------------------------------------------------------------ No results for input(s): CHOL, HDL, LDLCALC, TRIG, CHOLHDL, LDLDIRECT in the last 72 hours. ------------------------------------------------------------------------------------------------------------------  Recent Labs  03/18/15 1454  TSH 0.590   ------------------------------------------------------------------------------------------------------------------  Recent Labs  03/18/15 1454  VITAMINB12 1006*    Coagulation profile No results for input(s): INR, PROTIME in the last 168 hours.  No results for input(s): DDIMER in the last 72 hours.  Cardiac Enzymes No results for input(s): CKMB, TROPONINI, MYOGLOBIN in the last 168 hours.  Invalid input(s): CK ------------------------------------------------------------------------------------------------------------------ Invalid input(s): POCBNP   CBG:  Recent Labs Lab 03/18/15 1201 03/18/15 1702 03/19/15 0811 03/19/15 0857 03/19/15 1201  GLUCAP 99 91 62* 173* 180*        Studies: Ct Abdomen Pelvis W Contrast  03/19/2015  CLINICAL DATA:  Abdominal pain.  Dementia. EXAM: CT ABDOMEN AND PELVIS WITH CONTRAST TECHNIQUE: Multidetector CT imaging of the abdomen and pelvis was performed using the standard protocol following bolus administration of intravenous contrast. CONTRAST:  80mL OMNIPAQUE IOHEXOL 300 MG/ML  SOLN COMPARISON:  11/06/2014 FINDINGS: Lower chest and abdominal wall:  Symmetric gynecomastia. Chronic elevation the left diaphragm. New small bilateral pleural effusion with atelectasis dependently. Moderate aortic valve calcifications/sclerosis. Fatty left inguinal hernia. Gas in the right lower quadrant subcutaneous fat around the abdominal wall musculature. Although gas has a roughly linear morphology, appearance not typical of intravenous gas. There is no surrounding cellulitic change. Hepatobiliary: No focal liver abnormality.No evidence of biliary obstruction or stone. Pancreas: Unremarkable. Spleen: Unremarkable. Adrenals/Urinary Tract: Negative adrenals. Renal cortical thinning. No hydronephrosis. Chronic circumferential bladder wall thickening, likely from outlet obstruction. Reproductive:Negative for age. Stomach/Bowel: Moderate rectal stool volume without wall thickening. No bowel obstruction. Extensive distal colonic diverticulosis. No appendicitis. Vascular/Lymphatic: No acute vascular abnormality. No mass or adenopathy. Peritoneal: Small volume ascites, likely volume overload given the pleural fluid. Musculoskeletal: L2 compression fracture with horizontal sclerotic fracture lines that is new from previous. There is no associated height loss. New T11 compression fracture with height loss greater than 75%. The fracture plane is still readily evident, but there is surrounding sclerosis suggesting an element of chronicity. Mild retropulsion without significant canal stenosis. Remote T8 compression fracture with advanced height loss. IMPRESSION: 1. No  acute intra-abdominal finding. 2. Small pleural effusions with dependent atelectasis. 3. Right abdominal wall gas is likely from medication injection, but deeper than typically seen. Correlate with skin exam. 4. Non acute but unhealed T11 compression fracture with advanced height loss. 5. Healing L2 body fracture without height loss. Electronically Signed   By: Marnee Spring M.D.   On: 03/19/2015 02:50   Dg Chest Port 1 View  03/17/2015  CLINICAL DATA:  81 year old male with sepsis and altered mental status. EXAM: PORTABLE CHEST 1 VIEW COMPARISON:  02/07/2015 and prior radiographs FINDINGS: Cardiomediastinal silhouette is unchanged. Bibasilar scarring is again noted. No definite airspace disease, pleural effusion or pneumothorax. Elevation of the left hemidiaphragm is again noted. IMPRESSION: No evidence of acute abnormality. Electronically Signed   By: Harmon Pier M.D.   On: 03/17/2015 16:58      Lab Results  Component Value Date   HGBA1C 6.8* 03/18/2015   HGBA1C 6.4* 12/18/2013   Lab Results  Component Value Date   LDLCALC 107* 04/25/2014   CREATININE 0.94 03/19/2015       Scheduled Meds: . enoxaparin (LOVENOX) injection  40 mg Subcutaneous Q24H  . piperacillin-tazobactam (ZOSYN)  IV  3.375 g Intravenous Q8H  . sodium chloride  3 mL Intravenous Q12H  . vancomycin  750 mg Intravenous Q24H   Continuous Infusions: . sodium chloride 100 mL/hr at 03/18/15 2023    Principal Problem:   Altered mental status Active Problems:   Leukocytosis   Normocytic anemia   History of dementia    Time spent: 45 minutes   [employer] [facility] Inc LLCBROL,Nadeen Shipman  [facility] Pager [phone]. If 7PM-7AM, please contact night-coverage at [email or web address], password [email or web address] 03/19/2015, 3:41 PM  LOS: 2 days

## 2015-03-19 NOTE — Progress Notes (Signed)
Pharmacy Antibiotic Follow-up Note  Suezanne JacquetCharles E Fuerst is a 81 y.o. year-old male admitted on 03/17/2015.    The patient is currently on Day 3 of  Vancomycin and Zosyn for Pneumonia  Blood cultures negative to date WBC improved, afebrile  Assessment/Plan: Please consider stopping broad spectrum antibiotics? Consider po regimen if needed     Temp (24hrs), Avg:98.4 F (36.9 C), Min:97.5 F (36.4 C), Max:98.9 F (37.2 C)   Recent Labs Lab 03/17/15 1625 03/18/15 0535 03/19/15 0616  WBC 15.0* 10.7* 10.5    Recent Labs Lab 03/17/15 1625 03/17/15 1832 03/18/15 0535 03/19/15 0616  CREATININE 1.13 0.90 1.04 0.94   Estimated Creatinine Clearance: 41.4 mL/min (by C-G formula based on Cr of 0.94).    No Known Allergies   Thank you Okey RegalLisa Torie Priebe, PharmD [phone] 03/19/2015 10:51 AM

## 2015-03-19 NOTE — Care Management Note (Signed)
Case Management Note  Patient Details  Name: Terrance Adams MRN: [id number] Date of Birth: 1921-05-22  Subjective/Objective:                  Patient received from SNF for fever and UTI. IV Abx   Action/Plan:  CSW to facilitate DC to SNF when medically stable.  Expected Discharge Date:                  Expected Discharge Plan:  Skilled Nursing Facility  In-House Referral:  Clinical Social Work  Discharge planning Services     Post Acute Care Choice:    Choice offered to:     DME Arranged:    DME Agency:     HH Arranged:    HH Agency:     Status of Service:  In process, will continue to follow  Medicare Important Message Given:    Date Medicare IM Given:    Medicare IM give by:    Date Additional Medicare IM Given:    Additional Medicare Important Message give by:     If discussed at Long Length of Stay Meetings, dates discussed:    Additional Comments:  [name], RN 03/19/2015, 4:00 PM

## 2015-03-19 NOTE — Evaluation (Signed)
Clinical/Bedside Swallow Evaluation Patient Details  Name: Terrance JacquetCharles E Adams MRN: [id number] Date of Birth: 03-16-1922  Today's Date: 03/19/2015 Time: SLP Start Time (ACUTE ONLY): 0949 SLP Stop Time (ACUTE ONLY): 1002 SLP Time Calculation (min) (ACUTE ONLY): 13 min  Past Medical History:  Past Medical History  Diagnosis Date  . UTI (urinary tract infection)     HX OF UTI  . Dyspnea   . Altered mental status    Past Surgical History:  Past Surgical History  Procedure Laterality Date  . Hernia repair     HPI:  81 y.o. male with a past medical history of dementia, frequent UTIs, anemia admitted for evaluation of altered mental status and fever. MD work up does NOT reveal a UTI. CXR 12/17 no evidence of acute abnormality. BSE 12/18/13 recommended Dys 1, nectar liquids.   Assessment / Plan / Recommendation Clinical Impression  Pt has history of chronic dysphagia with modified diet recommended 03/2014. Suspect penetration versus aspiration with thin liquids due to delayed cough at end of evaluation.  Cognitive impairments increase risk for dysphagia. Minimal belching may indicate esophageal componenet. Dys 3, nectar thick liquids, pills whole in applesauce, full supervision and assist and continued ST.     Aspiration Risk  Moderate aspiration risk    Diet Recommendation Dysphagia 3 (Mech soft);Nectar-thick liquid   Liquid Administration via: Cup;No straw Medication Administration: Whole meds with puree Supervision: Staff to assist with self feeding;Full supervision/cueing for compensatory strategies Compensations: Slow rate;Small sips/bites;Minimize environmental distractions Postural Changes: Seated upright at 90 degrees    Other  Recommendations Oral Care Recommendations: Oral care BID Other Recommendations: Order thickener from pharmacy   Follow up Recommendations   (tba)    Frequency and Duration min 2x/week  2 weeks       Prognosis Prognosis for Safe Diet Advancement:  Fair Barriers to Reach Goals: Cognitive deficits      Swallow Study   General HPI: 81 y.o. male with a past medical history of dementia, frequent UTIs, anemia admitted for evaluation of altered mental status and fever. MD work up does NOT reveal a UTI. CXR 12/17 no evidence of acute abnormality. BSE 12/18/13 recommended Dys 1, nectar liquids. Type of Study: Bedside Swallow Evaluation Previous Swallow Assessment:  (see HPI) Diet Prior to this Study: Regular;Thin liquids Temperature Spikes Noted: No Respiratory Status: Room air History of Recent Intubation: No Behavior/Cognition: Alert;Cooperative;Pleasant mood;Confused;Requires cueing Oral Cavity Assessment: Within Functional Limits Oral Care Completed by SLP: No Oral Cavity - Dentition: Dentures, top Vision:  (questionable impaired) Self-Feeding Abilities: Needs assist;Needs set up Patient Positioning: Upright in bed Baseline Vocal Quality: Normal Volitional Cough: Weak Volitional Swallow: Able to elicit    Oral/Motor/Sensory Function Overall Oral Motor/Sensory Function: Generalized oral weakness   Ice Chips Ice chips: Not tested   Thin Liquid Thin Liquid: Impaired Presentation: Cup Pharyngeal  Phase Impairments: Suspected delayed Swallow    Nectar Thick Nectar Thick Liquid: Not tested   Honey Thick Honey Thick Liquid: Not tested   Puree Puree: Impaired Pharyngeal Phase Impairments: Suspected delayed Swallow   Solid Solid: Not tested       Terrance Adams, Terrance Adams 03/19/2015,10:21 AM  Terrance Adams Terrance Adams M.Ed [employer] Pager [phone]

## 2015-03-20 LAB — GLUCOSE, CAPILLARY
GLUCOSE-CAPILLARY: 90 mg/dL (ref 65–99)
GLUCOSE-CAPILLARY: 117 mg/dL — AB (ref 65–99)
GLUCOSE-CAPILLARY: 162 mg/dL — AB (ref 65–99)
Glucose-Capillary: 132 mg/dL — ABNORMAL HIGH (ref 65–99)

## 2015-03-20 LAB — COMPREHENSIVE METABOLIC PANEL
ALK PHOS: 95 U/L (ref 38–126)
ALT: 19 U/L (ref 17–63)
ANION GAP: 9 (ref 5–15)
AST: 20 U/L (ref 15–41)
Albumin: 2.5 g/dL — ABNORMAL LOW (ref 3.5–5.0)
BUN: 12 mg/dL (ref 6–20)
CALCIUM: 8.4 mg/dL — AB (ref 8.9–10.3)
CHLORIDE: 110 mmol/L (ref 101–111)
CO2: 20 mmol/L — AB (ref 22–32)
Creatinine, Ser: 1.01 mg/dL (ref 0.61–1.24)
Glucose, Bld: 98 mg/dL (ref 65–99)
Potassium: 4.4 mmol/L (ref 3.5–5.1)
SODIUM: 139 mmol/L (ref 135–145)
Total Bilirubin: 0.6 mg/dL (ref 0.3–1.2)
Total Protein: 5.5 g/dL — ABNORMAL LOW (ref 6.5–8.1)

## 2015-03-20 LAB — CBC WITH DIFFERENTIAL/PLATELET
Basophils Absolute: 0.1 10*3/uL (ref 0.0–0.1)
Basophils Relative: 1 %
EOS ABS: 1 10*3/uL — AB (ref 0.0–0.7)
EOS PCT: 9 %
HCT: 33.6 % — ABNORMAL LOW (ref 39.0–52.0)
Hemoglobin: 11.2 g/dL — ABNORMAL LOW (ref 13.0–17.0)
LYMPHS ABS: 2.1 10*3/uL (ref 0.7–4.0)
LYMPHS PCT: 19 %
MCH: 30.7 pg (ref 26.0–34.0)
MCHC: 33.3 g/dL (ref 30.0–36.0)
MCV: 92.1 fL (ref 78.0–100.0)
MONOS PCT: 11 %
Monocytes Absolute: 1.2 10*3/uL — ABNORMAL HIGH (ref 0.1–1.0)
Neutro Abs: 6.4 10*3/uL (ref 1.7–7.7)
Neutrophils Relative %: 60 %
PLATELETS: 286 10*3/uL (ref 150–400)
RBC: 3.65 MIL/uL — AB (ref 4.22–5.81)
RDW: 13.7 % (ref 11.5–15.5)
WBC: 10.7 10*3/uL — AB (ref 4.0–10.5)

## 2015-03-20 LAB — HEMOGLOBIN A1C
Hgb A1c MFr Bld: 6.6 % — ABNORMAL HIGH (ref 4.8–5.6)
Mean Plasma Glucose: 143 mg/dL

## 2015-03-20 NOTE — Progress Notes (Signed)
[facility] PROGRESS NOTE  FINIAN HELVEY ZOX:[id number] DOB: 1921/06/23 DOA: 03/17/2015 PCP: Julian Hy, MD  Length of stay: 3   Assessment/Plan: Principal Problem:   Altered mental status Active Problems:   Leukocytosis   Normocytic anemia   History of dementia   Abdominal pain   HPI: Terrance Adams is a 81 y.o. male with a past medical history of dementia, frequent UTIs, anemia who was referred from his nursing home facility for evaluation of altered mental status and fever. Apparently the patient has been having worsening dementia symptoms for the past 3 days, similar to the symptoms that he gets when he becomes uroseptic. No family member available and the patient is unable to provide further history at this time.   Assessment and plan Altered mental status, likely toxic metabolic encephalopathy Afebrile last 24 hours, No UTI, chest x-ray negative, will repeat chest x-ray, speech therapy evaluation to rule out aspiration CT head without any acute intracranial pathology Continue empiric antibiotics, for another 24 hours Continue gentle hydration. Altered mental status could be secondary to dehydration/elevated lactate Influenza PCR negative Patient complaining of abdominal pain this is likely referred pain, secondary to unhealed T11/L2 fracture Repeat chest x-ray to rule out pneumonia, chest x-ray negative    Leukocytosis Repeat chest x-ray negative, no source of infection, DC antibiotics Urine culture, blood culture negative improving, cannot rule out a viral URI    Normocytic anemia Follow-up hematocrit and hemoglobin.   History of dementia Continue supportive care. Continue medical management of sepsis. PT/OT eval  Hypoglycemia : continue accuchecks   DVT prophylaxsis Lovenox  Code Status:      Code Status Orders        Start     Ordered   03/17/15 2115  Do not attempt resuscitation (DNR)   Continuous    Question Answer Comment   In the event of cardiac or respiratory ARREST Do not call a "code blue"   In the event of cardiac or respiratory ARREST Do not perform Intubation, CPR, defibrillation or ACLS   In the event of cardiac or respiratory ARREST Use medication by any route, position, wound care, and other measures to relive pain and suffering. May use oxygen, suction and manual treatment of airway obstruction as needed for comfort.      03/17/15 2114      Family Communication: Discussed in detail with the patient, all imaging results, lab results explained to the patient   Disposition Plan:  Palliative care consultation for goals of care    Consultants:  None   Procedures:  None   Antibiotics: Anti-infectives    Start     Dose/Rate Route Frequency Ordered Stop   03/18/15 1800  vancomycin (VANCOCIN) IVPB 750 mg/150 ml premix     750 mg 150 mL/hr over 60 Minutes Intravenous Every 24 hours 03/17/15 1631     03/18/15 0000  piperacillin-tazobactam (ZOSYN) IVPB 3.375 g     3.375 g 12.5 mL/hr over 240 Minutes Intravenous Every 8 hours 03/17/15 1631     03/17/15 1600  piperacillin-tazobactam (ZOSYN) IVPB 3.375 g     3.375 g 100 mL/hr over 30 Minutes Intravenous  Once 03/17/15 1548 03/17/15 1647   03/17/15 1600  vancomycin (VANCOCIN) IVPB 1000 mg/200 mL premix     1,000 mg 200 mL/hr over 60 Minutes Intravenous  Once 03/17/15 1548 03/17/15 1846         HPI/Subjective: Appears to be in restraints, confused, afebrile overnight  Objective: Filed Vitals:   03/19/15 1345 03/19/15 2159 03/20/15 0032 03/20/15 0509  BP: 143/75 168/86 149/74 145/83  Pulse: 72 71 67 62  Temp: 97.9 F (36.6 C) 98.4 F (36.9 C)  98.6 F (37 C)  TempSrc: Oral Oral  Oral  Resp: Height:      Weight:      SpO2: 96% 95%  100%    Intake/Output Summary (Last 24 hours) at 03/20/15 1409 Last data filed at 03/20/15 4782  Gross per 24 hour  Intake   1490 ml  Output   1600 ml  Net   -110 ml     Exam:  General: No acute respiratory distress Lungs: Clear to auscultation bilaterally without wheezes or crackles Cardiovascular: Regular rate and rhythm without murmur gallop or rub normal S1 and S2 Abdomen: Nontender, nondistended, soft, bowel sounds positive, no rebound, no ascites, no appreciable mass Extremities: No significant cyanosis, clubbing, or edema bilateral lower extremities     Data Review   Micro Results Recent Results (from the past 240 hour(s))  Blood Culture (routine x 2)     Status: None (Preliminary result)   Collection Time: 03/17/15  4:02 PM  Result Value Ref Range Status   Specimen Description BLOOD RIGHT ANTECUBITAL  Final   Special Requests IN PEDIATRIC BOTTLE 1CC  Final   Culture NO GROWTH 3 DAYS  Final   Report Status PENDING  Incomplete  Blood Culture (routine x 2)     Status: None (Preliminary result)   Collection Time: 03/17/15  4:05 PM  Result Value Ref Range Status   Specimen Description BLOOD LEFT ANTECUBITAL  Final   Special Requests BOTTLES DRAWN AEROBIC AND ANAEROBIC 5CC  Final   Culture NO GROWTH 3 DAYS  Final   Report Status PENDING  Incomplete  Urine culture     Status: None   Collection Time: 03/17/15  5:16 PM  Result Value Ref Range Status   Specimen Description URINE, CATHETERIZED  Final   Special Requests NONE  Final   Culture NO GROWTH 2 DAYS  Final   Report Status 03/19/2015 FINAL  Final  MRSA PCR Screening     Status: None   Collection Time: 03/17/15 11:03 PM  Result Value Ref Range Status   MRSA by PCR NEGATIVE NEGATIVE Final    Comment:        The GeneXpert MRSA Assay (FDA approved for NASAL specimens only), is one component of a comprehensive MRSA colonization surveillance program. It is not intended to diagnose MRSA infection nor to guide or monitor treatment for MRSA infections.     Radiology Reports Dg Chest 2 View  03/19/2015  CLINICAL DATA:  81 year old with current history of dementia and anemia who  has fever and leukocytosis and acute mental status changes similar to that with prior episodes of urosepsis. EXAM: CHEST  2 VIEW COMPARISON:  03/17/2015 and earlier. FINDINGS: Cardiac silhouette moderately to markedly enlarged, unchanged. Thoracic aorta atherosclerotic, unchanged. Chronic elevation of the left hemidiaphragm and chronic scar/atelectasis in the left lower lobe and lingula. Lungs otherwise clear. No localized airspace consolidation. No pleural effusions. No pneumothorax. Normal pulmonary vascularity. Multiple thoracic spine compression fractures as noted on prior examinations with generalized osseous demineralization. IMPRESSION: No acute cardiopulmonary disease. Stable cardiomegaly. Stable chronic scar/atelectasis in the left lower lobe and lingular related to elevation of the left hemidiaphragm. Electronically Signed   By: Hulan Saas M.D.   On: 03/19/2015 18:54   Ct Head Wo  Contrast  03/01/2015  CLINICAL DATA:  81 year old male with fall. EXAM: CT HEAD WITHOUT CONTRAST TECHNIQUE: Contiguous axial images were obtained from the base of the skull through the vertex without intravenous contrast. COMPARISON:  CT dated 02/07/2015 FINDINGS: The ventricles are dilated and the sulci are prominent compatible with age-related atrophy. Periventricular and deep white matter hypodensities represent chronic microvascular ischemic changes. There is no intracranial hemorrhage. No mass effect or midline shift identified. A 1.5 cm left maxillary sinus retention cyst or polyp. The visualized paranasal sinuses and mastoid air cells are otherwise well aerated. The calvarium is intact. IMPRESSION: No acute intracranial pathology. Age-related atrophy and chronic microvascular ischemic disease. Electronically Signed   By: Elgie Collard M.D.   On: 03/01/2015 20:26   Ct Abdomen Pelvis W Contrast  03/19/2015  CLINICAL DATA:  Abdominal pain.  Dementia. EXAM: CT ABDOMEN AND PELVIS WITH CONTRAST TECHNIQUE:  Multidetector CT imaging of the abdomen and pelvis was performed using the standard protocol following bolus administration of intravenous contrast. CONTRAST:  80mL OMNIPAQUE IOHEXOL 300 MG/ML  SOLN COMPARISON:  11/06/2014 FINDINGS: Lower chest and abdominal wall:  Symmetric gynecomastia. Chronic elevation the left diaphragm. New small bilateral pleural effusion with atelectasis dependently. Moderate aortic valve calcifications/sclerosis. Fatty left inguinal hernia. Gas in the right lower quadrant subcutaneous fat around the abdominal wall musculature. Although gas has a roughly linear morphology, appearance not typical of intravenous gas. There is no surrounding cellulitic change. Hepatobiliary: No focal liver abnormality.No evidence of biliary obstruction or stone. Pancreas: Unremarkable. Spleen: Unremarkable. Adrenals/Urinary Tract: Negative adrenals. Renal cortical thinning. No hydronephrosis. Chronic circumferential bladder wall thickening, likely from outlet obstruction. Reproductive:Negative for age. Stomach/Bowel: Moderate rectal stool volume without wall thickening. No bowel obstruction. Extensive distal colonic diverticulosis. No appendicitis. Vascular/Lymphatic: No acute vascular abnormality. No mass or adenopathy. Peritoneal: Small volume ascites, likely volume overload given the pleural fluid. Musculoskeletal: L2 compression fracture with horizontal sclerotic fracture lines that is new from previous. There is no associated height loss. New T11 compression fracture with height loss greater than 75%. The fracture plane is still readily evident, but there is surrounding sclerosis suggesting an element of chronicity. Mild retropulsion without significant canal stenosis. Remote T8 compression fracture with advanced height loss. IMPRESSION: 1. No acute intra-abdominal finding. 2. Small pleural effusions with dependent atelectasis. 3. Right abdominal wall gas is likely from medication injection, but deeper  than typically seen. Correlate with skin exam. 4. Non acute but unhealed T11 compression fracture with advanced height loss. 5. Healing L2 body fracture without height loss. Electronically Signed   By: Marnee Spring M.D.   On: 03/19/2015 02:50   Dg Chest Port 1 View  03/17/2015  CLINICAL DATA:  81 year old male with sepsis and altered mental status. EXAM: PORTABLE CHEST 1 VIEW COMPARISON:  02/07/2015 and prior radiographs FINDINGS: Cardiomediastinal silhouette is unchanged. Bibasilar scarring is again noted. No definite airspace disease, pleural effusion or pneumothorax. Elevation of the left hemidiaphragm is again noted. IMPRESSION: No evidence of acute abnormality. Electronically Signed   By: Harmon Pier M.D.   On: 03/17/2015 16:58     CBC  Recent Labs Lab 03/17/15 1625 03/17/15 1832 03/18/15 0535 03/19/15 0616 03/20/15 0656  WBC 15.0*  --  10.7* 10.5 10.7*  HGB 11.2* 12.2* 10.1* 10.9* 11.2*  HCT 34.7* 36.0* 31.5* 32.7* 33.6*  PLT 284  --  269 285 286  MCV 93.3  --  93.2 91.3 92.1  MCH 30.1  --  29.9 30.4 30.7  MCHC 32.3  --  32.1 33.3 33.3  RDW 13.9  --  13.9 13.7 13.7  LYMPHSABS 1.2  --  1.6 1.6 2.1  MONOABS 1.2*  --  1.4* 1.3* 1.2*  EOSABS 0.6  --  0.8* 0.8* 1.0*  BASOSABS 0.0  --  0.0 0.1 0.1    Chemistries   Recent Labs Lab 03/17/15 1625 03/17/15 1832 03/18/15 0535 03/19/15 0616 03/20/15 0656  NA 137 138 139 136 139  K 4.2 4.0 4.6 3.9 4.4  CL 104 104 107 104 110  CO2 24  --  26 21* 20*  GLUCOSE 199* 144* 91 62* 98  BUN 23* 24* 14 14 12   CREATININE 1.13 0.90 1.04 0.94 1.01  CALCIUM 8.9  --  8.3* 8.1* 8.4*  MG  --   --  2.1  --   --   AST 30  --  24 23 20   ALT 26  --  24 23 19   ALKPHOS 134*  --  112 104 95  BILITOT 0.4  --  0.6 0.9 0.6   ------------------------------------------------------------------------------------------------------------------ estimated creatinine clearance is 38.5 mL/min (by C-G formula based on Cr of  1.01). ------------------------------------------------------------------------------------------------------------------  Recent Labs  03/18/15 0535 03/19/15 0616  HGBA1C 6.8* 6.6*   ------------------------------------------------------------------------------------------------------------------ No results for input(s): CHOL, HDL, LDLCALC, TRIG, CHOLHDL, LDLDIRECT in the last 72 hours. ------------------------------------------------------------------------------------------------------------------  Recent Labs  03/18/15 1454  TSH 0.590   ------------------------------------------------------------------------------------------------------------------  Recent Labs  03/18/15 1454  VITAMINB12 1006*    Coagulation profile No results for input(s): INR, PROTIME in the last 168 hours.  No results for input(s): DDIMER in the last 72 hours.  Cardiac Enzymes No results for input(s): CKMB, TROPONINI, MYOGLOBIN in the last 168 hours.  Invalid input(s): CK ------------------------------------------------------------------------------------------------------------------ Invalid input(s): POCBNP   CBG:  Recent Labs Lab 03/19/15 1201 03/19/15 1605 03/19/15 2158 03/20/15 0825 03/20/15 1155  GLUCAP 180* 135* 127* 90 117*       Studies: Dg Chest 2 View  03/19/2015  CLINICAL DATA:  81 year old with current history of dementia and anemia who has fever and leukocytosis and acute mental status changes similar to that with prior episodes of urosepsis. EXAM: CHEST  2 VIEW COMPARISON:  03/17/2015 and earlier. FINDINGS: Cardiac silhouette moderately to markedly enlarged, unchanged. Thoracic aorta atherosclerotic, unchanged. Chronic elevation of the left hemidiaphragm and chronic scar/atelectasis in the left lower lobe and lingula. Lungs otherwise clear. No localized airspace consolidation. No pleural effusions. No pneumothorax. Normal pulmonary vascularity. Multiple thoracic spine  compression fractures as noted on prior examinations with generalized osseous demineralization. IMPRESSION: No acute cardiopulmonary disease. Stable cardiomegaly. Stable chronic scar/atelectasis in the left lower lobe and lingular related to elevation of the left hemidiaphragm. Electronically Signed   By: Hulan Saashomas  Lawrence M.D.   On: 03/19/2015 18:54   Ct Abdomen Pelvis W Contrast  03/19/2015  CLINICAL DATA:  Abdominal pain.  Dementia. EXAM: CT ABDOMEN AND PELVIS WITH CONTRAST TECHNIQUE: Multidetector CT imaging of the abdomen and pelvis was performed using the standard protocol following bolus administration of intravenous contrast. CONTRAST:  80mL OMNIPAQUE IOHEXOL 300 MG/ML  SOLN COMPARISON:  11/06/2014 FINDINGS: Lower chest and abdominal wall:  Symmetric gynecomastia. Chronic elevation the left diaphragm. New small bilateral pleural effusion with atelectasis dependently. Moderate aortic valve calcifications/sclerosis. Fatty left inguinal hernia. Gas in the right lower quadrant subcutaneous fat around the abdominal wall musculature. Although gas has a roughly linear morphology, appearance not typical of intravenous gas. There is no surrounding cellulitic change. Hepatobiliary: No focal liver abnormality.No evidence of biliary obstruction  or stone. Pancreas: Unremarkable. Spleen: Unremarkable. Adrenals/Urinary Tract: Negative adrenals. Renal cortical thinning. No hydronephrosis. Chronic circumferential bladder wall thickening, likely from outlet obstruction. Reproductive:Negative for age. Stomach/Bowel: Moderate rectal stool volume without wall thickening. No bowel obstruction. Extensive distal colonic diverticulosis. No appendicitis. Vascular/Lymphatic: No acute vascular abnormality. No mass or adenopathy. Peritoneal: Small volume ascites, likely volume overload given the pleural fluid. Musculoskeletal: L2 compression fracture with horizontal sclerotic fracture lines that is new from previous. There is no  associated height loss. New T11 compression fracture with height loss greater than 75%. The fracture plane is still readily evident, but there is surrounding sclerosis suggesting an element of chronicity. Mild retropulsion without significant canal stenosis. Remote T8 compression fracture with advanced height loss. IMPRESSION: 1. No acute intra-abdominal finding. 2. Small pleural effusions with dependent atelectasis. 3. Right abdominal wall gas is likely from medication injection, but deeper than typically seen. Correlate with skin exam. 4. Non acute but unhealed T11 compression fracture with advanced height loss. 5. Healing L2 body fracture without height loss. Electronically Signed   By: Marnee Spring M.D.   On: 03/19/2015 02:50      Lab Results  Component Value Date   HGBA1C 6.6* 03/19/2015   HGBA1C 6.8* 03/18/2015   HGBA1C 6.4* 12/18/2013   Lab Results  Component Value Date   LDLCALC 107* 04/25/2014   CREATININE 1.01 03/20/2015       Scheduled Meds: . enoxaparin (LOVENOX) injection  40 mg Subcutaneous Q24H  . insulin aspart  0-9 Units Subcutaneous TID WC  . piperacillin-tazobactam (ZOSYN)  IV  3.375 g Intravenous Q8H  . sodium chloride  3 mL Intravenous Q12H  . vancomycin  750 mg Intravenous Q24H   Continuous Infusions: . sodium chloride 100 mL/hr at 03/20/15 0403    Principal Problem:   Altered mental status Active Problems:   Leukocytosis   Normocytic anemia   History of dementia   Abdominal pain    Time spent: 45 minutes   [facility]  [facility] Pager [phone]. If 7PM-7AM, please contact night-coverage at [email or web address], password [facility] 03/20/2015, 2:09 PM  LOS: 3 days

## 2015-03-20 NOTE — Progress Notes (Signed)
Speech Language Pathology Treatment: Dysphagia  Patient Details Name: Terrance JacquetCharles E Vanhook MRN: [id number] DOB: 02/05/1922 Today's Date: 03/20/2015 Time: 0912-0927 SLP Time Calculation (min) (ACUTE ONLY): 15 min  Assessment / Plan / Recommendation Clinical Impression  Skilled treatment session focused on addressing dysphagia goals.  SLP provided set-up and skilled observation of patient self-feeding Dys.3 textures and nectar-thick liquids via cup.  Patient utilized at fast rate of intake which resulted in oral residue and delayed intermittent coughs.  SLP facilitated session with Mod verbal and tactile cues to slow pace which allowed for improved oral clearance and time for a second swallow which eliminated further overt s/s of aspiration.  Patient was handed off to nurse tech and swallow strategies were reviewed.  Recommend follow up x1 to ensure tolerance prior to discharge from acute SLP services.      HPI HPI: 81 y.o. male with a past medical history of dementia, frequent UTIs, anemia admitted for evaluation of altered mental status and fever. MD work up does NOT reveal a UTI. CXR 12/17 no evidence of acute abnormality. BSE 12/18/13 recommended Dys 1, nectar liquids.      SLP Plan  Continue with current plan of care     Recommendations  Diet recommendations: Dysphagia 3 (mechanical soft);Nectar-thick liquid Liquids provided via: Cup;No straw Medication Administration: Whole meds with liquid Supervision: Patient able to self feed;Full supervision/cueing for compensatory strategies Compensations: Slow rate;Small sips/bites;Minimize environmental distractions Postural Changes and/or Swallow Maneuvers: Out of bed for meals;Seated upright 90 degrees              Oral Care Recommendations: Oral care BID Follow up Recommendations: 24 hour supervision/assistance;Skilled Nursing facility Plan: Continue with current plan of care  Charlane FerrettiMelissa Umer Harig, M.A.,  CCC-SLP [phone]  Lowell Mcgurk 03/20/2015, 9:30 AM

## 2015-03-20 NOTE — Care Management Note (Signed)
Case Management Note  Patient Details  Name: Terrance Adams MRN: [id number] Date of Birth: 1921-04-06  Subjective/Objective:                  Patient received from [facility]. Patient is active with [facility] for Hospice Care. Patient is to have Palliative Consult per Dr Susie CassetteAbrol tomorrow, MOST form to be addressed at this time. Patient to DC to ALF with Hospice tomorrow. Spoke with Erick Colaceharles Alston [facility] liaison who visited with patient today and yesterday. He believed patient was at same level of care as he was prior to admission, and did not forsee additional care needs. He believed patient to be appropriate to return to ALF with hospice care. This information provided to Dr Susie CassetteAbrol.    Action/Plan:  Expect DC to ALF with hospice tomorrow.  Expected Discharge Date:                  Expected Discharge Plan:  Assisted Living / Rest Home (With [facility] for Hospice Care)  In-House Referral:  Clinical Social Work  Discharge planning Services  CM Consult  Post Acute Care Choice:    Choice offered to:     DME Arranged:    DME Agency:     HH Arranged:    HH Agency:     Status of Service:  Completed, signed off  Medicare Important Message Given:    Date Medicare IM Given:    Medicare IM give by:    Date Additional Medicare IM Given:    Additional Medicare Important Message give by:     If discussed at Long Length of Stay Meetings, dates discussed:    Additional Comments:  Lawerance SabalDebbie Tamikia Chowning, RN 03/20/2015, 2:44 PM

## 2015-03-20 NOTE — Progress Notes (Signed)
Physical Therapy Treatment Patient Details Name: Terrance Adams MRN: [id number] DOB: [birth year] Today's Date: 03/20/2015    History of Present Illness Pt adm with fever and AMS and found to have UTI. PMH - dementia, recurrent UTIs    PT Comments    Pt able to amb with assist and rolling walker. Expect pt close to his baseline. Recommend he return to the memory care unit he came from. Will follow acutely.  Follow Up Recommendations  No PT follow up (Return to memory care unit)     Equipment Recommendations  None recommended by PT    Recommendations for Other Services       Precautions / Restrictions Precautions Precautions: Fall Restrictions Weight Bearing Restrictions: No    Mobility  Bed Mobility Overal bed mobility: Needs Assistance Bed Mobility: Supine to Sit     Supine to sit: Mod assist     General bed mobility comments: Assist to bring legs over and elevate trunk.  Transfers Overall transfer level: Needs assistance Equipment used: Rolling walker (2 wheeled) Transfers: Sit to/from Stand Sit to Stand: Mod assist         General transfer comment: Assist to bring hips up and for balance  Ambulation/Gait Ambulation/Gait assistance: Min assist Ambulation Distance (Feet): 170 Feet Assistive device: Rolling walker (2 wheeled) Gait Pattern/deviations: Step-through pattern;Decreased step length - right;Decreased step length - left;Trunk flexed     General Gait Details: Assist with balance and to correct pt veering rt.   Stairs            Wheelchair Mobility    Modified Rankin (Stroke Patients Only)       Balance Overall balance assessment: Needs assistance Sitting-balance support: No upper extremity supported;Feet supported Sitting balance-Leahy Scale: Fair     Standing balance support: Bilateral upper extremity supported Standing balance-Leahy Scale: Poor Standing balance comment: walker and min guard assist for static standing                     Cognition Arousal/Alertness: Awake/alert Behavior During Therapy: WFL for tasks assessed/performed Overall Cognitive Status: No family/caregiver present to determine baseline cognitive functioning                      Exercises      General Comments        Pertinent Vitals/Pain Pain Assessment: No/denies pain    Home Living Family/patient expects to be discharged to:: Assisted living             Home Equipment: Dan Humphreys - 2 wheels Additional Comments: Per ED notes pt from [facility] memory care unit.     Prior Function        Comments: Unsure of  details of functional level. Is ambulatory.   PT Goals (current goals can now be found in the care plan section) Acute Rehab PT Goals Patient Stated Goal: Not stated PT Goal Formulation: Patient unable to participate in goal setting Time For Goal Achievement: 03/27/15 Potential to Achieve Goals: Good    Frequency  Min 3X/week    PT Plan      Co-evaluation             End of Session Equipment Utilized During Treatment: Gait belt Activity Tolerance: Patient tolerated treatment well Patient left: in chair;with call bell/phone within reach;with chair alarm set;Other (comment) (SLP present)     Time: 8657-8469 PT Time Calculation (min) (ACUTE ONLY): 19 min  Charges:  G Codes:      De Libman 03/20/2015, 9:28 AM [facility] PT [phone]

## 2015-03-20 NOTE — Evaluation (Signed)
Occupational Therapy Evaluation and Discharge Patient Details Name: Terrance JacquetCharles E Helzer MRN: [id number] DOB: 08/09/1921 Today's Date: 03/20/2015    History of Present Illness Pt adm with fever and AMS and found to have UTI. PMH - dementia, recurrent UTIs   Clinical Impression   Pt [facility]. Per son, he primarily sits in a w/c due to multiple falls.  Pt is dependent in ADL except self feeding. Pt with waxing and waning of cognition per son which is quite upsetting to the son. Pt may benefit from a palliative care consult to assist son in understanding pt's disease process and trajectory.  No acute OT needs.  Requested staff encourage pt to self feed with supervision.  Signing off.    Follow Up Recommendations  Supervision/Assistance - 24 hour (at ALF)    Equipment Recommendations       Recommendations for Other Services       Precautions / Restrictions Precautions Precautions: Fall Precaution Comments: pt with hx of falls, primarily stays in w/c at facility Restrictions Weight Bearing Restrictions: No      Mobility Bed Mobility      General bed mobility comments: pt in chair  Transfers Overall transfer level: Needs assistance Equipment used: Rolling walker (2 wheeled) Transfers: Sit to/from Stand Sit to Stand: Mod assist         General transfer comment: Assist to bring hips up and for balance    Balance Overall balance assessment: Needs assistance Sitting-balance support: No upper extremity supported;Feet supported Sitting balance-Leahy Scale: Fair     Standing balance support: Bilateral upper extremity supported Standing balance-Leahy Scale: Poor                             ADL Overall ADL's : At baseline                                       General ADL Comments: Asked that staff encourage pt to self feed with supervision.     Vision Additional Comments: glasses are not here   Perception      Praxis      Pertinent Vitals/Pain Pain Assessment: Faces Faces Pain Scale: No hurt     Hand Dominance Right   Extremity/Trunk Assessment Upper Extremity Assessment Upper Extremity Assessment: Generalized weakness (R rotator cuff tear per son)   Lower Extremity Assessment Lower Extremity Assessment: Generalized weakness       Communication Communication Communication: HOH   Cognition Arousal/Alertness: Awake/alert Behavior During Therapy: WFL for tasks assessed/performed Overall Cognitive Status: History of cognitive impairments - at baseline (per son pt's cognition waxes and wanes)                     General Comments       Exercises       Shoulder Instructions      Home Living Family/patient expects to be discharged to:: Assisted living ([facility])                             Home Equipment: Walker - 2 wheels   Additional Comments: Per son, pt from [facility].      Prior Functioning/Environment Level of Independence: Needs assistance  Gait / Transfers Assistance Needed: Pt primarily in w/c at ALF. ADL's /  Homemaking Assistance Needed: Pt is dependent in bathing, dressing, grooming, able to feed himself per son.   Comments: Son reports frequent falls and that facility "won't let him walk"    OT Diagnosis: Generalized weakness;Cognitive deficits   OT Problem List:     OT Treatment/Interventions:      OT Goals(Current goals can be found in the care plan section) Acute Rehab OT Goals Patient Stated Goal: Not stated  OT Frequency:     Barriers to D/C:            Co-evaluation              End of Session Equipment Utilized During Treatment: Gait belt;Rolling walker  Activity Tolerance: Patient tolerated treatment well Patient left: in chair;with call bell/phone within reach;with chair alarm set;with family/visitor present;with restraints reapplied   Time: 1009-1031 OT Time Calculation (min): 22  min Charges:  OT General Charges $OT Visit: 1 Procedure OT Evaluation $Initial OT Evaluation Tier I: 1 Procedure G-Codes:    Evern Bio 03/20/2015, 10:32 AM  [phone]

## 2015-03-20 NOTE — Care Management Important Message (Signed)
Important Message  Patient Details  Name: Terrance Adams MRN: [id number] Date of Birth: June 30, 1921   Medicare Important Message Given:  Yes    Kyla BalzarineShealy, Marks Scalera Abena 03/20/2015, 4:48 PM

## 2015-03-21 LAB — GLUCOSE, CAPILLARY
GLUCOSE-CAPILLARY: 100 mg/dL — AB (ref 65–99)
GLUCOSE-CAPILLARY: 127 mg/dL — AB (ref 65–99)
Glucose-Capillary: 99 mg/dL (ref 65–99)

## 2015-03-21 MED ORDER — RESOURCE THICKENUP CLEAR PO POWD: ORAL | Status: AC

## 2015-03-21 MED ORDER — ONDANSETRON HCL 4 MG PO TABS: 4.0000 mg | ORAL_TABLET | Freq: Four times a day (QID) | ORAL | Status: AC | PRN

## 2015-03-21 NOTE — NC FL2 (Signed)
[place] MEDICAID [name] LEVEL OF CARE SCREENING TOOL     IDENTIFICATION  Patient Name: Terrance Adams Birthdate: July 03, 1921 Sex: male Admission Date (Current Location): 03/17/2015  [facility] and [place] Number:  Producer, television/film/video and Address:  [facility], [address] [place], [place] [address]      Provider Number: [id number]  Attending Physician Name and Address:  Richarda Overlie, MD  Relative Name and Phone Number:      Demetreus, Lothamer [phone]  [phone]      Current Level of Care: Hospital Recommended Level of Care: Assisted Living Facility Prior Approval Number:    Date Approved/Denied:   PASRR Number:    Discharge Plan: Other (Comment) (Return to ALF)    Current Diagnoses: Patient Active Problem List   Diagnosis Date Noted  . Abdominal pain   . Acute encephalopathy 03/17/2015  . History of dementia 03/17/2015  . Altered mental state 04/24/2014  . Acute confusional state secondary to UTI 12/17/2013  . Weakness 10/08/2013  . Rhabdomyolysis 10/08/2013  . Leukocytosis 10/08/2013  . Knee pain, bilateral 10/08/2013  . Normocytic anemia 10/08/2013  . Altered mental status 03/28/2011  . UTI (lower urinary tract infection) 03/28/2011  . UNSPECIFIED HEARING LOSS 01/04/2010  . PULMONARY NODULE 01/04/2010  . HYPERTROPHY PROSTATE W/UR OBST & OTH LUTS 01/04/2010  . ABNORMAL ELECTROCARDIOGRAM 01/04/2010    Orientation RESPIRATION BLADDER Height & Weight       Normal incontinent   126 lbs.  BEHAVIORAL SYMPTOMS/MOOD NEUROLOGICAL BOWEL NUTRITION STATUS      Continent  (Carb modified)  AMBULATORY STATUS COMMUNICATION OF NEEDS Skin     Verbally Normal                       Personal Care Assistance Level of Assistance  Bathing, Feeding, Dressing Bathing Assistance: Limited assistance Feeding assistance: Limited assistance Dressing Assistance: Limited assistance     Functional Limitations Info             SPECIAL  CARE FACTORS FREQUENCY                     Contractures      Additional Factors Info  Code Status, Allergies, Insulin Sliding Scale Code Status Info: DNR Allergies Info: NKA   Insulin Sliding Scale Info: insulin aspart (novoLOG) injection 0-9 Units 3 times daily w/ meals       Current Medications (03/21/2015):  This is the current hospital active medication list Current Facility-Administered Medications  Medication Dose Route Frequency Provider Last Rate Last Dose  . 0.9 %  sodium chloride infusion   Intravenous Continuous Richarda Overlie, MD 100 mL/hr at 03/21/15 1049    . acetaminophen (TYLENOL) tablet 650 mg  650 mg Oral Q6H PRN Bobette Mo, MD       Or  . acetaminophen (TYLENOL) suppository 650 mg  650 mg Rectal Q6H PRN Bobette Mo, MD      . enoxaparin (LOVENOX) injection 40 mg  40 mg Subcutaneous Q24H Bobette Mo, MD   40 mg at 03/20/15 2243  . insulin aspart (novoLOG) injection 0-9 Units  0-9 Units Subcutaneous TID WC Richarda Overlie, MD   1 Units at 03/21/15 1313  . ondansetron (ZOFRAN) tablet 4 mg  4 mg Oral Q6H PRN Bobette Mo, MD       Or  . ondansetron [facility]) injection 4 mg  4 mg Intravenous Q6H PRN Bobette Mo,  MD      . Lequita AsalESOURCE THICKENUP CLEAR   Oral PRN Richarda OverlieNayana Abrol, MD      . sodium chloride 0.9 % injection 3 mL  3 mL Intravenous Q12H Bobette Moavid Manuel Ortiz, MD   3 mL at 03/18/15 2119     Discharge Medications: Please see discharge summary for a list of discharge medications.  Relevant Imaging Results:  Relevant Lab Results:   Additional Information    Tisheena Maguire, Ervin Knackric R, LCSWA

## 2015-03-21 NOTE — Progress Notes (Signed)
Patient D/C to [facility] via transport.  No acute distress noted

## 2015-03-21 NOTE — Discharge Summary (Addendum)
Physician Discharge Summary  Terrance Adams MRN: [id number] DOB/AGE: 1921/04/18 81 y.o.  PCP: Sheela Stack, MD   Admit date: 03/17/2015 Discharge date: 03/21/2015  Discharge Diagnoses:     Principal Problem:   Altered mental status Active Problems:   Leukocytosis   Normocytic anemia   History of dementia   Abdominal pain DNR    Follow-up recommendations Follow-up with PCP in 3-5 days , including all  additional recommended appointments as below Follow-up CBC, CMP in 3-5 days Resume hospice with [facility] and complete the MOST form upon return to clarify if  Patient desires future hospitalizations   Diet recommendations: Dysphagia 3 (mechanical soft);Nectar-thick liquid Liquids provided via: Cup;No straw Medication Administration: Whole meds with liquid Supervision: Patient able to self feed;Full supervision/cueing for compensatory strategies Compensations: Slow rate;Small sips/bites;Minimize environmental distractions Postural Changes and/or Swallow Maneuvers: Out of bed for meals;Seated upright 90 degrees     Medication List    STOP taking these medications        IMODIUM PO      TAKE these medications        acetaminophen 325 MG tablet  Commonly known as:  TYLENOL  Take 2 tablets (650 mg total) by mouth every 4 (four) hours as needed for mild pain, fever or headache.     cyanocobalamin 500 MCG tablet  Take 1 tablet (500 mcg total) by mouth daily.     DECUBI-VITE PO  Take 1 tablet by mouth daily.     feeding supplement (ENSURE COMPLETE) Liqd  Take 237 mLs by mouth 3 (three) times daily between meals.     FEOSOL 325 (65 FE) MG tablet  Generic drug:  ferrous sulfate  Take 325 mg by mouth at bedtime. Gluten free     nitrofurantoin 50 MG capsule  Commonly known as:  MACRODANTIN  Take 50 mg by mouth at bedtime.     ondansetron 4 MG tablet  Commonly known as:  ZOFRAN  Take 1 tablet (4 mg total) by mouth every 6 (six) hours as needed for nausea.      RESOURCE THICKENUP CLEAR Powd  One can     senna-docusate 8.6-50 MG tablet  Commonly known as:  Senokot-S  Take 1 tablet by mouth at bedtime.     traZODone 50 MG tablet  Commonly known as:  DESYREL  Take 0.5 tablets (25 mg total) by mouth at bedtime as needed for sleep.         Discharge Condition:   Discharge Instructions       Discharge Instructions    Diet - low sodium heart healthy    Complete by:  As directed      Increase activity slowly    Complete by:  As directed            No Known Allergies    Disposition: 01-Home or Self Care   Consults:     Significant Diagnostic Studies:  Dg Chest 2 View  03/19/2015  CLINICAL DATA:  81 year old with current history of dementia and anemia who has fever and leukocytosis and acute mental status changes similar to that with prior episodes of urosepsis. EXAM: CHEST  2 VIEW COMPARISON:  03/17/2015 and earlier. FINDINGS: Cardiac silhouette moderately to markedly enlarged, unchanged. Thoracic aorta atherosclerotic, unchanged. Chronic elevation of the left hemidiaphragm and chronic scar/atelectasis in the left lower lobe and lingula. Lungs otherwise clear. No localized airspace consolidation. No pleural effusions. No pneumothorax. Normal pulmonary vascularity. Multiple thoracic spine compression fractures as noted on  prior examinations with generalized osseous demineralization. IMPRESSION: No acute cardiopulmonary disease. Stable cardiomegaly. Stable chronic scar/atelectasis in the left lower lobe and lingular related to elevation of the left hemidiaphragm. Electronically Signed   By: Evangeline Dakin M.D.   On: 03/19/2015 18:54   Ct Head Wo Contrast  03/01/2015  CLINICAL DATA:  81 year old male with fall. EXAM: CT HEAD WITHOUT CONTRAST TECHNIQUE: Contiguous axial images were obtained from the base of the skull through the vertex without intravenous contrast. COMPARISON:  CT dated 02/07/2015 FINDINGS: The ventricles are  dilated and the sulci are prominent compatible with age-related atrophy. Periventricular and deep white matter hypodensities represent chronic microvascular ischemic changes. There is no intracranial hemorrhage. No mass effect or midline shift identified. A 1.5 cm left maxillary sinus retention cyst or polyp. The visualized paranasal sinuses and mastoid air cells are otherwise well aerated. The calvarium is intact. IMPRESSION: No acute intracranial pathology. Age-related atrophy and chronic microvascular ischemic disease. Electronically Signed   By: Anner Crete M.D.   On: 03/01/2015 20:26   Ct Abdomen Pelvis W Contrast  03/19/2015  CLINICAL DATA:  Abdominal pain.  Dementia. EXAM: CT ABDOMEN AND PELVIS WITH CONTRAST TECHNIQUE: Multidetector CT imaging of the abdomen and pelvis was performed using the standard protocol following bolus administration of intravenous contrast. CONTRAST:  58m OMNIPAQUE IOHEXOL 300 MG/ML  SOLN COMPARISON:  11/06/2014 FINDINGS: Lower chest and abdominal wall:  Symmetric gynecomastia. Chronic elevation the left diaphragm. New small bilateral pleural effusion with atelectasis dependently. Moderate aortic valve calcifications/sclerosis. Fatty left inguinal hernia. Gas in the right lower quadrant subcutaneous fat around the abdominal wall musculature. Although gas has a roughly linear morphology, appearance not typical of intravenous gas. There is no surrounding cellulitic change. Hepatobiliary: No focal liver abnormality.No evidence of biliary obstruction or stone. Pancreas: Unremarkable. Spleen: Unremarkable. Adrenals/Urinary Tract: Negative adrenals. Renal cortical thinning. No hydronephrosis. Chronic circumferential bladder wall thickening, likely from outlet obstruction. Reproductive:Negative for age. Stomach/Bowel: Moderate rectal stool volume without wall thickening. No bowel obstruction. Extensive distal colonic diverticulosis. No appendicitis. Vascular/Lymphatic: No acute  vascular abnormality. No mass or adenopathy. Peritoneal: Small volume ascites, likely volume overload given the pleural fluid. Musculoskeletal: L2 compression fracture with horizontal sclerotic fracture lines that is new from previous. There is no associated height loss. New T11 compression fracture with height loss greater than 75%. The fracture plane is still readily evident, but there is surrounding sclerosis suggesting an element of chronicity. Mild retropulsion without significant canal stenosis. Remote T8 compression fracture with advanced height loss. IMPRESSION: 1. No acute intra-abdominal finding. 2. Small pleural effusions with dependent atelectasis. 3. Right abdominal wall gas is likely from medication injection, but deeper than typically seen. Correlate with skin exam. 4. Non acute but unhealed T11 compression fracture with advanced height loss. 5. Healing L2 body fracture without height loss. Electronically Signed   By: JMonte FantasiaM.D.   On: 03/19/2015 02:50   Dg Chest Port 1 View  03/17/2015  CLINICAL DATA:  81year old male with sepsis and altered mental status. EXAM: PORTABLE CHEST 1 VIEW COMPARISON:  02/07/2015 and prior radiographs FINDINGS: Cardiomediastinal silhouette is unchanged. Bibasilar scarring is again noted. No definite airspace disease, pleural effusion or pneumothorax. Elevation of the left hemidiaphragm is again noted. IMPRESSION: No evidence of acute abnormality. Electronically Signed   By: JMargarette CanadaM.D.   On: 03/17/2015 16:58       Filed Weights   03/17/15 1552  Weight: 57.153 kg (126 lb)     Microbiology: Recent  Results (from the past 240 hour(s))  Blood Culture (routine x 2)     Status: None (Preliminary result)   Collection Time: 03/17/15  4:02 PM  Result Value Ref Range Status   Specimen Description BLOOD RIGHT ANTECUBITAL  Final   Special Requests IN PEDIATRIC BOTTLE 1CC  Final   Culture NO GROWTH 4 DAYS  Final   Report Status PENDING  Incomplete   Blood Culture (routine x 2)     Status: None (Preliminary result)   Collection Time: 03/17/15  4:05 PM  Result Value Ref Range Status   Specimen Description BLOOD LEFT ANTECUBITAL  Final   Special Requests BOTTLES DRAWN AEROBIC AND ANAEROBIC 5CC  Final   Culture NO GROWTH 4 DAYS  Final   Report Status PENDING  Incomplete  Urine culture     Status: None   Collection Time: 03/17/15  5:16 PM  Result Value Ref Range Status   Specimen Description URINE, CATHETERIZED  Final   Special Requests NONE  Final   Culture NO GROWTH 2 DAYS  Final   Report Status 03/19/2015 FINAL  Final  MRSA PCR Screening     Status: None   Collection Time: 03/17/15 11:03 PM  Result Value Ref Range Status   MRSA by PCR NEGATIVE NEGATIVE Final    Comment:        The GeneXpert MRSA Assay (FDA approved for NASAL specimens only), is one component of a comprehensive MRSA colonization surveillance program. It is not intended to diagnose MRSA infection nor to guide or monitor treatment for MRSA infections.        Blood Culture    Component Value Date/Time   SDES URINE, CATHETERIZED 03/17/2015 1716   SPECREQUEST NONE 03/17/2015 1716   CULT NO GROWTH 2 DAYS 03/17/2015 1716   REPTSTATUS 03/19/2015 FINAL 03/17/2015 1716      Labs: Results for orders placed or performed during the hospital encounter of 03/17/15 (from the past 48 hour(s))  Glucose, capillary     Status: Abnormal   Collection Time: 03/19/15  4:05 PM  Result Value Ref Range   Glucose-Capillary 135 (H) 65 - 99 mg/dL  Glucose, capillary     Status: Abnormal   Collection Time: 03/19/15  9:58 PM  Result Value Ref Range   Glucose-Capillary 127 (H) 65 - 99 mg/dL   Comment 1 Notify RN    Comment 2 Document in Chart   CBC WITH DIFFERENTIAL     Status: Abnormal   Collection Time: 03/20/15  6:56 AM  Result Value Ref Range   WBC 10.7 (H) 4.0 - 10.5 K/uL   RBC 3.65 (L) 4.22 - 5.81 MIL/uL   Hemoglobin 11.2 (L) 13.0 - 17.0 g/dL   HCT 33.6 (L)  39.0 - 52.0 %   MCV 92.1 78.0 - 100.0 fL   MCH 30.7 26.0 - 34.0 pg   MCHC 33.3 30.0 - 36.0 g/dL   RDW 13.7 11.5 - 15.5 %   Platelets 286 150 - 400 K/uL   Neutrophils Relative % 60 %   Neutro Abs 6.4 1.7 - 7.7 K/uL   Lymphocytes Relative 19 %   Lymphs Abs 2.1 0.7 - 4.0 K/uL   Monocytes Relative 11 %   Monocytes Absolute 1.2 (H) 0.1 - 1.0 K/uL   Eosinophils Relative 9 %   Eosinophils Absolute 1.0 (H) 0.0 - 0.7 K/uL   Basophils Relative 1 %   Basophils Absolute 0.1 0.0 - 0.1 K/uL  Comprehensive metabolic panel     Status:  Abnormal   Collection Time: 03/20/15  6:56 AM  Result Value Ref Range   Sodium 139 135 - 145 mmol/L   Potassium 4.4 3.5 - 5.1 mmol/L   Chloride 110 101 - 111 mmol/L   CO2 20 (L) 22 - 32 mmol/L   Glucose, Bld 98 65 - 99 mg/dL   BUN 12 6 - 20 mg/dL   Creatinine, Ser 1.01 0.61 - 1.24 mg/dL   Calcium 8.4 (L) 8.9 - 10.3 mg/dL   Total Protein 5.5 (L) 6.5 - 8.1 g/dL   Albumin 2.5 (L) 3.5 - 5.0 g/dL   AST 20 15 - 41 U/L   ALT 19 17 - 63 U/L   Alkaline Phosphatase 95 38 - 126 U/L   Total Bilirubin 0.6 0.3 - 1.2 mg/dL   GFR calc non Af Amer >60 >60 mL/min   GFR calc Af Amer >60 >60 mL/min    Comment: (NOTE) The eGFR has been calculated using the CKD EPI equation. This calculation has not been validated in all clinical situations. eGFR's persistently <60 mL/min signify possible Chronic Kidney Disease.    Anion gap 9 5 - 15  Glucose, capillary     Status: None   Collection Time: 03/20/15  8:25 AM  Result Value Ref Range   Glucose-Capillary 90 65 - 99 mg/dL  Glucose, capillary     Status: Abnormal   Collection Time: 03/20/15 11:55 AM  Result Value Ref Range   Glucose-Capillary 117 (H) 65 - 99 mg/dL  Glucose, capillary     Status: Abnormal   Collection Time: 03/20/15  4:55 PM  Result Value Ref Range   Glucose-Capillary 162 (H) 65 - 99 mg/dL  Glucose, capillary     Status: Abnormal   Collection Time: 03/20/15 10:06 PM  Result Value Ref Range    Glucose-Capillary 132 (H) 65 - 99 mg/dL  Glucose, capillary     Status: Abnormal   Collection Time: 03/21/15  7:48 AM  Result Value Ref Range   Glucose-Capillary 100 (H) 65 - 99 mg/dL  Glucose, capillary     Status: Abnormal   Collection Time: 03/21/15 11:45 AM  Result Value Ref Range   Glucose-Capillary 127 (H) 65 - 99 mg/dL     Lipid Panel     Component Value Date/Time   CHOL 180 04/25/2014 0445   TRIG 79 04/25/2014 0445   HDL 57 04/25/2014 0445   CHOLHDL 3.2 04/25/2014 0445   VLDL 16 04/25/2014 0445   LDLCALC 107* 04/25/2014 0445     Lab Results  Component Value Date   HGBA1C 6.6* 03/19/2015   HGBA1C 6.8* 03/18/2015   HGBA1C 6.4* 12/18/2013     Lab Results  Component Value Date   LDLCALC 107* 04/25/2014   CREATININE 1.01 03/20/2015    HPI: Terrance Adams is a 81 y.o. male with a past medical history of dementia, frequent UTIs, anemia who was referred from his nursing home facility for evaluation of altered mental status and fever. Apparently the patient has been having worsening dementia symptoms for the past 3 days, similar to the symptoms that he gets when he becomes uroseptic. No family member available and the patient is unable to provide further history at this time.   Assessment and plan Altered mental status, likely toxic metabolic encephalopathy Afebrile last 24 hours, No UTI, chest x-ray negative, repeat chest x-ray negative, speech therapy evaluation to rule out aspiration, they recommended dysphagia 3 diet with nectar thick liquids. CT head without any acute intracranial  pathology Patient started on vancomycin and Zosyn empirically, received 3 days of broad-spectrum antibiotics, blood culture urine culture negative so far, cannot rule out viral URI  Altered mental status could be secondary to dehydration/elevated lactate in the setting of viral URI Influenza PCR negative Patient complaining of abdominal pain this is likely referred pain, secondary to  unhealed T11/L2 fracture Continue Tylenol as needed    Leukocytosis Repeat chest x-ray negative, no source of infection, antibiotics discontinued Urine culture, blood culture no growth so far Recheck CBC if the patient desires to have lab work monitored frequently    Normocytic anemia Follow-up hematocrit and hemoglobin.   History of dementia Followed by hospice at ALF Continue medical management of sepsis as above. PT/OT eval, back to ALF with hospice  Hypoglycemia : Resolved,    Discharge Exam:    Blood pressure 147/95, pulse 68, temperature 98.8 F (37.1 C), temperature source Oral, resp. rate 17, height _0  (1.727 m), weight 57.153 kg (126 lb), SpO2 99 %.  General: No acute respiratory distress Lungs: Clear to auscultation bilaterally without wheezes or crackles Cardiovascular: Regular rate and rhythm without murmur gallop or rub normal S1 and S2 Abdomen: Nontender, nondistended, soft, bowel sounds positive, no rebound, no ascites, no appreciable mass Extremities: No significant cyanosis, clubbing, or edema bilateral lower extremities       Signed: Kersten Salmons 03/21/2015, 1:01 PM        Time spent >45 mins

## 2015-03-21 NOTE — Clinical Social Work Note (Signed)
[facility]  Patient and family agreeable to plans will transport via ems RN to call report, patient's brother Theodore DemarkJim Blanchet was notified at [phone]  Windell MouldingEric Damonte Frieson, MSW, Theresia MajorsLCSWA [phone]

## 2015-03-22 LAB — CULTURE, BLOOD (ROUTINE X 2)
CULTURE: NO GROWTH
Culture: NO GROWTH

## 2015-06-30 DEATH — deceased

## 2016-11-07 IMAGING — CT CT HEAD W/O CM
3 of 5 series · 15 of 47 positions shown, 18 images · non-contrast
Comparison: CT head and cervical spine 05/31/2014

CLINICAL DATA: Fall.

EXAM:
CT HEAD WITHOUT CONTRAST
CT CERVICAL SPINE WITHOUT CONTRAST
TECHNIQUE: Multidetector CT imaging of the head and cervical spine was
performed following the standard protocol without intravenous
contrast. Multiplanar CT image reconstructions of the cervical spine
were also generated.

[Series 602: axial · axial · 0.34mm/px · z∈[-294,-165]mm · 9 of 86 slices shown, 12 images]
[im 8/86  brain]
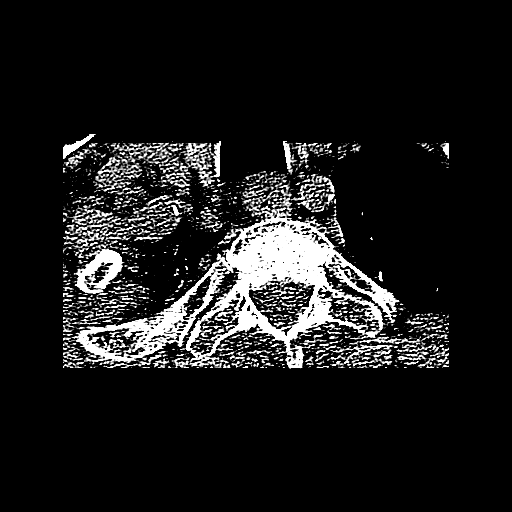
[im 8/86  bone]
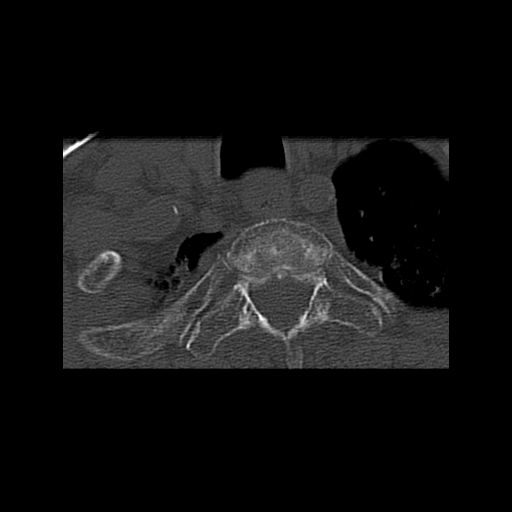
[im 16/86  brain]
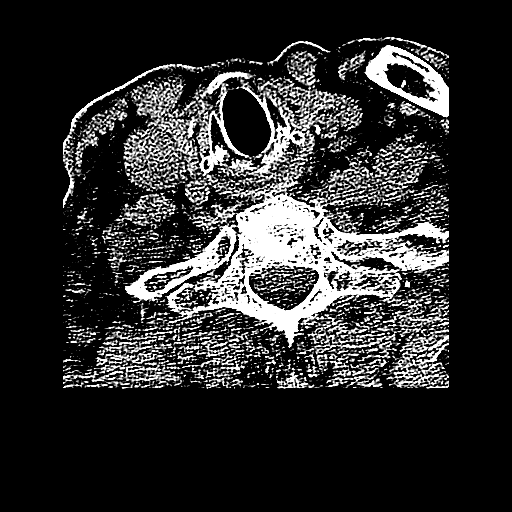
[im 24/86  brain]
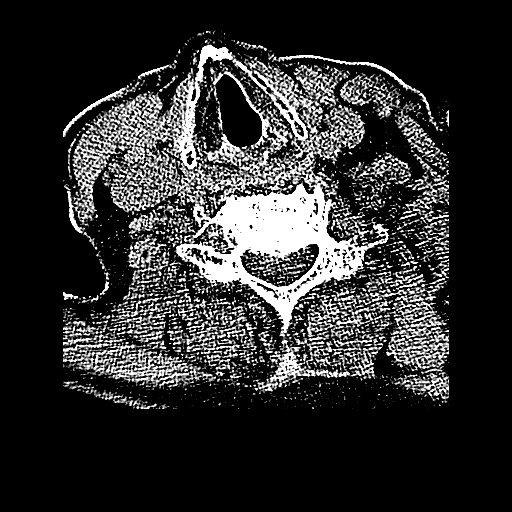
[im 31/86  brain]
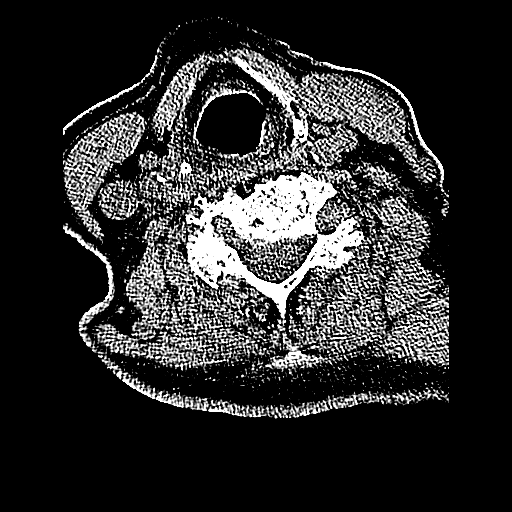
[im 47/86  brain]
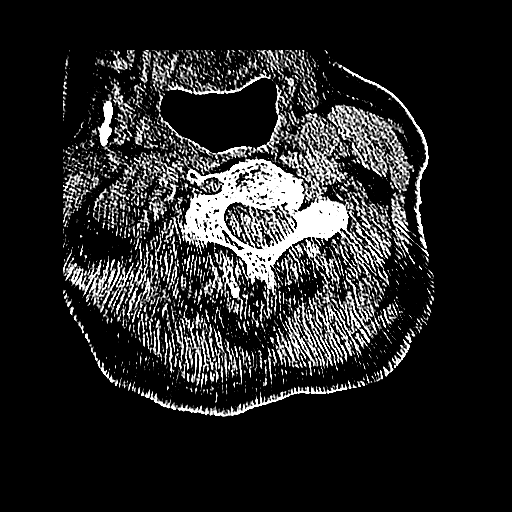
[im 47/86  bone]
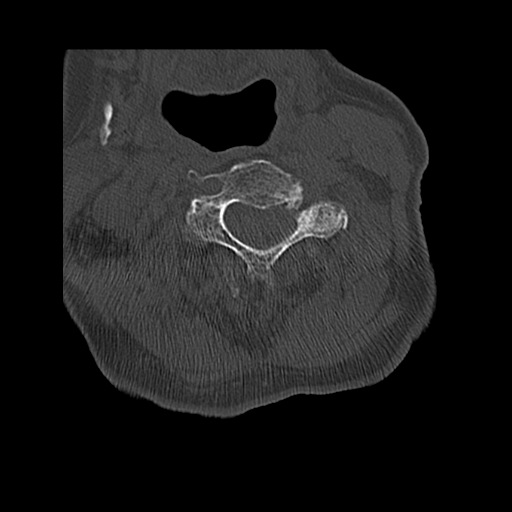
[im 55/86  brain]
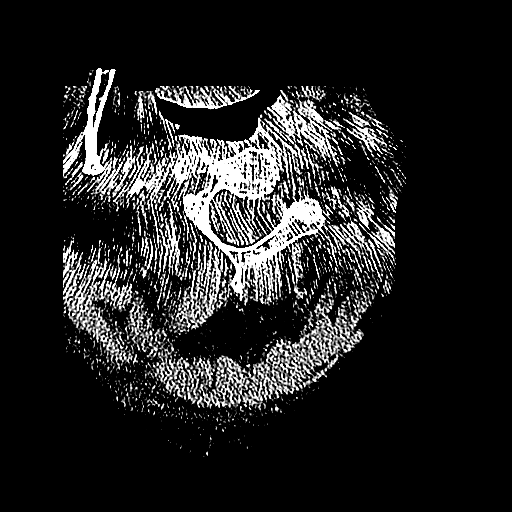
[im 62/86  brain]
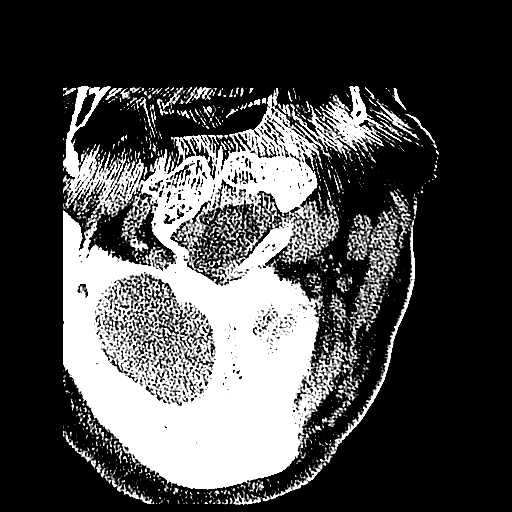
[im 70/86  brain]
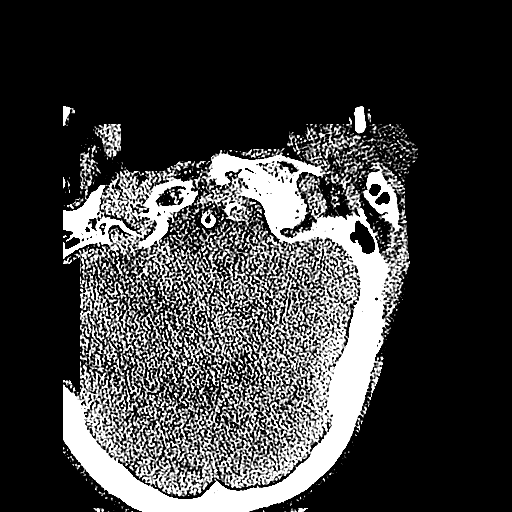
[im 78/86  brain]
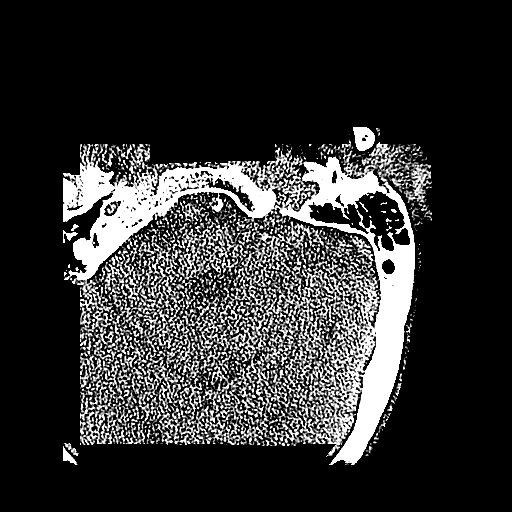
[im 78/86  bone]
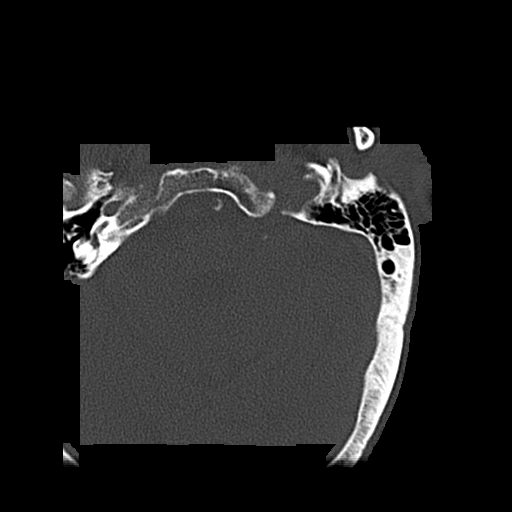

[Series 603: coronal · coronal · 0.34mm/px · 3 of 39 slices shown]
[im 13/39  brain]
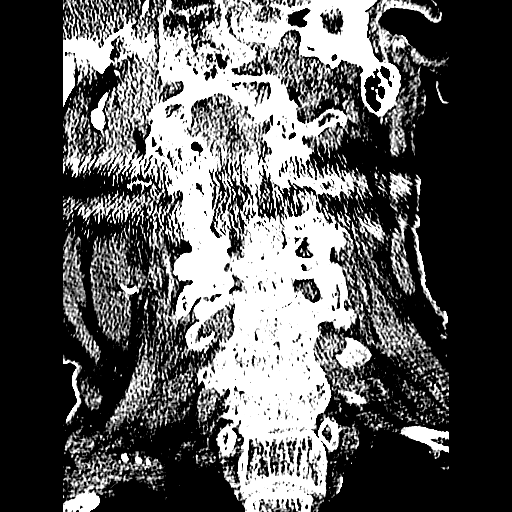
[im 17/39  brain]
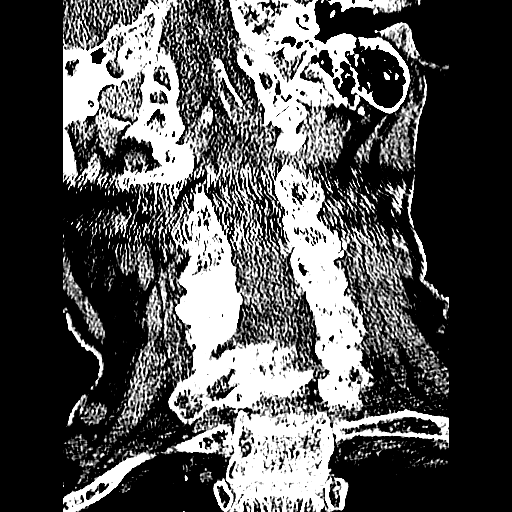
[im 22/39  brain]
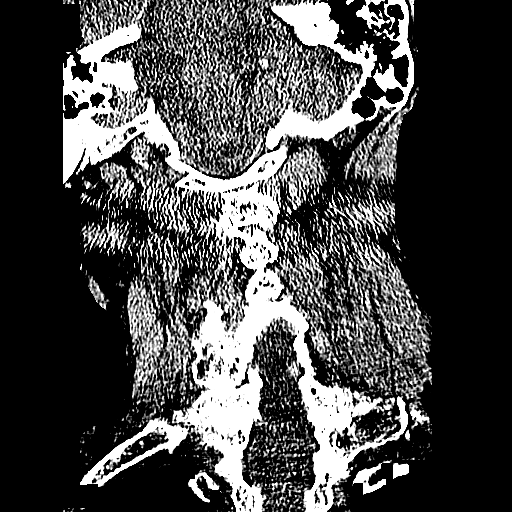

[Series 604: sagittal · sagittal · 0.34mm/px · 3 of 48 slices shown]
[im 16/48  brain]
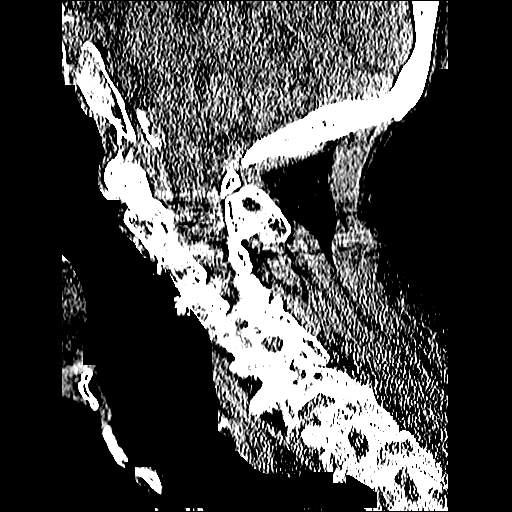
[im 24/48  brain]
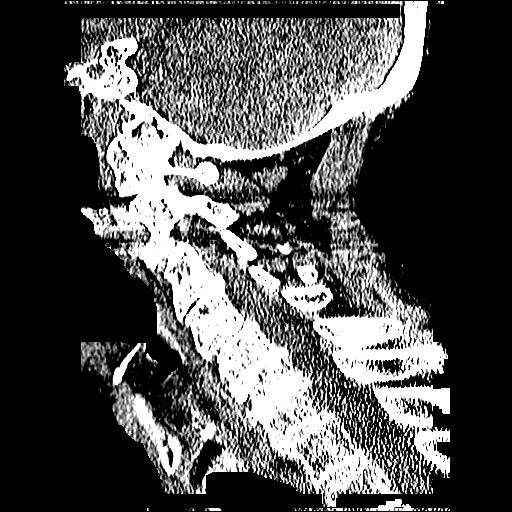
[im 32/48  brain]
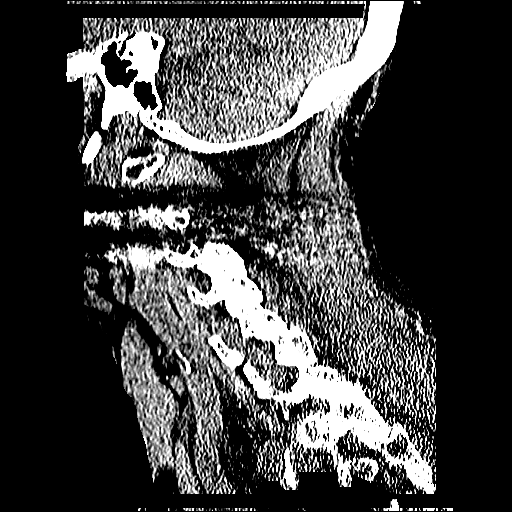

[15 of 47 positions shown; findings below may reference images not displayed]

FINDINGS: CT HEAD FINDINGS

Diffuse cerebral atrophy. Mild ventricular dilatation consistent
with central atrophy. Low-attenuation changes in the deep white
matter consistent with small vessel ischemia. Basal ganglia
calcifications. Small subcutaneous scalp hematoma over the left
frontal region. No mass effect or midline shift. No abnormal
extra-axial fluid collections. Gray-white matter junctions are
distinct. Basal cisterns are not effaced. No evidence of acute
intracranial hemorrhage. No depressed skull fractures. Mucosal
thickening throughout the paranasal sinuses. Retention cyst in the
left frontal sinus. Air-fluid level in the right maxillary antrum.
Prominent trabecular architecture of the right maxilla and sphenoid
bone with sclerosis in the mastoid air cells. This may represent
fibrous dysplasia. Vascular calcifications.

CT CERVICAL SPINE FINDINGS

Diffuse bone demineralization. Diffuse degenerative changes
throughout the cervical spine and facet joints as well as at C1 to.
Degenerative cysts in the base of the odontoid process. There is
mild anterior subluxation of C4 on C5 and of C7 on T1. Appearance is
similar to prior study and likely due to degenerative change. No
vertebral compression deformities. No prevertebral soft tissue
swelling. C1-2 articulation appears intact. No focal bone
destruction. Bone cortex appears intact. Vascular calcifications.
Scarring in the lung apices.
IMPRESSION: No acute intracranial abnormalities. Chronic atrophy and small
vessel ischemic changes. Bony changes on the right suggest fibrous
dysplasia.

Prominent diffuse degenerative change throughout the cervical spine.
Alignment is unchanged since prior study with mild anterior
subluxation of C4 on C5 and C7 on T1, likely degenerative. No acute
displaced fractures are identified.
